# Patient Record
Sex: Female | Born: 1935 | Race: White | Hispanic: No | Marital: Married | Smoking: Former smoker
Health system: Southern US, Community
[De-identification: ages and names within clinical notes are randomized; demographics above are authoritative.]

## PROBLEM LIST (undated history)

## (undated) DIAGNOSIS — E785 Hyperlipidemia, unspecified: Secondary | ICD-10-CM

## (undated) DIAGNOSIS — D239 Other benign neoplasm of skin, unspecified: Secondary | ICD-10-CM

## (undated) DIAGNOSIS — M8580 Other specified disorders of bone density and structure, unspecified site: Secondary | ICD-10-CM

## (undated) DIAGNOSIS — M1990 Unspecified osteoarthritis, unspecified site: Secondary | ICD-10-CM

## (undated) DIAGNOSIS — Z8489 Family history of other specified conditions: Secondary | ICD-10-CM

## (undated) DIAGNOSIS — Z8719 Personal history of other diseases of the digestive system: Secondary | ICD-10-CM

## (undated) DIAGNOSIS — T7840XA Allergy, unspecified, initial encounter: Secondary | ICD-10-CM

## (undated) DIAGNOSIS — G959 Disease of spinal cord, unspecified: Secondary | ICD-10-CM

## (undated) DIAGNOSIS — I10 Essential (primary) hypertension: Secondary | ICD-10-CM

## (undated) DIAGNOSIS — F419 Anxiety disorder, unspecified: Secondary | ICD-10-CM

## (undated) DIAGNOSIS — E119 Type 2 diabetes mellitus without complications: Principal | ICD-10-CM

## (undated) DIAGNOSIS — K219 Gastro-esophageal reflux disease without esophagitis: Secondary | ICD-10-CM

## (undated) DIAGNOSIS — H409 Unspecified glaucoma: Secondary | ICD-10-CM

## (undated) HISTORY — DX: Essential (primary) hypertension: I10

## (undated) HISTORY — DX: Type 2 diabetes mellitus without complications: E11.9

## (undated) HISTORY — DX: Hyperlipidemia, unspecified: E78.5

## (undated) HISTORY — DX: Unspecified glaucoma: H40.9

## (undated) HISTORY — DX: Gastro-esophageal reflux disease without esophagitis: K21.9

## (undated) HISTORY — DX: Anxiety disorder, unspecified: F41.9

## (undated) HISTORY — DX: Allergy, unspecified, initial encounter: T78.40XA

## (undated) HISTORY — DX: Other specified disorders of bone density and structure, unspecified site: M85.80

## (undated) HISTORY — DX: Unspecified osteoarthritis, unspecified site: M19.90

## (undated) HISTORY — DX: Other benign neoplasm of skin, unspecified: D23.9

## (undated) HISTORY — PX: SPINE SURGERY: SHX786

---

## 2002-01-10 ENCOUNTER — Other Ambulatory Visit: Admission: RE | Admit: 2002-01-10 | Discharge: 2002-01-10 | Payer: Self-pay | Admitting: Internal Medicine

## 2003-09-17 ENCOUNTER — Other Ambulatory Visit: Admission: RE | Admit: 2003-09-17 | Discharge: 2003-09-17 | Payer: Self-pay | Admitting: Family Medicine

## 2005-07-16 ENCOUNTER — Other Ambulatory Visit: Admission: RE | Admit: 2005-07-16 | Discharge: 2005-07-16 | Payer: Self-pay | Admitting: Internal Medicine

## 2005-07-16 ENCOUNTER — Ambulatory Visit: Payer: Self-pay | Admitting: Internal Medicine

## 2005-09-24 ENCOUNTER — Ambulatory Visit: Payer: Self-pay | Admitting: Internal Medicine

## 2006-02-17 ENCOUNTER — Ambulatory Visit: Payer: Self-pay | Admitting: Internal Medicine

## 2006-02-19 ENCOUNTER — Encounter: Admission: RE | Admit: 2006-02-19 | Discharge: 2006-02-19 | Payer: Self-pay | Admitting: Internal Medicine

## 2006-03-08 ENCOUNTER — Ambulatory Visit: Payer: Self-pay | Admitting: Gastroenterology

## 2006-03-14 LAB — HM COLONOSCOPY: HM Colonoscopy: NORMAL

## 2006-03-25 ENCOUNTER — Ambulatory Visit: Payer: Self-pay | Admitting: Gastroenterology

## 2006-09-23 ENCOUNTER — Other Ambulatory Visit: Admission: RE | Admit: 2006-09-23 | Discharge: 2006-09-23 | Payer: Self-pay | Admitting: Family Medicine

## 2006-09-23 ENCOUNTER — Ambulatory Visit: Payer: Self-pay | Admitting: Internal Medicine

## 2006-09-23 ENCOUNTER — Encounter: Payer: Self-pay | Admitting: Internal Medicine

## 2006-09-29 ENCOUNTER — Ambulatory Visit: Payer: Self-pay | Admitting: Internal Medicine

## 2007-09-28 ENCOUNTER — Ambulatory Visit: Payer: Self-pay | Admitting: Internal Medicine

## 2007-10-04 DIAGNOSIS — E785 Hyperlipidemia, unspecified: Secondary | ICD-10-CM

## 2007-10-04 DIAGNOSIS — I10 Essential (primary) hypertension: Secondary | ICD-10-CM

## 2007-10-04 DIAGNOSIS — J309 Allergic rhinitis, unspecified: Secondary | ICD-10-CM | POA: Insufficient documentation

## 2007-10-04 DIAGNOSIS — K219 Gastro-esophageal reflux disease without esophagitis: Secondary | ICD-10-CM | POA: Insufficient documentation

## 2007-10-05 ENCOUNTER — Ambulatory Visit: Payer: Self-pay | Admitting: Internal Medicine

## 2007-10-06 ENCOUNTER — Encounter (INDEPENDENT_AMBULATORY_CARE_PROVIDER_SITE_OTHER): Payer: Self-pay | Admitting: *Deleted

## 2007-10-20 IMAGING — CR DG HIP W/ PELVIS BILAT
5 series · 5 of 5 positions shown · non-contrast
Comparison: none

CLINICAL DATA: Osteoarthritis.  Pain in the hips.  No injury.
 BILATERAL HIPS AND PELVIS:

[view not recorded (1 of 5)]
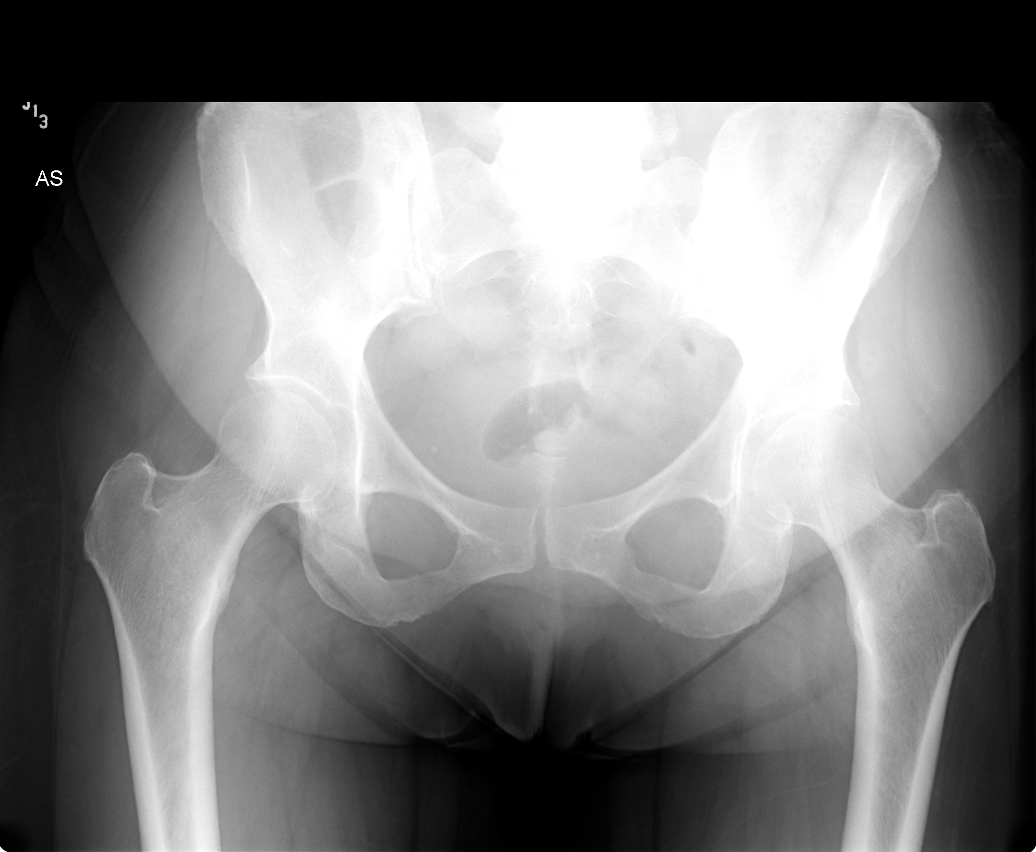

[view not recorded (2 of 5)]
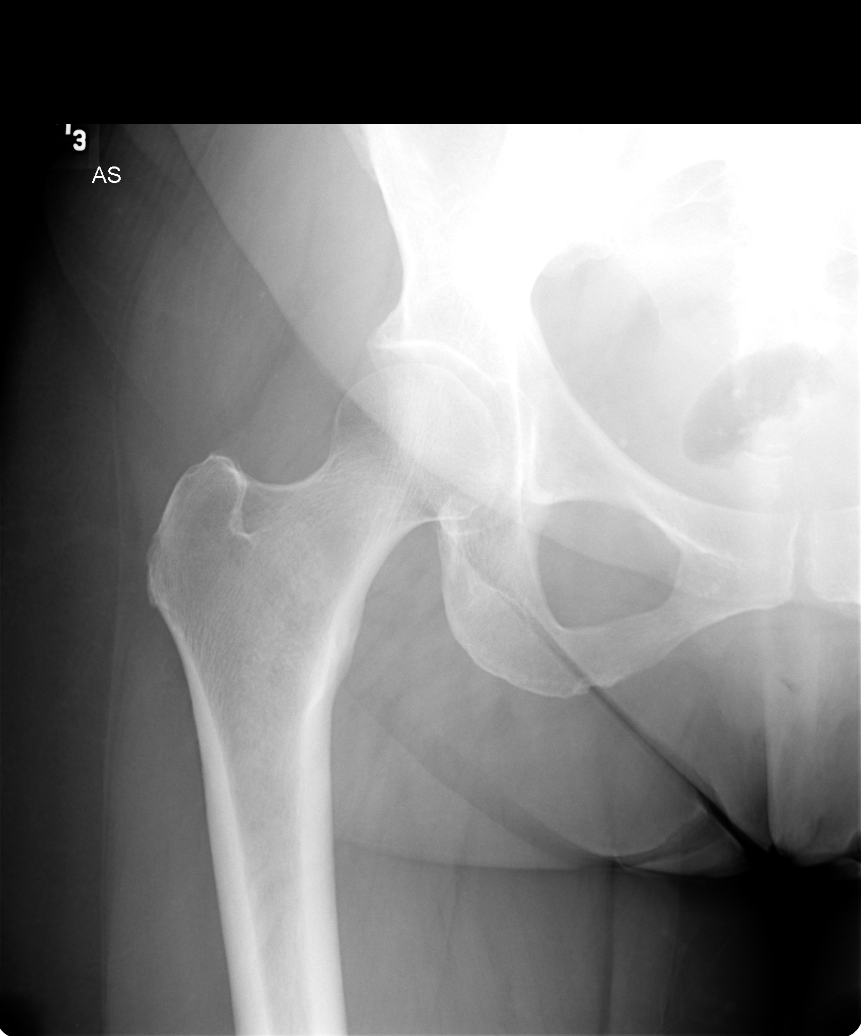

[view not recorded (3 of 5)]
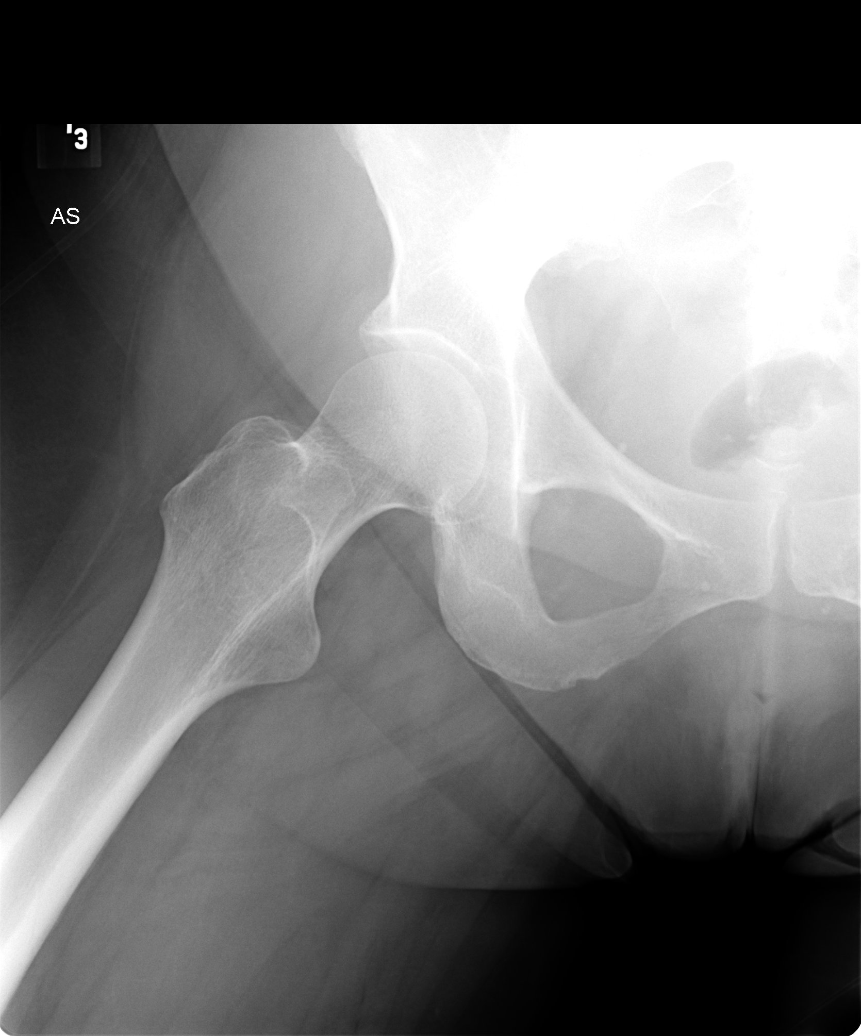

[view not recorded (4 of 5)]
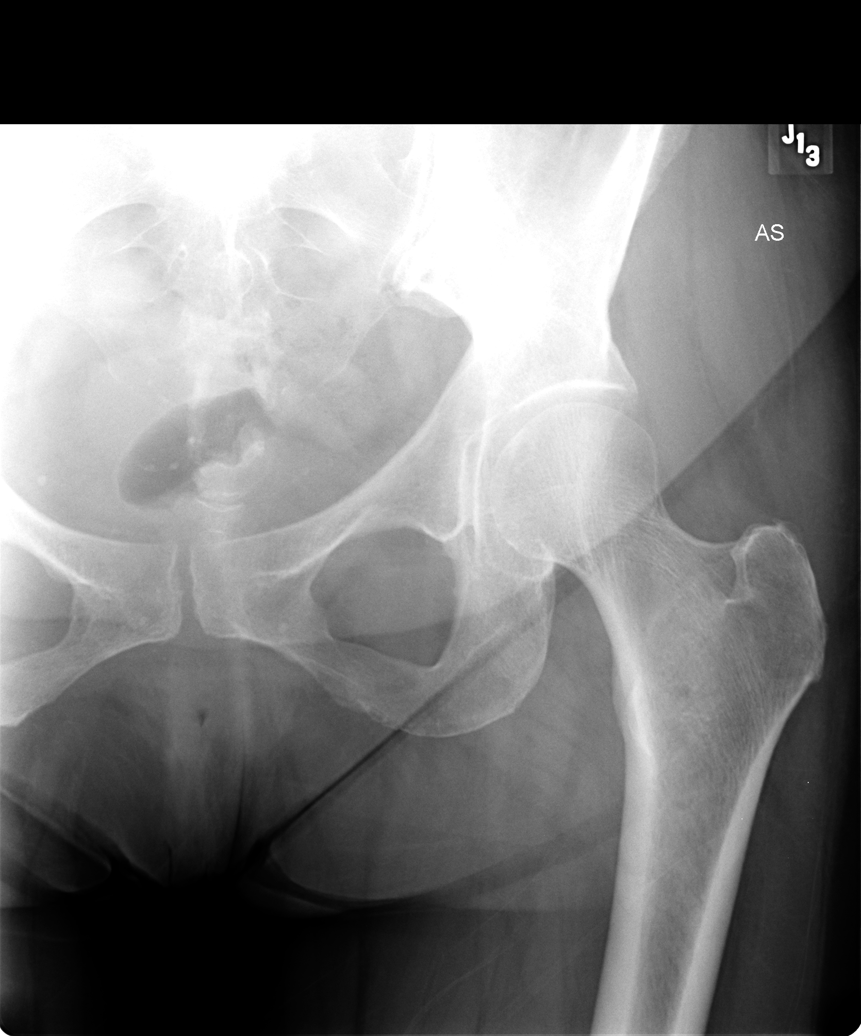

[view not recorded (5 of 5)]
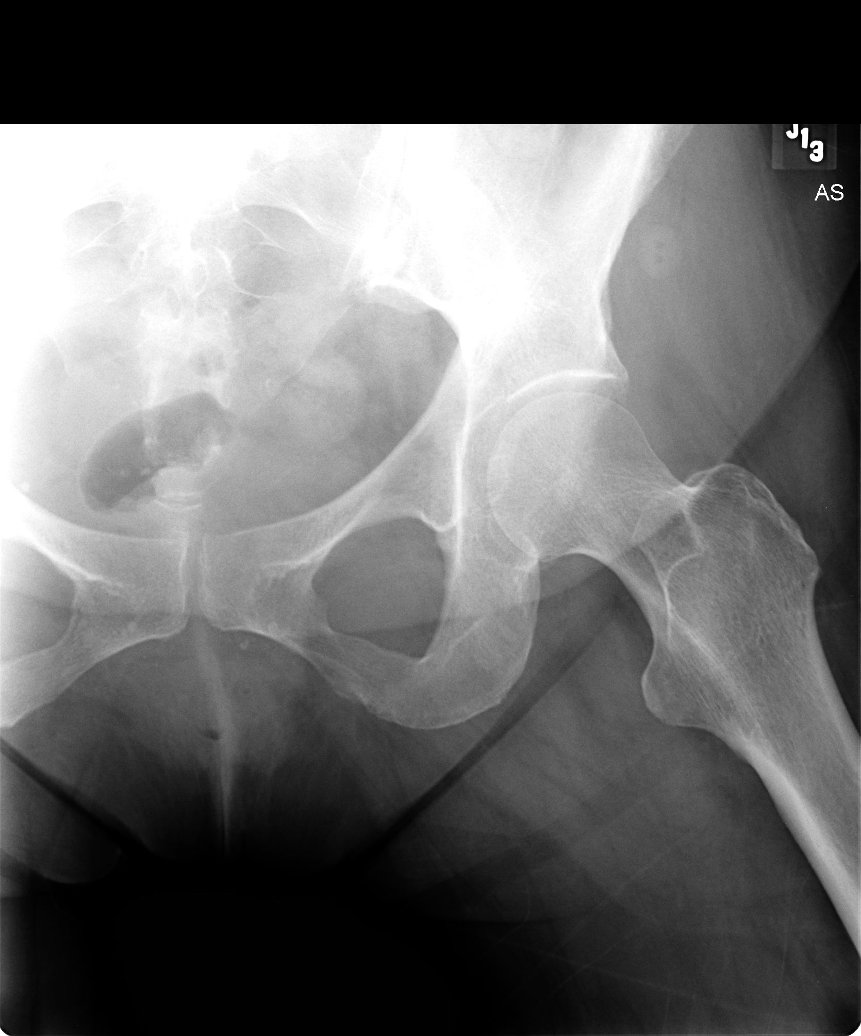

[5 of 5 positions shown; findings below may reference images not displayed]

FINDINGS: A view of the pelvis and two views of both the right and the left hip were obtained.  The hip joint spaces appear normal bilaterally.  No significant degenerative change is seen involving the hips.  The pelvic rami are intact.  The SI joints appear normal.  There does appear to be mild degenerative change in the lower lumbar spine.
IMPRESSION: Negative views of the hips.  Mild degenerative change in the lower lumbar spine.

## 2008-01-06 ENCOUNTER — Ambulatory Visit: Payer: Self-pay | Admitting: Internal Medicine

## 2008-01-10 ENCOUNTER — Encounter (INDEPENDENT_AMBULATORY_CARE_PROVIDER_SITE_OTHER): Payer: Self-pay | Admitting: *Deleted

## 2008-01-10 LAB — CONVERTED CEMR LAB: Glucose, Bld: 102 mg/dL — ABNORMAL HIGH (ref 70–99)

## 2008-01-25 ENCOUNTER — Ambulatory Visit: Payer: Self-pay | Admitting: Internal Medicine

## 2008-04-26 ENCOUNTER — Telehealth (INDEPENDENT_AMBULATORY_CARE_PROVIDER_SITE_OTHER): Payer: Self-pay | Admitting: *Deleted

## 2008-04-27 ENCOUNTER — Encounter (INDEPENDENT_AMBULATORY_CARE_PROVIDER_SITE_OTHER): Payer: Self-pay | Admitting: *Deleted

## 2008-05-02 ENCOUNTER — Encounter: Payer: Self-pay | Admitting: Internal Medicine

## 2008-05-09 ENCOUNTER — Encounter: Payer: Self-pay | Admitting: Internal Medicine

## 2008-05-16 ENCOUNTER — Telehealth (INDEPENDENT_AMBULATORY_CARE_PROVIDER_SITE_OTHER): Payer: Self-pay | Admitting: *Deleted

## 2008-05-18 ENCOUNTER — Encounter: Payer: Self-pay | Admitting: Internal Medicine

## 2008-09-26 ENCOUNTER — Ambulatory Visit: Payer: Self-pay | Admitting: Internal Medicine

## 2008-09-26 DIAGNOSIS — M1990 Unspecified osteoarthritis, unspecified site: Secondary | ICD-10-CM

## 2008-10-03 ENCOUNTER — Ambulatory Visit: Payer: Self-pay | Admitting: Internal Medicine

## 2008-11-09 ENCOUNTER — Ambulatory Visit: Payer: Self-pay | Admitting: Internal Medicine

## 2008-11-09 DIAGNOSIS — M949 Disorder of cartilage, unspecified: Secondary | ICD-10-CM

## 2008-11-09 DIAGNOSIS — M899 Disorder of bone, unspecified: Secondary | ICD-10-CM | POA: Insufficient documentation

## 2008-11-13 ENCOUNTER — Ambulatory Visit: Payer: Self-pay | Admitting: Internal Medicine

## 2008-11-16 ENCOUNTER — Telehealth (INDEPENDENT_AMBULATORY_CARE_PROVIDER_SITE_OTHER): Payer: Self-pay | Admitting: *Deleted

## 2008-11-16 LAB — CONVERTED CEMR LAB
BUN: 16 mg/dL (ref 6–23)
Basophils Relative: 0.3 % (ref 0.0–3.0)
Creatinine, Ser: 0.9 mg/dL (ref 0.4–1.2)
Eosinophils Relative: 6.9 % — ABNORMAL HIGH (ref 0.0–5.0)
GFR calc Af Amer: 80 mL/min
Glucose, Bld: 103 mg/dL — ABNORMAL HIGH (ref 70–99)
HCT: 41.3 % (ref 36.0–46.0)
HDL: 48.1 mg/dL (ref >39.0)
Hemoglobin: 14.3 g/dL (ref 12.0–15.0)
Monocytes Absolute: 0.5 10*3/uL (ref 0.1–1.0)
Monocytes Relative: 7.7 % (ref 3.0–12.0)
Platelets: 235 10*3/uL (ref 150–400)
Potassium: 3.5 meq/L (ref 3.5–5.1)
RBC: 4.67 M/uL (ref 3.87–5.11)
Total CHOL/HDL Ratio: 3.5
Vit D, 1,25-Dihydroxy: 51 (ref 30–89)
WBC: 7 10*3/uL (ref 4.5–10.5)

## 2008-11-23 ENCOUNTER — Encounter: Payer: Self-pay | Admitting: Internal Medicine

## 2009-05-09 ENCOUNTER — Ambulatory Visit: Payer: Self-pay | Admitting: Internal Medicine

## 2009-05-17 ENCOUNTER — Encounter: Payer: Self-pay | Admitting: Internal Medicine

## 2009-05-27 ENCOUNTER — Encounter: Payer: Self-pay | Admitting: Internal Medicine

## 2009-06-03 ENCOUNTER — Ambulatory Visit: Payer: Self-pay | Admitting: Internal Medicine

## 2009-06-07 ENCOUNTER — Ambulatory Visit: Payer: Self-pay | Admitting: Internal Medicine

## 2009-06-11 ENCOUNTER — Telehealth: Payer: Self-pay | Admitting: Internal Medicine

## 2009-09-11 ENCOUNTER — Ambulatory Visit: Payer: Self-pay | Admitting: Internal Medicine

## 2009-11-11 ENCOUNTER — Ambulatory Visit: Payer: Self-pay | Admitting: Internal Medicine

## 2009-11-12 ENCOUNTER — Telehealth (INDEPENDENT_AMBULATORY_CARE_PROVIDER_SITE_OTHER): Payer: Self-pay | Admitting: *Deleted

## 2009-11-13 LAB — CONVERTED CEMR LAB
ALT: 22 units/L (ref 0–35)
Basophils Relative: 1.2 % (ref 0.0–3.0)
Eosinophils Relative: 8.2 % — ABNORMAL HIGH (ref 0.0–5.0)
GFR calc non Af Amer: 58.08 mL/min (ref >60)
HCT: 44 % (ref 36.0–46.0)
HDL: 54.4 mg/dL (ref >39.00)
Hemoglobin: 14.9 g/dL (ref 12.0–15.0)
LDL Cholesterol: 93 mg/dL (ref 0–99)
Lymphs Abs: 2.6 10*3/uL (ref 0.7–4.0)
Monocytes Relative: 7.5 % (ref 3.0–12.0)
Neutro Abs: 3 10*3/uL (ref 1.4–7.7)
Potassium: 5 meq/L (ref 3.5–5.1)
Sodium: 144 meq/L (ref 135–145)
Total CHOL/HDL Ratio: 3
VLDL: 24.2 mg/dL (ref 0.0–40.0)
WBC: 6.8 10*3/uL (ref 4.5–10.5)

## 2009-11-15 ENCOUNTER — Encounter: Payer: Self-pay | Admitting: Internal Medicine

## 2009-11-18 ENCOUNTER — Ambulatory Visit: Payer: Self-pay

## 2009-11-18 ENCOUNTER — Encounter: Payer: Self-pay | Admitting: Internal Medicine

## 2009-12-25 ENCOUNTER — Telehealth (INDEPENDENT_AMBULATORY_CARE_PROVIDER_SITE_OTHER): Payer: Self-pay | Admitting: *Deleted

## 2010-05-13 ENCOUNTER — Ambulatory Visit: Payer: Self-pay | Admitting: Internal Medicine

## 2010-05-14 LAB — HM MAMMOGRAPHY: HM Mammogram: NORMAL

## 2010-05-15 LAB — CONVERTED CEMR LAB
Calcium: 9.8 mg/dL (ref 8.4–10.5)
GFR calc non Af Amer: 69.96 mL/min (ref >60)
Glucose, Bld: 88 mg/dL (ref 70–99)
Sodium: 142 meq/L (ref 135–145)

## 2010-09-16 ENCOUNTER — Ambulatory Visit: Payer: Self-pay | Admitting: Internal Medicine

## 2010-10-14 ENCOUNTER — Ambulatory Visit: Payer: Self-pay | Admitting: Internal Medicine

## 2010-10-14 LAB — CONVERTED CEMR LAB: Vit D, 25-Hydroxy: 81 ng/mL (ref 30–89)

## 2010-10-15 LAB — CONVERTED CEMR LAB
AST: 25 units/L (ref 0–37)
BUN: 12 mg/dL (ref 6–23)
Basophils Relative: 0.7 % (ref 0.0–3.0)
CO2: 32 meq/L (ref 19–32)
Chloride: 108 meq/L (ref 96–112)
Creatinine, Ser: 0.8 mg/dL (ref 0.4–1.2)
Eosinophils Absolute: 0.6 10*3/uL (ref 0.0–0.7)
Eosinophils Relative: 7.2 % — ABNORMAL HIGH (ref 0.0–5.0)
HCT: 43.4 % (ref 36.0–46.0)
HDL: 53.8 mg/dL (ref >39.00)
Lymphs Abs: 2.2 10*3/uL (ref 0.7–4.0)
MCHC: 33.9 g/dL (ref 30.0–36.0)
MCV: 89 fL (ref 78.0–100.0)
Monocytes Absolute: 0.6 10*3/uL (ref 0.1–1.0)
Neutrophils Relative %: 58.9 % (ref 43.0–77.0)
Platelets: 246 10*3/uL (ref 150.0–400.0)
Potassium: 5.1 meq/L (ref 3.5–5.1)
RBC: 4.88 M/uL (ref 3.87–5.11)
Total CHOL/HDL Ratio: 4
VLDL: 31.8 mg/dL (ref 0.0–40.0)
WBC: 8.3 10*3/uL (ref 4.5–10.5)

## 2011-01-11 LAB — CONVERTED CEMR LAB
ALT: 25 units/L (ref 0–35)
AST: 25 units/L (ref 0–37)
BUN: 13 mg/dL (ref 6–23)
Basophils Absolute: 0 10*3/uL (ref 0.0–0.1)
Calcium: 9.8 mg/dL (ref 8.4–10.5)
Chloride: 106 meq/L (ref 96–112)
Eosinophils Absolute: 0.5 10*3/uL (ref 0.0–0.6)
GFR calc Af Amer: 71 mL/min
GFR calc non Af Amer: 59 mL/min
HDL: 46.9 mg/dL (ref >39.0)
Lymphocytes Relative: 31.6 % (ref 12.0–46.0)
MCV: 88.5 fL (ref 78.0–100.0)
Monocytes Relative: 7.2 % (ref 3.0–11.0)
Neutro Abs: 4.3 10*3/uL (ref 1.4–7.7)
Platelets: 259 10*3/uL (ref 150–400)
RBC: 4.84 M/uL (ref 3.87–5.11)
TSH: 1.09 microintl units/mL (ref 0.35–5.50)
Total CHOL/HDL Ratio: 3.3
Triglycerides: 176 mg/dL — ABNORMAL HIGH (ref 0–149)
WBC: 7.9 10*3/uL (ref 4.5–10.5)

## 2011-01-13 NOTE — Assessment & Plan Note (Signed)
Summary: FLU SHOT//PH  Nurse Visit  CC: Flu shot./kb   Allergies: 1)  ! Bactrim Ds (Sulfamethoxazole-Trimethoprim)  Orders Added: 1)  Flu Vaccine 43yrs + MEDICARE PATIENTS [Q2039] 2)  Administration Flu vaccine - MCR [G0008]            Flu Vaccine Consent Questions     Do you have a history of severe allergic reactions to this vaccine? no    Any prior history of allergic reactions to egg and/or gelatin? no    Do you have a sensitivity to the preservative Thimersol? no    Do you have a past history of Guillan-Barre Syndrome? no    Do you currently have an acute febrile illness? no    Have you ever had a severe reaction to latex? no    Vaccine information given and explained to patient? yes    Are you currently pregnant? no    Lot Number:[id number]   Exp Date:06/13/2011   Site Given  Left Deltoid IMu

## 2011-01-13 NOTE — Assessment & Plan Note (Signed)
Summary: 6 MONTH FOLLOWUP///SPH   Vital Signs:  Patient profile:   75 year old female Height:      60 inches Weight:      137.8 pounds BMI:     27.01 Pulse rate:   82 / minute BP sitting:   140 / 80  Vitals Entered By: Shary Decamp (May 13, 2010 10:46 AM) CC: rov, not fasting   History of Present Illness: routine office visit saw gyn recently, PAP neg  due for MMG and DEXA @ [facility]     Current Medications (verified): 1)  Zocor 80 Mg  Tabs (Simvastatin) .... Take One Tablet Daily 2)  Protonix 40 Mg  Tbec (Pantoprazole Sodium) .... Take One Tablet Daily 3)  Lisinopril-Hydrochlorothiazide 10-12.5 Mg  Tabs (Lisinopril-Hydrochlorothiazide) .... Take One Tablet Daily 4)  Mvi 5)  Glucosamine 6)  Cal/d 7)  Bayer Low Strength 81 Mg Tbec (Aspirin) .... Once Daily  Allergies (verified): 1)  ! Bactrim Ds (Sulfamethoxazole-Trimethoprim)  Past History:  Past Medical History: Hyperlipidemia Hypertension Osteoarthritis GERD Osteopenia 11/2009 AORTA U/S  no AAA  Social History: Reviewed history from 11/11/2009 and no changes required. Married 3 children Retired exercise + daily, treadmill, wt 3 times a week  diet-- healthy  Review of Systems       Hyperlipidemia-- good medication compliance, no myalgias  Hypertension-- ambulatory BPs good  < 140 Osteopenia -- good medication compliance w/ ca and vit d GERD-- no heartburn    Physical Exam  General:  alert, well-developed, and well-nourished.   Lungs:  normal respiratory effort, no intercostal retractions, no accessory muscle use, and normal breath sounds.   Heart:  normal rate, regular rhythm, and no murmur.   Extremities:  no pretibial edema bilaterally    Impression & Recommendations:  Problem # 1:  OSTEOPENIA (ICD-733.90)   due for a bone density test see #2  Orders: Radiology Referral (Radiology)  Problem # 2:  HEALTH SCREENING (ICD-V70.0)   will schedule MMG and DEXA  the same day at [facility]   Orders: Radiology Referral (Radiology)  Problem # 3:  GERD (ICD-530.81) well controlled x years, while I'm refilling protonix , I advise a trial zantac  see instructions  Her updated medication list for this problem includes:    Protonix 40 Mg Tbec (Pantoprazole sodium) .Marland Kitchen... Take one tablet daily    Ranitidine Hcl 300 Mg Tabs (Ranitidine hcl) .Marland Kitchen... 1 a day  Problem # 4:  HYPERTENSION (ICD-401.9) no change  RF meds  Her updated medication list for this problem includes:    Lisinopril-hydrochlorothiazide 10-12.5 Mg Tabs (Lisinopril-hydrochlorothiazide) .Marland Kitchen... Take one tablet daily  BP today: 140/80 Prior BP: 118/70 (11/11/2009)  Labs Reviewed: K+: 5.0 (11/11/2009) Creat: : 1.0 (11/11/2009)   Chol: 172 (11/11/2009)   HDL: 54.40 (11/11/2009)   LDL: 93 (11/11/2009)   TG: 121.0 (11/11/2009)  Orders: Venipuncture (16109) TLB-BMP (Basic Metabolic Panel-BMET) (80048-METABOL)  Problem # 5:  HYPERLIPIDEMIA (ICD-272.4) no change  Her updated medication list for this problem includes:    Zocor 80 Mg Tabs (Simvastatin) .Marland Kitchen... Take one tablet daily  Labs Reviewed: SGOT: 24 (11/11/2009)   SGPT: 22 (11/11/2009)   HDL:54.40 (11/11/2009), 48.1 (11/13/2008)  LDL:93 (11/11/2009), 90 (11/13/2008)  Chol:172 (11/11/2009), 167 (11/13/2008)  Trig:121.0 (11/11/2009), 144 (11/13/2008)  Complete Medication List: 1)  Zocor 80 Mg Tabs (Simvastatin) .... Take one tablet daily 2)  Protonix 40 Mg Tbec (Pantoprazole sodium) .... Take one tablet daily 3)  Lisinopril-hydrochlorothiazide 10-12.5 Mg Tabs (Lisinopril-hydrochlorothiazide) .... Take one  tablet daily 4)  Mvi  5)  Glucosamine  6)  Cal/d  7)  Bayer Low Strength 81 Mg Tbec (Aspirin) .... Once daily 8)  Ranitidine Hcl 300 Mg Tabs (Ranitidine hcl) .Marland Kitchen.. 1 a day  Patient Instructions: 1)  Please schedule a follow-up appointment in 6 months  (yearly) 2)  hold protonix, try ranitidine. If it works as good, stay on it for now   Prescriptions: PROTONIX 40 MG  TBEC (PANTOPRAZOLE SODIUM) take one tablet daily Brand medically necessary #90 x 3   Entered by:   Shary Decamp   Authorized by:   Nolon Rod. Claudie Rathbone MD   Signed by:   Shary Decamp on 05/13/2010   Method used:   Electronically to        [facility]* (mail-order)             ,          Ph: [phone]       Fax: [phone]   RxID:   [id number] LISINOPRIL-HYDROCHLOROTHIAZIDE 10-12.5 MG  TABS (LISINOPRIL-HYDROCHLOROTHIAZIDE) take one tablet daily  #90 x 3   Entered by:   Shary Decamp   Authorized by:   Nolon Rod. Noralee Dutko MD   Signed by:   Shary Decamp on 05/13/2010   Method used:   Electronically to        [facility]* (mail-order)             ,          Ph: [phone]       Fax: [phone]   RxID:   [id number] RANITIDINE HCL 300 MG TABS (RANITIDINE HCL) 1 a day  #30 x 6   Entered and Authorized by:   Nolon Rod. Laporcha Marchesi MD   Signed by:   Nolon Rod. Vivienne Sangiovanni MD on 05/13/2010   Method used:   Print then Give to Patient   RxID:   [id number]

## 2011-01-13 NOTE — Progress Notes (Signed)
  Phone Note Other Incoming   Caller: [facility] Summary of Call: requesting a change from generic protonix to name brand to save ?? new rx sent to [facility] for name brand Shary Decamp  December 25, 2009 5:09 PM     Prescriptions: PROTONIX 40 MG  TBEC (PANTOPRAZOLE SODIUM) take one tablet daily Brand medically necessary #90 x 1   Entered by:   Shary Decamp   Authorized by:   Nolon Rod. Paz MD   Signed by:   Shary Decamp on 12/25/2009   Method used:   Electronically to        [facility]* (mail-order)             ,          Ph: [phone]       Fax: [phone]   RxID:   [id number]

## 2011-01-13 NOTE — Assessment & Plan Note (Signed)
Summary: yearly check - lab/cbs   Vital Signs:  Patient profile:   75 year old female Height:      60 inches Weight:      137.13 pounds Pulse rate:   76 / minute Pulse rhythm:   regular BP sitting:   124 / 82  (left arm) Cuff size:   regular  Vitals Entered By: Army Fossa CMA (October 14, 2010 8:00 AM) CC: CPX, fasting Comments [facility]   History of Present Illness: Here for Medicare AWV: 1.   Risk factors based on Past M, S, F history: reviewed  2.   Physical Activities: yard work, walks the treadmil 3.   Depression/mood: denies problems, no problems noted today  4.   Hearing: no problems noted w/  normal converstaion, denies problems  5.   ADL's: totally independent 6.   Fall Risk: low risk, no recent falls 7.   Home Safety: does feel safe at home 8.   Height, weight, &visual acuity: see VS, has cataracts, stable x years  9.   Counseling: see assessment and plan 10.   Labs ordered based on risk factors: yes  11.           Referral Coordination, if needed 12.           Care Plan, see a/p  13.            Cognitive Assessment, motor skills, memory and cognition seemed intact  In addition, we discussed the following  Hyperlipidemia-- good medication compliance , no myalgias per se  Hypertension-- ambulatory BPs , good medication compliance  Osteoarthritis-- had knee pain, much better since she lost wt  GERD-- unable to tolerate ranitidine d/t increae GERD symptoms, back on PPIs  Osteopenia-- good medication compliance w/ ca and vit D     Preventive Screening-Counseling & Management  Alcohol-Tobacco     Alcohol type: wine  Caffeine-Diet-Exercise     Does Patient Exercise: yes     Times/week: 3  Current Medications (verified): 1)  Zocor 80 Mg  Tabs (Simvastatin) .... Take One Tablet Daily 2)  Protonix 40 Mg  Tbec (Pantoprazole Sodium) .... Take One Tablet Daily 3)  Lisinopril-Hydrochlorothiazide 10-12.5 Mg  Tabs (Lisinopril-Hydrochlorothiazide) .... Take One  Tablet Daily 4)  Mvi 5)  Glucosamine 6)  Cal/d 7)  Bayer Low Strength 81 Mg Tbec (Aspirin) .... Once Daily  Allergies (verified): 1)  ! Bactrim Ds (Sulfamethoxazole-Trimethoprim)  Past History:  Past Medical History: Reviewed history from 05/13/2010 and no changes required. Hyperlipidemia Hypertension Osteoarthritis GERD Osteopenia 11/2009 AORTA U/S  no AAA  Past Surgical History: Reviewed history from 11/09/2008 and no changes required. no  Family History: Reviewed history from 11/09/2008 and no changes required. colon ca-- no breast ca-- aunt DM-- F, M, daughter MI-- mother age 18  Social History: Married 3 children Retired exercise + daily, treadmill, wt 3 times a week  diet-- healthy quit tobaco in the 80s, never heavy  ETOH-- socially  Does Patient Exercise:  yes  Review of Systems General:  Denies fatigue, fever, and weight loss. CV:  Denies chest pain or discomfort and swelling of feet. Resp:  Denies cough and shortness of breath. GI:  Denies bloody stools, diarrhea, nausea, and vomiting. GU:  Denies dysuria and hematuria.  Physical Exam  General:  alert, well-developed, and well-nourished.   Neck:  no masses, no thyromegaly, and normal carotid upstroke.   Lungs:  normal respiratory effort, no intercostal retractions, no accessory muscle use, and normal  breath sounds.   Heart:  normal rate, regular rhythm, no murmur, and no gallop.   Abdomen:  soft, non-tender, no distention, no guarding, and no rigidity.     Extremities:  no pretibial edema bilaterally  Psych:  Oriented X3, memory intact for recent and remote, normally interactive, good eye contact, not anxious appearing, and not depressed appearing.     Impression & Recommendations:  Problem # 1:  HEALTH SCREENING (ICD-V70.0)  Td  10-2009 Pneumonia shot 2007 had shingles shot  had a flu shot already   female care PAP: per gyn MMG -- had one this year? will check w/  Sheela Stack  2003 and 03-2006, next 2017  diet and exercise discussed  Orders: Medicare -1st Annual Wellness Visit [phone])  Problem # 2:  OSTEOPENIA (ICD-733.90) had DEXA done this year? will  call Yolanda Bonine continue with calcium and vitamin D Orders: T-Vitamin D (25-Hydroxy) (19147-82956)  Problem # 3:  OSTEOARTHRITIS (ICD-715.90) symptoms better since she lost weight Her updated medication list for this problem includes:    Bayer Low Strength 81 Mg Tbec (Aspirin) ..... Once daily  Problem # 4:  GERD (ICD-530.81) symptoms resurface when she tried ranitidine,  back on PPIs and doing well The following medications were removed from the medication list:    Ranitidine Hcl 300 Mg Tabs (Ranitidine hcl) .Marland Kitchen... 1 a day Her updated medication list for this problem includes:    Protonix 40 Mg Tbec (Pantoprazole sodium) .Marland Kitchen... Take one tablet daily  Problem # 5:  HYPERTENSION (ICD-401.9) at goal  Her updated medication list for this problem includes:    Lisinopril-hydrochlorothiazide 10-12.5 Mg Tabs (Lisinopril-hydrochlorothiazide) .Marland Kitchen... Take one tablet daily  Orders: Venipuncture (21308) TLB-BMP (Basic Metabolic Panel-BMET) (80048-METABOL) TLB-CBC Platelet - w/Differential (85025-CBCD)  BP today: 124/82 Prior BP: 140/80 (05/13/2010)  Labs Reviewed: K+: 3.9 (05/13/2010) Creat: : 0.9 (05/13/2010)   Chol: 172 (11/11/2009)   HDL: 54.40 (11/11/2009)   LDL: 93 (11/11/2009)   TG: 121.0 (11/11/2009)  Problem # 6:  HYPERLIPIDEMIA (ICD-272.4) labs  made pt aware  that latest research show increased on myopathy with high doses of Zocor, however she has been on high doses of Zocor for years without problems  LDL goal around 100, consider decrease the dose of Zocor if appropriate. Her updated medication list for this problem includes:    Zocor 80 Mg Tabs (Simvastatin) .Marland Kitchen... Take one tablet daily  Orders: TLB-ALT (SGPT) (84460-ALT) TLB-AST (SGOT) (84450-SGOT) TLB-Lipid Panel (80061-LIPID)  Complete  Medication List: 1)  Zocor 80 Mg Tabs (Simvastatin) .... Take one tablet daily 2)  Protonix 40 Mg Tbec (Pantoprazole sodium) .... Take one tablet daily 3)  Lisinopril-hydrochlorothiazide 10-12.5 Mg Tabs (Lisinopril-hydrochlorothiazide) .... Take one tablet daily 4)  Mvi  5)  Glucosamine  6)  Cal/d  7)  Bayer Low Strength 81 Mg Tbec (Aspirin) .... Once daily  Patient Instructions: 1)  Please schedule a follow-up appointment in 6 months .    Orders Added: 1)  Venipuncture [36415] 2)  TLB-BMP (Basic Metabolic Panel-BMET) [80048-METABOL] 3)  TLB-CBC Platelet - w/Differential [85025-CBCD] 4)  TLB-ALT (SGPT) [84460-ALT] 5)  TLB-AST (SGOT) [84450-SGOT] 6)  TLB-Lipid Panel [80061-LIPID] 7)  T-Vitamin D (25-Hydroxy) [65784-69629] 8)  Est. Patient Level III [52841] 9)  Medicare -1st Annual Wellness Visit [G0438]     Risk Factors:     Type:  wine Exercise:  yes    Times per week:  3  Mammogram History:     Date of Last Mammogram:  05/14/2010  Results:  normal- per pt   PAP Smear History:     Date of Last PAP Smear:  02/11/2010    Results:  normal-per pt     Preventive Care Screening  Mammogram:    Date:  05/14/2010    Results:  normal- per pt   Pap Smear:    Date:  02/11/2010    Results:  normal-per pt

## 2011-02-20 ENCOUNTER — Encounter: Payer: Self-pay | Admitting: Internal Medicine

## 2011-04-14 ENCOUNTER — Ambulatory Visit (INDEPENDENT_AMBULATORY_CARE_PROVIDER_SITE_OTHER): Payer: Medicare Other | Admitting: Internal Medicine

## 2011-04-14 ENCOUNTER — Encounter: Payer: Self-pay | Admitting: Internal Medicine

## 2011-04-14 DIAGNOSIS — E785 Hyperlipidemia, unspecified: Secondary | ICD-10-CM

## 2011-04-14 DIAGNOSIS — M949 Disorder of cartilage, unspecified: Secondary | ICD-10-CM

## 2011-04-14 DIAGNOSIS — G4700 Insomnia, unspecified: Secondary | ICD-10-CM

## 2011-04-14 DIAGNOSIS — M1990 Unspecified osteoarthritis, unspecified site: Secondary | ICD-10-CM

## 2011-04-14 DIAGNOSIS — M899 Disorder of bone, unspecified: Secondary | ICD-10-CM

## 2011-04-14 DIAGNOSIS — I10 Essential (primary) hypertension: Secondary | ICD-10-CM

## 2011-04-14 LAB — BASIC METABOLIC PANEL
CO2: 26 mEq/L (ref 19–32)
Calcium: 9.5 mg/dL (ref 8.4–10.5)
Creatinine, Ser: 0.9 mg/dL (ref 0.4–1.2)
Glucose, Bld: 92 mg/dL (ref 70–99)

## 2011-04-14 NOTE — Progress Notes (Signed)
  Subjective:    Patient ID: Monique Bauer, female    DOB: 04-07-1936, 75 y.o.   MRN: [id number]  HPI  Routine office visit, doing well. Her diet has improved, she is exercising more, trying to walk 30 minutes daily  Past Medical History  Diagnosis Date  . Screening for AAA (abdominal aortic aneurysm) 11/2009    aorta U/S, no AAA  . Hyperlipidemia   . Hypertension   . Osteoarthritis   . GERD (gastroesophageal reflux disease)   . Osteopenia    No past surgical history on file.    Review of Systems No chest pain or shortness of breath. No lower extremity edema. GERD symptoms well controlled, is avoiding foods that cause GERD.     Objective:   Physical Exam  Constitutional: She is oriented to person, place, and time. She appears well-developed and well-nourished. No distress.  HENT:  Head: Normocephalic and atraumatic.  Cardiovascular: Normal rate, regular rhythm and normal heart sounds.   No murmur heard. Pulmonary/Chest: Effort normal and breath sounds normal. No respiratory distress. She has no wheezes. She has no rales.  Musculoskeletal: She exhibits no edema.  Neurological: She is alert and oriented to person, place, and time.  Psychiatric: She has a normal mood and affect. Her behavior is normal. Judgment and thought content normal.          Assessment & Plan:

## 2011-04-14 NOTE — Assessment & Plan Note (Signed)
At goal.  

## 2011-04-14 NOTE — Assessment & Plan Note (Signed)
Life style improved! Labs on RTC

## 2011-04-14 NOTE — Assessment & Plan Note (Signed)
Well controlled on OTCs 

## 2011-04-14 NOTE — Assessment & Plan Note (Signed)
occ difficulty sleeping . Tips for healthy sleep provided Melatonin OTC? Will call if Rx needed, plans to visit [place], may need xanax or [place]

## 2011-04-14 NOTE — Assessment & Plan Note (Signed)
H/o osteopenia but DEXA 05-2010: normal Re asses DEXA in 2 years

## 2011-04-14 NOTE — Patient Instructions (Signed)
OTC melatonin

## 2011-04-16 ENCOUNTER — Telehealth: Payer: Self-pay | Admitting: *Deleted

## 2011-04-16 NOTE — Telephone Encounter (Signed)
Message copied by Army Fossa on Thu Apr 16, 2011 10:26 AM ------      Message from: Willow Ora      Created: Thu Apr 16, 2011 10:11 AM       Advise patient:      Good results

## 2011-04-16 NOTE — Telephone Encounter (Signed)
I spoke w/ pt he is aware.  

## 2011-04-16 NOTE — Telephone Encounter (Signed)
Message left for patient to return my call.  

## 2011-06-02 ENCOUNTER — Other Ambulatory Visit: Payer: Self-pay | Admitting: Internal Medicine

## 2011-06-19 ENCOUNTER — Encounter: Payer: Self-pay | Admitting: Internal Medicine

## 2011-07-20 ENCOUNTER — Ambulatory Visit (INDEPENDENT_AMBULATORY_CARE_PROVIDER_SITE_OTHER): Payer: Medicare Other | Admitting: Internal Medicine

## 2011-07-20 ENCOUNTER — Encounter: Payer: Self-pay | Admitting: Internal Medicine

## 2011-07-20 VITALS — BP 110/80 | HR 66 | Wt 145.6 lb

## 2011-07-20 DIAGNOSIS — F419 Anxiety disorder, unspecified: Secondary | ICD-10-CM

## 2011-07-20 DIAGNOSIS — F411 Generalized anxiety disorder: Secondary | ICD-10-CM

## 2011-07-20 MED ORDER — ESCITALOPRAM OXALATE 10 MG PO TABS: 10.0000 mg | ORAL_TABLET | Freq: Every day | ORAL | Status: DC

## 2011-07-20 MED ORDER — ALPRAZOLAM 0.25 MG PO TABS: 0.2500 mg | ORAL_TABLET | Freq: Three times a day (TID) | ORAL | Status: DC | PRN

## 2011-07-20 NOTE — Progress Notes (Signed)
  Subjective:    Patient ID: Monique Bauer, female    DOB: 1936-03-17, 75 y.o.   MRN: [id number]  HPI Having "panic attacks" for the last 3 weeks. Episodes are described as feeling anxious, sweaty palms, dizziness, like things are "closing in" on her. Episodes usually happen when she is quiet, she is okay as long as she is busy. Episodes stop as soon as she walks away from where she is, for instance from church or from a store. Episodes happen 3 times a week approximately  She has a number of issues related to her daughter, recently worse, apparently they are not in good  Terms at all. She reports severe anxiety and depression many years ago when she got divorced for the first time and then 20 years ago when she took care  of her ill father.  Past Medical History  Diagnosis Date  . Screening for AAA (abdominal aortic aneurysm) 11/2009    aorta U/S, no AAA  . Hyperlipidemia   . Hypertension   . Osteoarthritis   . GERD (gastroesophageal reflux disease)   . Osteopenia   . Anxiety     No past surgical history on file.  Review of Systems Between attacks she feels okay, no depression or suicidal ideas. Her relationship with her husband is very healthy. Denies any chest pain or shortness of breath She had mild palpitations and feels a slightly flushed whenever she has a panic attack, her blood pressure has been checked while symptomatic and it was okay. Had a mild headache only during the last episode.     Objective:   Physical Exam  Constitutional: She is oriented to person, place, and time. She appears well-developed and well-nourished. No distress.  Neck: No thyromegaly present.  Cardiovascular: Normal rate, regular rhythm and normal heart sounds.   No murmur heard. Pulmonary/Chest: Effort normal. No respiratory distress. She has no wheezes. She has rales.  Musculoskeletal: She exhibits no edema.  Neurological: She is alert and oriented to person, place, and time.  Skin: Skin  is warm and dry. She is not diaphoretic.  Psychiatric: She has a normal mood and affect. Her behavior is normal. Judgment and thought content normal.          Assessment & Plan:

## 2011-07-20 NOTE — Assessment & Plan Note (Signed)
Symptoms are most likely related to panic attacks in the setting of a recent deterioration of her relationship with her daughter. Patient is counseled today. I will treat her for anxiety with Lexapro and Xanax (Low dose). Warned about side effects including somnolence. Recommend counseling if she feels she needs it however she has been dealing with her daughter's issues for more than 10 years and has been in counseled before. If she needs a stronger Xanax dose she will let me know. She knows to call me if she gets worse or she developed different symptoms, if that is the case she will need further workup to rule out heart disease, arrhythmia, pheochromocytoma, etc. Today , I spent more than 25 min with the patient, >50% of the time counseling

## 2011-08-18 ENCOUNTER — Other Ambulatory Visit: Payer: Self-pay | Admitting: Internal Medicine

## 2011-08-18 MED ORDER — ALPRAZOLAM 0.25 MG PO TABS: 0.2500 mg | ORAL_TABLET | Freq: Three times a day (TID) | ORAL | Status: DC | PRN

## 2011-08-18 NOTE — Telephone Encounter (Signed)
Ok  #60, no refills 

## 2011-08-18 NOTE — Telephone Encounter (Signed)
Rx Done . 

## 2011-08-18 NOTE — Telephone Encounter (Signed)
Alprazolam request [last refill 07/20/11 #[id number]]

## 2011-09-08 ENCOUNTER — Ambulatory Visit (INDEPENDENT_AMBULATORY_CARE_PROVIDER_SITE_OTHER): Payer: Medicare Other | Admitting: Internal Medicine

## 2011-09-08 ENCOUNTER — Encounter: Payer: Self-pay | Admitting: Internal Medicine

## 2011-09-08 DIAGNOSIS — F411 Generalized anxiety disorder: Secondary | ICD-10-CM

## 2011-09-08 DIAGNOSIS — A09 Infectious gastroenteritis and colitis, unspecified: Secondary | ICD-10-CM | POA: Insufficient documentation

## 2011-09-08 DIAGNOSIS — F419 Anxiety disorder, unspecified: Secondary | ICD-10-CM

## 2011-09-08 MED ORDER — CIPROFLOXACIN HCL 500 MG PO TABS: 500.0000 mg | ORAL_TABLET | Freq: Two times a day (BID) | ORAL | Status: AC

## 2011-09-08 NOTE — Assessment & Plan Note (Addendum)
New problem, symptoms consistent with traveler's diarrhea. Information provided, see  instructions. Start Cipro for 3 days, continue Dramamine, push fluids, will call if not improving soon or if she feels worse

## 2011-09-08 NOTE — Patient Instructions (Signed)
Travelers' Diarrhea Travelers' diarrhea (TD) is the most common illness affecting travelers. Each year many travelers develop diarrhea. TD usually occurs within the first week of travel. However, it may occur at any time while traveling. It may even occur after returning home. The most important risk factor is where you are going. High-risk places are the developing countries of:  [place].   [place].   [place].   [place].  High risk people include young adults and those with:  Transplants.   HIV infections.   Medicine that suppresses the immune system.   Inflammatory-bowel disease.   Diabetes.   H-2 blockers or antacids.  Attack rates are similar for men and women. The primary source of TD is eating or drinking food or water tainted with feces (stool or bowel movements). SYMPTOMS Most TD cases begin suddenly. Symptoms include stool that is increased in:  Frequency.   Volume.   Weight.  Altered stool consistency also is common. Typically, you have four to five loose or watery bowel movements each day. Other common symptoms are:  Nausea.  Vomiting.   Diarrhea.  Abdominal cramping.   Bloating.  Fever.   Urgency.  Malaise.   Most cases are not dangerous. Most cases go away in 1-2 days without treatment. TD is rarely life threatening. 90% of cases resolve within 1 week. 98% resolve within 1 month. CAUSES Infectious agents are the primary cause of TD. Germs cause almost 80% of TD cases. The most common germ produces:  Watery diarrhea with cramps.   Low-grade or no fever.  There are many other bacterial, viral and parasitic pathogens (disease causing "bugs").  PREVENTION  Avoid foods or beverages purchased from street vendors in high risk countries.   Avoid food from places where unclean conditions are present.   Avoid raw or undercooked meat and seafood.   Avoid raw fruits (e.g., oranges, bananas, avocados) and vegetables unless you peel them  yourself.   If handled properly, well-cooked and packaged foods usually are safe. Foods associated with increased risk for TD include:   Tap water.   Ice.   Unpasteurized milk.   Dairy products.   Safe beverages include:   Bottled carbonated beverages.   Hot tea or coffee.   Beer.   Wine.   Boiled water.   Water treated with iodine or chlorine.  ANTIBIOTICS ARE NOT RECOMMENDED AS PREVENTION  CDC (Centers for Disease Control) does not recommend antimicrobial drugs (medicine that kill germs) to prevent TD. Several studies show that Pepto-Bismol taken as either 2 tablets 4 times daily, or 2 fluid ounces 4 times daily, reduces the incidence of travelers' diarrhea. People that should avoid Pepto-Bismol include those:   Who are pregnant.   Allergic to aspirin.   Taking anticoagulants medicine (probenecid, methotrexate).   Be informed about potential side effects, in particular about temporary blackening of the tongue and stool, and rarely ringing in the ears. Because of potential adverse side effects, preventative Pepto-Bismol should not be used for more than 3 weeks.   Some antibiotics taken in a once-a-day dose are 90% effective at preventing travelers' diarrhea. However, antibiotics are not recommended as prevention. Routine antimicrobial prophylaxis increases your risk for:   Adverse reactions.   Infections with resistant organisms.   Antibiotics can increase your susceptibility to resistant bacterial pathogens and provide no protection against either viral or parasitic pathogens. This can give travelers a false sense of security. As a result, strict adherence to preventive measures is encouraged. Pepto-Bismol  should be used as an extra effort if prophylaxis is needed.  TREATMENT   TD usually is a self-limited disorder. It gets well without treatment. It often goes away without specific treatment. Oral re-hydration is often helpful to replace lost fluids and  electrolytes. Clear liquids are routinely recommended for adults. You may be helped with antimicrobial therapy if you develop three or more loose stools in an 8-hour period, especially if associated with:   Nausea.   Vomiting.   Abdominal cramps.   Fever.   Blood in stools.   Antibiotics usually are given for 3-5 days. Pepto-Bismol also may be used as treatment. Take one fluid ounce, or two 262 mg tablets every 30 minutes, for up to 8 doses in a 24-hour period. This can be repeated on a second day. If diarrhea persists despite therapy, you should be evaluated by a caregiver and treated for possible parasitic infection.   Because drug resistance is a continuing problem and may vary from country to country, professional assistance should be looked for if problems persist.   Antimotility agents (loperamide, diphenoxylate, and paregoric) mostly reduce diarrhea by slowing down the passage of food and drink in the gut. This allows more time for absorption. Some persons believe diarrhea is the body's defense mechanism to minimize contact time between gut pathogens and lining of the bowel. In several studies, antimotility agents have been useful in treating travelers' diarrhea by decreasing the duration of diarrhea. However, these agents should never be used by persons with fever or bloody diarrhea because they can increase the severity of disease by delaying clearance of causative organisms. Because antimotility agents are now available over the counter, their improper use is of concern. Complications have been reported from the use of these medicines such as:   Toxic megacolon.   Sepsis.   Disseminated intravascular coagulation.  FOR MORE INFORMATION Travelers should consult with a caregiver before departing on a trip abroad. Information about TD is available from:  Your local or state health departments.   World [employer] writer [facility]).  Other information that may be of interest to  travelers can be found at the [facility] Travelers' Health homepage at [email or web address]. SEEK IMMEDIATE MEDICAL CARE IF:  You are unable to keep fluids down.   Vomiting or diarrhea becomes persistent.   Abdominal (belly) pain develops or increases or localizes. (Right sided pain can be appendicitis and left sided pain in adults can be diverticulitis).   You develop an oral temperature above 102 F (38.9 C), or as your caregiver suggests.   Diarrhea becomes excessive or contains blood or mucous.   Excessive weakness, dizziness, fainting or extreme thirst.   Checking weight 2 to 3 times per day in babies and children will help verify adequate fluid replacement. Your caregiver will tell you what loss should concern you or suggest another visit to your personal physician.   Record your weight or your child's weight today. Compare this to your home scale and record all weights and time and date weighed. Try to check weight at the same times every day. Bring this chart to your caregivers if you or your child needs to be seen again.  Document Released: 11/20/2002 Document Re-Released: 05/20/2010 [facility] Patient Information 2011 [place], [place].

## 2011-09-08 NOTE — Progress Notes (Signed)
  Subjective:    Patient ID: Monique Bauer, female    DOB: 1936/10/21, 75 y.o.   MRN: [id number]  HPI  Anxiety--- started lexapro, xanax---> doing great  Also went to [place], came back 5 days ago and shortly after developed chills, sore throat, ear ache, hoarseness and diarrhea.  URI type of symptoms better, but diarrhea is still there: Watery stools, on and off, worse immediately after eating. Has tried Pepto-Bismol without help, Kaopectate to help a little but caused cramps. Also having nausea, but OTC Dramamine helps. Having able to keep fluids down but not solids.  Past Medical History  Diagnosis Date  . Screening for AAA (abdominal aortic aneurysm) 11/2009    aorta U/S, no AAA  . Hyperlipidemia   . Hypertension   . Osteoarthritis   . GERD (gastroesophageal reflux disease)   . Osteopenia   . Anxiety    No past surgical history on file.  Review of Systems Had low grade fever on and off, yesterday her temperature was 100. No disorder gross hematuria No GERD symptoms or upper abdominal burning. No blood in the stools. No recent by mouth antibiotics.    Objective:   Physical Exam  Constitutional: She is oriented to person, place, and time. She appears well-developed. No distress.  HENT:  Head: Normocephalic and atraumatic.  Right Ear: External ear normal.  Left Ear: External ear normal.       Oropharynx slightly dry  Cardiovascular: Normal rate, regular rhythm and normal heart sounds.   No murmur heard. Pulmonary/Chest: Effort normal and breath sounds normal. No respiratory distress. She has no wheezes. She has no rales.  Abdominal: She exhibits no distension. There is no tenderness. There is no rebound and no guarding.       Bowel sounds  slightly increased  Musculoskeletal: She exhibits no edema.  Neurological: She is alert and oriented to person, place, and time.  Skin: She is not diaphoretic.  Psychiatric: She has a normal mood and affect. Her behavior is  normal. Thought content normal.          Assessment & Plan:

## 2011-09-08 NOTE — Assessment & Plan Note (Signed)
Responding very well to Lexapro, using Xanax 3 or 4 times a week. No change

## 2011-09-15 ENCOUNTER — Ambulatory Visit: Payer: Medicare Other | Admitting: Internal Medicine

## 2011-09-17 ENCOUNTER — Ambulatory Visit: Payer: Medicare Other

## 2011-09-18 ENCOUNTER — Ambulatory Visit (INDEPENDENT_AMBULATORY_CARE_PROVIDER_SITE_OTHER): Payer: Medicare Other | Admitting: *Deleted

## 2011-09-18 DIAGNOSIS — Z23 Encounter for immunization: Secondary | ICD-10-CM

## 2011-09-25 ENCOUNTER — Other Ambulatory Visit: Payer: Self-pay | Admitting: Internal Medicine

## 2011-09-25 NOTE — Telephone Encounter (Signed)
Done

## 2011-10-16 ENCOUNTER — Ambulatory Visit (INDEPENDENT_AMBULATORY_CARE_PROVIDER_SITE_OTHER): Payer: Medicare Other | Admitting: Internal Medicine

## 2011-10-16 ENCOUNTER — Encounter: Payer: Self-pay | Admitting: Internal Medicine

## 2011-10-16 DIAGNOSIS — K219 Gastro-esophageal reflux disease without esophagitis: Secondary | ICD-10-CM

## 2011-10-16 DIAGNOSIS — E785 Hyperlipidemia, unspecified: Secondary | ICD-10-CM

## 2011-10-16 DIAGNOSIS — M899 Disorder of bone, unspecified: Secondary | ICD-10-CM

## 2011-10-16 DIAGNOSIS — F419 Anxiety disorder, unspecified: Secondary | ICD-10-CM

## 2011-10-16 DIAGNOSIS — F411 Generalized anxiety disorder: Secondary | ICD-10-CM

## 2011-10-16 DIAGNOSIS — Z0000 Encounter for general adult medical examination without abnormal findings: Secondary | ICD-10-CM

## 2011-10-16 DIAGNOSIS — G4700 Insomnia, unspecified: Secondary | ICD-10-CM

## 2011-10-16 DIAGNOSIS — M949 Disorder of cartilage, unspecified: Secondary | ICD-10-CM

## 2011-10-16 DIAGNOSIS — I10 Essential (primary) hypertension: Secondary | ICD-10-CM

## 2011-10-16 LAB — CBC WITH DIFFERENTIAL/PLATELET
Basophils Relative: 0.5 % (ref 0.0–3.0)
Eosinophils Absolute: 0.8 10*3/uL — ABNORMAL HIGH (ref 0.0–0.7)
Eosinophils Relative: 8.9 % — ABNORMAL HIGH (ref 0.0–5.0)
HCT: 42.3 % (ref 36.0–46.0)
Lymphs Abs: 2.6 10*3/uL (ref 0.7–4.0)
MCHC: 33.6 g/dL (ref 30.0–36.0)
MCV: 88.2 fl (ref 78.0–100.0)
Monocytes Absolute: 0.5 10*3/uL (ref 0.1–1.0)
Neutro Abs: 4.8 10*3/uL (ref 1.4–7.7)
Neutrophils Relative %: 54.9 % (ref 43.0–77.0)
RBC: 4.8 Mil/uL (ref 3.87–5.11)

## 2011-10-16 LAB — BASIC METABOLIC PANEL
Chloride: 103 mEq/L (ref 96–112)
Creatinine, Ser: 1 mg/dL (ref 0.4–1.2)
GFR: 57.1 mL/min — ABNORMAL LOW (ref >60.00)
Potassium: 3.7 mEq/L (ref 3.5–5.1)

## 2011-10-16 LAB — LIPID PANEL
Cholesterol: 161 mg/dL (ref 0–200)
LDL Cholesterol: 76 mg/dL (ref 0–99)
Triglycerides: 161 mg/dL — ABNORMAL HIGH (ref 0.0–149.0)

## 2011-10-16 LAB — ALT: ALT: 19 U/L (ref 0–35)

## 2011-10-16 LAB — AST: AST: 21 U/L (ref 0–37)

## 2011-10-16 MED ORDER — ALPRAZOLAM 0.25 MG PO TABS: 0.2500 mg | ORAL_TABLET | Freq: Three times a day (TID) | ORAL | Status: AC | PRN

## 2011-10-16 MED ORDER — ESCITALOPRAM OXALATE 10 MG PO TABS: 10.0000 mg | ORAL_TABLET | Freq: Every day | ORAL | Status: DC

## 2011-10-16 NOTE — Assessment & Plan Note (Signed)
Well controlled , RF  meds  

## 2011-10-16 NOTE — Assessment & Plan Note (Signed)
Well controlled 

## 2011-10-16 NOTE — Progress Notes (Signed)
  Subjective:    Patient ID: Monique Bauer, female    DOB: April 03, 1936, 75 y.o.   MRN: [id number]  HPI Here for Medicare AWV: 1. Risk factors based on Past M, S, F history: reviewed  2. Physical Activities: yard work, walks the treadmil 3. Depression/mood: on meds, sx well controlled   4. Hearing: no problems noted w/  normal converstaion, denies problems  5. ADL's: totally independent, drives w/o problems  6. Fall Risk: no recent falls, counseled provided  7. Home Safety: does feel safe at home 8. Height, weight, &visual acuity: see VS, has cataracts, stable x years  9. Counseling: see assessment and plan 10. Labs ordered based on risk factors: yes  11.       Referral Coordination, if needed 12.       Care Plan, see a/p  13.       Cognitive Assessment, motor skills, memory and cognition seemed intact  In addition, we discussed the following Hypertension--ambulatory blood pressure is within normal, good medication compliance GERD--good medication compliance, essentially asymptomatic. High cholesterol--good medication compliance, no side effects such as myalgias. Insomnia-- much better after treatment for anxiety.   Past Medical History  Diagnosis Date  . Screening for AAA (abdominal aortic aneurysm) 11/2009    aorta U/S, no AAA  . Hyperlipidemia   . Hypertension   . Osteoarthritis   . GERD (gastroesophageal reflux disease)   . Osteopenia   . Anxiety      Review of Systems  Respiratory: Negative for cough, shortness of breath and wheezing.   Cardiovascular: Negative for chest pain and leg swelling.  Gastrointestinal: Negative for abdominal pain and blood in stool.  Genitourinary: Negative for hematuria and difficulty urinating.  Psychiatric/Behavioral:       Sx well controlled, sleeps well w/ meds       Objective:   Physical Exam  Constitutional: She is oriented to person, place, and time. She appears well-developed and well-nourished. No distress.  HENT:  Head:  Normocephalic and atraumatic.  Neck: No thyromegaly present.       Normal carotid pulses  Cardiovascular: Normal rate, regular rhythm and normal heart sounds.   No murmur heard. Pulmonary/Chest: Effort normal and breath sounds normal. No respiratory distress. She has no wheezes. She has no rales.  Abdominal: Soft. Bowel sounds are normal. She exhibits no distension. There is no tenderness. There is no rebound.  Musculoskeletal: She exhibits no edema.  Neurological: She is alert and oriented to person, place, and time.  Skin: Skin is warm and dry. She is not diaphoretic.  Psychiatric: She has a normal mood and affect. Her behavior is normal. Judgment and thought content normal.      Assessment & Plan:

## 2011-10-16 NOTE — Assessment & Plan Note (Signed)
At goal.  

## 2011-10-16 NOTE — Assessment & Plan Note (Signed)
Reports good medication compliance Due for labs

## 2011-10-16 NOTE — Assessment & Plan Note (Addendum)
Td  10-2009 Pneumonia shot 2007 had shingles shot  had a flu shot already   female care PAP: per gyn, sees her every other  year [facility] -- done at  [place] , reports she is update   Cscope 2003 and 03-2006, next 2017  diet and exercise discussed

## 2011-10-16 NOTE — Assessment & Plan Note (Signed)
H/o osteopenia but DEXA 05-2010: normal Vit D 2009 normal Re asses DEXA 2013 rec ca and vit d

## 2011-10-16 NOTE — Assessment & Plan Note (Signed)
Sx well controlled  

## 2011-10-26 ENCOUNTER — Telehealth: Payer: Self-pay

## 2011-10-26 NOTE — Telephone Encounter (Signed)
Via letter advised patient: Cholesterol well controlled Blood sugar slt elevated, recheck from time to time. Good results! Continue same meds

## 2011-10-26 NOTE — Telephone Encounter (Signed)
Message copied by Francisco Capuchin on Mon Oct 26, 2011  1:43 PM ------      Message from: Willow Ora E      Created: Sun Oct 25, 2011 12:03 PM       Advise patient:      Cholesterol well controlled      Blood sugar slt elevated, recheck from time to time.      Good results! Continue same meds

## 2012-02-29 ENCOUNTER — Other Ambulatory Visit: Payer: Self-pay | Admitting: Internal Medicine

## 2012-02-29 NOTE — Telephone Encounter (Signed)
Refill done.  

## 2012-04-15 ENCOUNTER — Ambulatory Visit: Payer: Medicare Other | Admitting: Internal Medicine

## 2012-04-15 DIAGNOSIS — Z0289 Encounter for other administrative examinations: Secondary | ICD-10-CM

## 2012-06-02 ENCOUNTER — Telehealth: Payer: Self-pay | Admitting: *Deleted

## 2012-06-02 DIAGNOSIS — M8580 Other specified disorders of bone density and structure, unspecified site: Secondary | ICD-10-CM

## 2012-06-07 NOTE — Telephone Encounter (Signed)
Done

## 2012-06-09 ENCOUNTER — Encounter: Payer: Self-pay | Admitting: Internal Medicine

## 2012-06-14 MED ORDER — PANTOPRAZOLE SODIUM 40 MG PO TBEC: 40.0000 mg | DELAYED_RELEASE_TABLET | Freq: Every day | ORAL | Status: DC

## 2012-06-14 NOTE — Telephone Encounter (Signed)
Re-sent rx for pantoprazole 40mg .

## 2012-06-15 ENCOUNTER — Telehealth: Payer: Self-pay | Admitting: Internal Medicine

## 2012-06-15 NOTE — Telephone Encounter (Signed)
Left msg for pt to return call.

## 2012-06-15 NOTE — Telephone Encounter (Signed)
Advise patient: DEXA done 05-15-12 showed osteopenia which is stable. Good results, recheck DEXA in ~ 2 years Continue OTC Calcium, vit D, stay active

## 2012-06-17 NOTE — Telephone Encounter (Signed)
Discussed with pt

## 2012-06-20 ENCOUNTER — Other Ambulatory Visit: Payer: Self-pay | Admitting: Internal Medicine

## 2012-06-20 NOTE — Telephone Encounter (Signed)
Refill done.  

## 2012-06-23 ENCOUNTER — Encounter: Payer: Self-pay | Admitting: Internal Medicine

## 2012-09-14 ENCOUNTER — Ambulatory Visit (INDEPENDENT_AMBULATORY_CARE_PROVIDER_SITE_OTHER): Payer: Medicare Other

## 2012-09-14 DIAGNOSIS — Z23 Encounter for immunization: Secondary | ICD-10-CM

## 2012-12-13 ENCOUNTER — Encounter: Payer: Self-pay | Admitting: Internal Medicine

## 2012-12-13 ENCOUNTER — Ambulatory Visit (INDEPENDENT_AMBULATORY_CARE_PROVIDER_SITE_OTHER): Payer: Medicare Other | Admitting: Internal Medicine

## 2012-12-13 VITALS — BP 132/80 | HR 70 | Temp 98.0°F | Ht 60.0 in | Wt 150.0 lb

## 2012-12-13 DIAGNOSIS — M949 Disorder of cartilage, unspecified: Secondary | ICD-10-CM

## 2012-12-13 DIAGNOSIS — I10 Essential (primary) hypertension: Secondary | ICD-10-CM

## 2012-12-13 DIAGNOSIS — E785 Hyperlipidemia, unspecified: Secondary | ICD-10-CM

## 2012-12-13 DIAGNOSIS — F419 Anxiety disorder, unspecified: Secondary | ICD-10-CM

## 2012-12-13 DIAGNOSIS — M899 Disorder of bone, unspecified: Secondary | ICD-10-CM

## 2012-12-13 DIAGNOSIS — F411 Generalized anxiety disorder: Secondary | ICD-10-CM

## 2012-12-13 DIAGNOSIS — K219 Gastro-esophageal reflux disease without esophagitis: Secondary | ICD-10-CM

## 2012-12-13 DIAGNOSIS — J309 Allergic rhinitis, unspecified: Secondary | ICD-10-CM

## 2012-12-13 DIAGNOSIS — Z0000 Encounter for general adult medical examination without abnormal findings: Secondary | ICD-10-CM

## 2012-12-13 NOTE — Progress Notes (Signed)
Subjective:    Patient ID: Monique Bauer, female    DOB: 23-Jan-1936, 76 y.o.   MRN: [id number]  HPI Here for Medicare AWV:  1. Risk factors based on Past M, S, F history: reviewed  2. Physical Activities: yard work, walks the treadmill; lately exercise is slt limited d/t having some shoulder pain 3. Depression/mood: on meds, sx well controlled  4. Hearing: no problems noted w/ normal converstaion, denies problems  5. ADL's: totally independent, drives w/o problems  6. Fall Risk: had a mechanical  Fall while walking the dog, no serious injuries, counseled provided  7. Home Safety: does feel safe at home  8. Height, weight, &visual acuity: see VS, has cataracts, stable x years  9. Counseling: see assessment and plan  10. Labs ordered based on risk factors: yes  11. Referral Coordination, if needed  12. Care Plan, see a/p  13. Cognitive Assessment, motor skills, memory and cognition seemed intact   In addition, we discussed the following Osteopenia, on calcium and vitamin D Hypertension, good medication compliance, ambulatory BPs usually better than the reading we got today (132/80). GERD, she switch to a generic Protonix, occasionally breakthrough symptoms High cholesterol, good medication compliance.  Past Medical History  Diagnosis Date  . Screening for AAA (abdominal aortic aneurysm) 11/2009    aorta U/S, no AAA  . Hyperlipidemia   . Hypertension   . Osteoarthritis   . GERD (gastroesophageal reflux disease)   . Osteopenia   . Anxiety    Past Surgical History  Procedure Date  . No past surgeries    History   Social History  . Marital Status: Married    Spouse Name: N/A    Number of Children: 3  . Years of Education: N/A   Occupational History  . retired     Social History Main Topics  . Smoking status: Former Smoker    Quit date: 10/15/1981  . Smokeless tobacco: Never Used     Comment: was a social smoker  . Alcohol Use: Yes     Comment: wine sometimes    . Drug Use: No  . Sexually Active: Not on file   Other Topics Concern  . Not on file   Social History Narrative    3 daughters live in [place], 5 G-children, 1 GG sonmarried to [place]   Family History  Problem Relation Age of Onset  . Colon cancer Neg Hx   . Breast cancer      aunt   . Coronary artery disease      M at age 76  . Diabetes      several family members (M F S daughter)    Review of Systems No chest pain or shortness of breath No nausea, vomiting, diarrhea Allergies has been particularly severe this year, taking Allegra daily for the last few weeks, symptoms are better. Denies any dysphagia, odynophagia or weight loss.    Objective:   Physical Exam General -- alert, well-developed, and well-nourished.   Neck --no thyromegaly , normal carotid pulse Lungs -- normal respiratory effort, no intercostal retractions, no accessory muscle use, and normal breath sounds.   Heart-- normal rate, regular rhythm, no murmur, and no gallop.   Abdomen--soft, non-tender, no distention, no masses, no HSM, no guarding, and no rigidity.  No bruit  Extremities-- no pretibial edema bilaterally Psych-- Cognition and judgment appear intact. Alert and cooperative with normal attention span and concentration.  not anxious appearing and not depressed appearing.  Assessment & Plan:

## 2012-12-13 NOTE — Patient Instructions (Addendum)
Please come back fasting: BMP, CBC--- dx  hypertension FLP, AST, ALT--- dx  high cholesterol Next visit in one year as long as you feel well and your blood pressure remains within normal.  Fall Prevention and Home Safety Falls cause injuries and can affect all age groups. It is possible to use preventive measures to significantly decrease the likelihood of falls. There are many simple measures which can make your home safer and prevent falls. OUTDOORS  Repair cracks and edges of walkways and driveways.  Remove high doorway thresholds.  Trim shrubbery on the main path into your home.  Have good outside lighting.  Clear walkways of tools, rocks, debris, and clutter.  Check that handrails are not broken and are securely fastened. Both sides of steps should have handrails.  Have leaves, snow, and ice cleared regularly.  Use sand or salt on walkways during winter months.  In the garage, clean up grease or oil spills. BATHROOM  Install night lights.  Install grab bars by the toilet and in the tub and shower.  Use non-skid mats or decals in the tub or shower.  Place a plastic non-slip stool in the shower to sit on, if needed.  Keep floors dry and clean up all water on the floor immediately.  Remove soap buildup in the tub or shower on a regular basis.  Secure bath mats with non-slip, double-sided rug tape.  Remove throw rugs and tripping hazards from the floors. BEDROOMS  Install night lights.  Make sure a bedside light is easy to reach.  Do not use oversized bedding.  Keep a telephone by your bedside.  Have a firm chair with side arms to use for getting dressed.  Remove throw rugs and tripping hazards from the floor. KITCHEN  Keep handles on pots and pans turned toward the center of the stove. Use back burners when possible.  Clean up spills quickly and allow time for drying.  Avoid walking on wet floors.  Avoid hot utensils and knives.  Position shelves so  they are not too high or low.  Place commonly used objects within easy reach.  If necessary, use a sturdy step stool with a grab bar when reaching.  Keep electrical cables out of the way.  Do not use floor polish or wax that makes floors slippery. If you must use wax, use non-skid floor wax.  Remove throw rugs and tripping hazards from the floor. STAIRWAYS  Never leave objects on stairs.  Place handrails on both sides of stairways and use them. Fix any loose handrails. Make sure handrails on both sides of the stairways are as long as the stairs.  Check carpeting to make sure it is firmly attached along stairs. Make repairs to worn or loose carpet promptly.  Avoid placing throw rugs at the top or bottom of stairways, or properly secure the rug with carpet tape to prevent slippage. Get rid of throw rugs, if possible.  Have an electrician put in a light switch at the top and bottom of the stairs. OTHER FALL PREVENTION TIPS  Wear low-heel or rubber-soled shoes that are supportive and fit well. Wear closed toe shoes.  When using a stepladder, make sure it is fully opened and both spreaders are firmly locked. Do not climb a closed stepladder.  Add color or contrast paint or tape to grab bars and handrails in your home. Place contrasting color strips on first and last steps.  Learn and use mobility aids as needed. Install an Lobbyist emergency  response system.  Turn on lights to avoid dark areas. Replace light bulbs that burn out immediately. Get light switches that glow.  Arrange furniture to create clear pathways. Keep furniture in the same place.  Firmly attach carpet with non-skid or double-sided tape.  Eliminate uneven floor surfaces.  Select a carpet pattern that does not visually hide the edge of steps.  Be aware of all pets. OTHER HOME SAFETY TIPS  Set the water temperature for 120 F (48.8 C).  Keep emergency numbers on or near the telephone.  Keep smoke  detectors on every level of the home and near sleeping areas. Document Released: 11/20/2002 Document Revised: 05/31/2012 Document Reviewed: 02/19/2012 [facility] Patient Information 2013 [place], [place].

## 2012-12-13 NOTE — Assessment & Plan Note (Signed)
Well-controlled most of the time, since she switch to generic Protonix has occasional breakthrough symptoms. Recommend to continue with Protonix for now and watch diet.

## 2012-12-13 NOTE — Assessment & Plan Note (Signed)
Symptoms well controlled with OTC Allegra, recommend to avoid the "D" formulations. We discussed a nasal spray, pt not interested, history of nosebleeds.

## 2012-12-13 NOTE — Assessment & Plan Note (Addendum)
Continue with calcium, vitamin D at stay active. Will check a bone density test to be done at time of MMG (July) Correction: Last bone density test 05-2012, no need for another bone density test at this time

## 2012-12-13 NOTE — Assessment & Plan Note (Signed)
Good compliance with medication, check labs 

## 2012-12-13 NOTE — Assessment & Plan Note (Signed)
Well-controlled, refill medicines

## 2012-12-13 NOTE — Assessment & Plan Note (Signed)
Td  10-2009 Pneumonia shot 2007 had shingles shot  had a flu shot already   female care PAP: per gyn, sees her every other  Year , next visit 2014  MMG -- done at  [place] last 06-2012 (-)  Cscope 2003 and 03-2006, next 2017  diet and exercise discussed

## 2012-12-13 NOTE — Assessment & Plan Note (Signed)
Well-controlled,  EKG today showed normal sinus rhythm. Plan: BMP, continue with same medications

## 2012-12-14 DIAGNOSIS — E119 Type 2 diabetes mellitus without complications: Secondary | ICD-10-CM

## 2012-12-14 HISTORY — DX: Type 2 diabetes mellitus without complications: E11.9

## 2012-12-15 ENCOUNTER — Other Ambulatory Visit (INDEPENDENT_AMBULATORY_CARE_PROVIDER_SITE_OTHER): Payer: Medicare Other

## 2012-12-15 DIAGNOSIS — I10 Essential (primary) hypertension: Secondary | ICD-10-CM

## 2012-12-15 DIAGNOSIS — E7800 Pure hypercholesterolemia, unspecified: Secondary | ICD-10-CM

## 2012-12-15 LAB — LIPID PANEL
HDL: 51.9 mg/dL (ref >39.00)
LDL Cholesterol: 81 mg/dL (ref 0–99)
Total CHOL/HDL Ratio: 3
Triglycerides: 157 mg/dL — ABNORMAL HIGH (ref 0.0–149.0)
VLDL: 31.4 mg/dL (ref 0.0–40.0)

## 2012-12-15 LAB — CBC WITH DIFFERENTIAL/PLATELET
Basophils Relative: 0.7 % (ref 0.0–3.0)
Eosinophils Relative: 7.4 % — ABNORMAL HIGH (ref 0.0–5.0)
Hemoglobin: 14 g/dL (ref 12.0–15.0)
Lymphocytes Relative: 25.9 % (ref 12.0–46.0)
MCHC: 33.9 g/dL (ref 30.0–36.0)
Monocytes Relative: 7.8 % (ref 3.0–12.0)
Neutro Abs: 4.8 10*3/uL (ref 1.4–7.7)
Neutrophils Relative %: 58.2 % (ref 43.0–77.0)
RBC: 4.74 Mil/uL (ref 3.87–5.11)
WBC: 8.3 10*3/uL (ref 4.5–10.5)

## 2012-12-15 LAB — AST: AST: 21 U/L (ref 0–37)

## 2012-12-15 LAB — BASIC METABOLIC PANEL
CO2: 26 mEq/L (ref 19–32)
Calcium: 9 mg/dL (ref 8.4–10.5)
Creatinine, Ser: 0.9 mg/dL (ref 0.4–1.2)
Sodium: 139 mEq/L (ref 135–145)

## 2012-12-21 ENCOUNTER — Telehealth: Payer: Self-pay | Admitting: Internal Medicine

## 2012-12-21 ENCOUNTER — Ambulatory Visit: Payer: Medicare Other

## 2012-12-21 DIAGNOSIS — R7309 Other abnormal glucose: Secondary | ICD-10-CM

## 2012-12-21 LAB — HEMOGLOBIN A1C: Hgb A1c MFr Bld: 7.1 % — ABNORMAL HIGH (ref 4.6–6.5)

## 2012-12-21 MED ORDER — SIMVASTATIN 80 MG PO TABS: 80.0000 mg | ORAL_TABLET | Freq: Every day | ORAL | Status: DC

## 2012-12-21 MED ORDER — LISINOPRIL-HYDROCHLOROTHIAZIDE 10-12.5 MG PO TABS: 1.0000 | ORAL_TABLET | Freq: Every day | ORAL | Status: DC

## 2012-12-21 NOTE — Telephone Encounter (Signed)
Refill done.  

## 2012-12-21 NOTE — Telephone Encounter (Signed)
Refill: Simvastatin tab 80 mg. Take 1 tablet daily. Qty 90. Last fill 1.8.14 Refill: Lisinopril-hctz tab 10-12.5. Take 1 tablet daily. Qty 90. Last fill 09-06-12

## 2012-12-21 NOTE — Telephone Encounter (Signed)
Patients husband came into office to request 10 day supply of escitalopram and lisinopril-hctz be sent to [facility]. Pt also needs 90 day supply of escitalopram sent to [facility].

## 2012-12-21 NOTE — Telephone Encounter (Signed)
OK to fill? Please advise.

## 2012-12-21 NOTE — Telephone Encounter (Signed)
Please do

## 2012-12-21 NOTE — Addendum Note (Signed)
Addended by: Willow Ora E on: 12/21/2012 09:16 AM   Modules accepted: Orders

## 2012-12-22 MED ORDER — ESCITALOPRAM OXALATE 10 MG PO TABS: 10.0000 mg | ORAL_TABLET | Freq: Every day | ORAL | Status: DC

## 2012-12-22 MED ORDER — LISINOPRIL-HYDROCHLOROTHIAZIDE 10-12.5 MG PO TABS: 1.0000 | ORAL_TABLET | Freq: Every day | ORAL | Status: DC

## 2012-12-22 NOTE — Telephone Encounter (Signed)
Refills done.

## 2012-12-27 ENCOUNTER — Encounter: Payer: Self-pay | Admitting: *Deleted

## 2012-12-29 ENCOUNTER — Telehealth: Payer: Self-pay | Admitting: Internal Medicine

## 2012-12-29 DIAGNOSIS — R7303 Prediabetes: Secondary | ICD-10-CM

## 2012-12-29 NOTE — Telephone Encounter (Signed)
Patient received letter went 12/27/12. She has follow up appt scheduled for 3-4 months and wants to go ahead with the nutrition referral.

## 2012-12-30 NOTE — Telephone Encounter (Signed)
Ok to enter referral 

## 2012-12-30 NOTE — Telephone Encounter (Signed)
Referral entered  

## 2012-12-30 NOTE — Telephone Encounter (Signed)
Please do

## 2013-01-24 ENCOUNTER — Encounter: Payer: Medicare Other | Attending: Internal Medicine | Admitting: Dietician

## 2013-01-24 ENCOUNTER — Encounter: Payer: Self-pay | Admitting: Dietician

## 2013-01-24 VITALS — Ht 60.0 in | Wt 155.5 lb

## 2013-01-24 DIAGNOSIS — Z713 Dietary counseling and surveillance: Secondary | ICD-10-CM | POA: Insufficient documentation

## 2013-01-24 DIAGNOSIS — R7309 Other abnormal glucose: Secondary | ICD-10-CM | POA: Insufficient documentation

## 2013-01-24 NOTE — Patient Instructions (Addendum)
   Exercise:  Aim for 150 + minutes of exercise per week.  Aerobic is best at lowering blood glucose.  With arthritis, water will decrease the stress on joints.  Consider walking in the water and spending sometime doing some of the exercises that you learned in your previous water aerobics class.  Aim for a 5-10% weight loss over the next 3-6 months.  This will be about 8-16 lbs.  This will greatly help with lowering blood glucose.  Aim to lower the A1C closer to 6.0% (Exercise,weight loss, and eating less carb will help)  Use a sugar substitute such as Splenda rather than sugar.  Use in moderation.  Keep up the water it is the best for hydration.  Aim to increase the fiber in your diet.  Continue with the whole grains and add more of the non-starchy vegetables.  On the Food Label:  Check serving size   Note the number of total Carbohydrates  Look for 2 gm of fiber per slice of bread and with cereals and vegetables/fruits, aim for 3+ gm fiber per serving.  Check the sugar content:  For the majority of the time keep the sugar level on the label at (0-9 gm) per serving   Aim to have a protein source at all meals and snacks.  Increase Non-starchy vegetable intake.  These are free.  Limit fats to 1-2 servings per meal.  Use the food label for serving size  Aim for 30-45 gm of carbohydrates per meal and 15 gm of carb for snacks.  Continue to limit fried and fatty foods.

## 2013-01-24 NOTE — Progress Notes (Signed)
Medical Nutrition Therapy:  Appt start time: 1030 end time:  1130.  Assessment:  Primary concerns today: Wants help with healthy eating, carbs, sugar alcohol, and how many carbs per day, how much sugar.  Recently diagnosed with pre-diabetes/abnormal blood glucose.  A1C on 12/21/12 was at 7.12 %.  She has a positive family history and is anxious to get the glucose level down and to lose some weight.  Complains that she has been gaining weight and the only explanation she has is a decrease in exercise.  Has arthritis and her knees limit her level of walking and physical activity.  BLOOD GLUCOSE MONITORING: Currently not monitoring.  HYPERGLYCEMIA: Notes that she has had some increased thirst.  HYPOGLYCEMIA: Notes no episodes other than at times being really hungry and needing to eat.  DILATED EYE EXAMINATION:  Has not had the dilated exam in 2 years.  Encouraged her to have one.  FOOT SELF-EXAMINATION: Doing daily "looking at feet".  Review of the daily foot examination and foot care.  MEDICATIONS: Completed med review.  Currently no DM meds.   DIETARY INTAKE:  Usual eating pattern includes 3 meals and 3 snacks per day.  Everyday foods include meats, fruits, vegetables, starches and dairy.  Avoided foods include Sugar substitutes which give her a headache.    24-hr recall:  B ( AM): 7:30 Bowl of grits ( home made about 1 cup) and 1 slice of wheat toast, cup of coffee with 1 tsp of sugar.OR bagel with lite cream cheese,OR steel cut oatmeal (about 1 cup) with 1 slice of toast. Snk ( AM): 10:00 tangerine or orange  L ( PM): 12:00 PM sandwich of bowl of tomato soup with a few crackers and unsweetened tea, sandwich (ww bread), [place] with lettuce and mustard and maybe baked chips.  Snk ( PM): yogurt (80 calorie) D ( PM): 5:30-6:00  Pinto beans 1/2 and cornbread (about 15 gm) with applesauce 1/2 cupsweetened with Splenda.water with crystal light. Snk ( PM): Sometimes apple sauce Beverages:  Water, unsweetened tea, coffee with sugar  Usual physical activity: [place] 3 times per week doing machines and walking (track or a treadmill) .        Estimated energy needs:  HT: 60 in  WT: 155.5 lb  BMI: 30.4 kg/m2  Adj WT:118 lb  (54 kg) 1300-1400 calories 150-155 g carbohydrates 100-105 g protein 36-38 g fat  Progress Towards Goal(s):  In progress.   Nutritional Diagnosis:  [place]-2.1 Inpaired nutrition utilization As related to blood glucose.  As evidenced by diagnosis of abnormal blood glucose with an A1C at 7.1% and a history of steady weight gain  .    Intervention:   Handouts given during visit include:  Living Well with Diabetes  Controlling Blood Glucose  Yellow Card with Diet Prescription  Carb Counting Guide by [employer]  RECOMMENDATIONS:  Exercise:  Aim for 150 + minutes of exercise per week.  Aerobic is best at lowering blood glucose.  With arthritis, water will decrease the stress on joints.  Consider walking in the water and spending sometime doing some of the exercises that you learned in your previous water aerobics class.  Aim for a 5-10% weight loss over the next 3-6 months.  This will be about 8-16 lbs.  This will greatly help with lowering blood glucose.  Aim to lower the A1C closer to 6.0% (Exercise,weight loss, and eating less carb will help)  Use a sugar substitute such as Splenda rather than sugar.  Use  in moderation.  Keep up the water it is the best for hydration.  Aim to increase the fiber in your diet.  Continue with the whole grains and add more of the non-starchy vegetables.  On the Food Label:  Check serving size   Note the number of total Carbohydrates  Look for 2 gm of fiber per slice of bread and with cereals and vegetables/fruits, aim for 3+ gm fiber per serving.  Check the sugar content:  For the majority of the time keep the sugar level on the label at (0-9 gm) per serving   Aim to have a protein source at all meals and  snacks.  Increase Non-starchy vegetable intake.  These are free.  Limit fats to 1-2 servings per meal.  Use the food label for serving size  Aim for 30-45 gm of carbohydrates per meal and 15 gm of carb for snacks.  Continue to limit fried and fatty foods.   Monitoring/Evaluation:  Dietary intake, exercise, and body weight in 3 months if insurance allows and call or e-mail with questions.Marland Kitchen

## 2013-02-03 ENCOUNTER — Telehealth: Payer: Self-pay | Admitting: *Deleted

## 2013-02-03 ENCOUNTER — Encounter: Payer: Self-pay | Admitting: Internal Medicine

## 2013-02-03 NOTE — Telephone Encounter (Signed)
Message copied by Nada Maclachlan on Fri Feb 03, 2013  6:03 PM ------      Message from: Willow Ora E      Created: Fri Feb 03, 2013  5:41 PM       eneter a order for a ortho referral, dx knee pain ------

## 2013-02-03 NOTE — Telephone Encounter (Signed)
Referral entered  

## 2013-04-11 ENCOUNTER — Ambulatory Visit (INDEPENDENT_AMBULATORY_CARE_PROVIDER_SITE_OTHER): Payer: Medicare Other | Admitting: Internal Medicine

## 2013-04-11 VITALS — BP 118/78 | HR 62 | Temp 98.2°F | Wt 147.0 lb

## 2013-04-11 DIAGNOSIS — E119 Type 2 diabetes mellitus without complications: Secondary | ICD-10-CM

## 2013-04-11 LAB — HEMOGLOBIN A1C: Hgb A1c MFr Bld: 6.7 % — ABNORMAL HIGH (ref 4.6–6.5)

## 2013-04-11 NOTE — Progress Notes (Signed)
  Subjective:    Patient ID: Monique Bauer, female    DOB: 1936-05-28, 77 y.o.   MRN: [id number]  HPI Followup visit 3 months ago was diagnosed with diabetes with A1c of 7.1. Since then, has seen the nutritionist, has changed her diet. Still having problems with her knee consequently has not been exercising as much [place] she would like to.  Past Medical History  Diagnosis Date  . Screening for AAA (abdominal aortic aneurysm) 11/2009    aorta U/S, no AAA  . Hyperlipidemia   . Hypertension   . Osteoarthritis   . GERD (gastroesophageal reflux disease)   . Osteopenia   . Anxiety   . Diabetes 12-2012   Past Surgical History  Procedure Laterality Date  . No past surgeries      Review of Systems We review her medications, good compliance. Ambulatory BPs in the 120/80 area. Before the dx of DM she felt fatigued, that has resolved.     Objective:   Physical Exam General -- alert, well-developed, No apparent distress Lungs -- normal respiratory effort, no intercostal retractions, no accessory muscle use, and normal breath sounds.   Heart-- normal rate, regular rhythm, no murmur, and no gallop.  Neurologic-- alert & oriented X3 and strength normal in all extremities. Psych-- Cognition and judgment appear intact. Alert and cooperative with normal attention span and concentration.  not anxious appearing and not depressed appearing.      Assessment & Plan:

## 2013-04-11 NOTE — Patient Instructions (Addendum)
Check your sugars daily at different times. Also check if you feel your sugar is low or too high Call with readings in one month Next visit in 3-4 months

## 2013-04-11 NOTE — Assessment & Plan Note (Addendum)
Recently diagnosed with diabetes, status post a nutritionist visit which she enjoyed. We discussed the meaning of that A1c, her CBG goals, recommend to check her blood sugar daily plus if she has the feeling of low or high sugar.Glucometer provided. Encouraged to continue with her better diet and remain as active as possible. She already has an appointment to see an eye doctor. Followup 3-4 months  depending on results. If A1c is > 6.5, I will recommend metformin. Potential side effects discussed.

## 2013-04-12 ENCOUNTER — Encounter: Payer: Self-pay | Admitting: Internal Medicine

## 2013-04-13 ENCOUNTER — Telehealth: Payer: Self-pay | Admitting: *Deleted

## 2013-04-13 ENCOUNTER — Encounter: Payer: Self-pay | Admitting: Internal Medicine

## 2013-04-13 LAB — HM DIABETES EYE EXAM

## 2013-04-13 MED ORDER — METFORMIN HCL 500 MG PO TABS: 500.0000 mg | ORAL_TABLET | Freq: Two times a day (BID) | ORAL | Status: DC

## 2013-04-13 NOTE — Telephone Encounter (Signed)
Wanda Plump, MD - Please call metformin 500 mg one by mouth twice a day #60 and 6 refills Refill done.

## 2013-04-14 ENCOUNTER — Telehealth: Payer: Self-pay | Admitting: *Deleted

## 2013-04-14 MED ORDER — ONETOUCH LANCETS MISC: Status: DC

## 2013-04-14 MED ORDER — GLUCOSE BLOOD VI STRP: ORAL_STRIP | Status: DC

## 2013-04-14 NOTE — Telephone Encounter (Signed)
Refill done.  

## 2013-04-16 ENCOUNTER — Encounter: Payer: Self-pay | Admitting: Internal Medicine

## 2013-04-17 ENCOUNTER — Telehealth: Payer: Self-pay | Admitting: *Deleted

## 2013-04-17 MED ORDER — GLUCOSE BLOOD VI STRP: ORAL_STRIP | Status: DC

## 2013-04-17 MED ORDER — ONETOUCH LANCETS MISC: Status: DC

## 2013-04-17 NOTE — Telephone Encounter (Signed)
Refill done.  

## 2013-04-19 ENCOUNTER — Encounter: Payer: Self-pay | Admitting: *Deleted

## 2013-05-10 ENCOUNTER — Telehealth: Payer: Self-pay | Admitting: Internal Medicine

## 2013-05-10 ENCOUNTER — Telehealth: Payer: Self-pay | Admitting: General Practice

## 2013-05-10 MED ORDER — ONETOUCH LANCETS MISC: Status: DC

## 2013-05-10 MED ORDER — GLUCOSE BLOOD VI STRP: ORAL_STRIP | Status: DC

## 2013-05-10 NOTE — Telephone Encounter (Signed)
Test strips filled per pt request.

## 2013-05-10 NOTE — Telephone Encounter (Signed)
Refill done.  

## 2013-05-10 NOTE — Telephone Encounter (Signed)
Pt came in to get refill sent to [facility]  For One Touch Verio IQ blood glucose monitoring Lancets #30 pls call patient to confirm it being sent

## 2013-05-30 ENCOUNTER — Encounter: Payer: Self-pay | Admitting: Internal Medicine

## 2013-06-08 ENCOUNTER — Telehealth: Payer: Self-pay | Admitting: *Deleted

## 2013-06-08 MED ORDER — ESCITALOPRAM OXALATE 10 MG PO TABS: 10.0000 mg | ORAL_TABLET | Freq: Every day | ORAL | Status: DC

## 2013-06-08 MED ORDER — PANTOPRAZOLE SODIUM 40 MG PO TBEC: 40.0000 mg | DELAYED_RELEASE_TABLET | Freq: Every day | ORAL | Status: DC

## 2013-06-08 NOTE — Telephone Encounter (Signed)
Refill done.  

## 2013-07-07 ENCOUNTER — Encounter: Payer: Self-pay | Admitting: Internal Medicine

## 2013-09-05 ENCOUNTER — Telehealth: Payer: Self-pay | Admitting: Internal Medicine

## 2013-09-05 MED ORDER — PANTOPRAZOLE SODIUM 40 MG PO TBEC: 40.0000 mg | DELAYED_RELEASE_TABLET | Freq: Every day | ORAL | Status: DC

## 2013-09-05 MED ORDER — ESCITALOPRAM OXALATE 10 MG PO TABS: 10.0000 mg | ORAL_TABLET | Freq: Every day | ORAL | Status: DC

## 2013-09-05 MED ORDER — METFORMIN HCL 500 MG PO TABS: 500.0000 mg | ORAL_TABLET | Freq: Two times a day (BID) | ORAL | Status: DC

## 2013-09-05 NOTE — Telephone Encounter (Signed)
Refill requests for:   pantoprazole (PROTONIX) 40 MG tablet  Directions: Take 1 tablet daily. Qty: 90 Last filled: 06/08/2013 Last OV: 04/11/2013  metFORMIN (GLUCOPHAGE) 500 MG  Directions: Take 1 tablet by mouth twice a day with meals. Qty: 180 Last filled: 04/13/2013 Last OV: 04/11/2013  escitalopram (LEXAPRO) 10 MG tablet  Directions: Take 1 tablet by mouth daily. Qty: 90 Last filled: 06/08/2013 Last OV: 04/11/2013

## 2013-09-05 NOTE — Telephone Encounter (Signed)
rx refilled per protocol. DJR  

## 2013-09-06 ENCOUNTER — Ambulatory Visit (INDEPENDENT_AMBULATORY_CARE_PROVIDER_SITE_OTHER): Payer: Medicare Other

## 2013-09-06 DIAGNOSIS — Z23 Encounter for immunization: Secondary | ICD-10-CM

## 2013-09-13 ENCOUNTER — Ambulatory Visit: Payer: Self-pay | Admitting: Internal Medicine

## 2013-09-19 ENCOUNTER — Encounter: Payer: Self-pay | Admitting: Internal Medicine

## 2013-09-19 ENCOUNTER — Ambulatory Visit (INDEPENDENT_AMBULATORY_CARE_PROVIDER_SITE_OTHER): Payer: Medicare Other | Admitting: Internal Medicine

## 2013-09-19 VITALS — BP 155/76 | HR 75 | Temp 97.8°F | Wt 147.4 lb

## 2013-09-19 DIAGNOSIS — M1990 Unspecified osteoarthritis, unspecified site: Secondary | ICD-10-CM

## 2013-09-19 DIAGNOSIS — I10 Essential (primary) hypertension: Secondary | ICD-10-CM

## 2013-09-19 DIAGNOSIS — E119 Type 2 diabetes mellitus without complications: Secondary | ICD-10-CM

## 2013-09-19 DIAGNOSIS — E785 Hyperlipidemia, unspecified: Secondary | ICD-10-CM

## 2013-09-19 LAB — BASIC METABOLIC PANEL
BUN: 17 mg/dL (ref 6–23)
CO2: 30 mEq/L (ref 19–32)
Glucose, Bld: 113 mg/dL — ABNORMAL HIGH (ref 70–99)
Potassium: 4 mEq/L (ref 3.5–5.1)
Sodium: 140 mEq/L (ref 135–145)

## 2013-09-19 NOTE — Assessment & Plan Note (Signed)
Started metformin, see history of present illness. Was seen by her eye doctor, reportedly no retinopathy. Doing well, cont same meds, check a A1c

## 2013-09-19 NOTE — Progress Notes (Signed)
  Subjective:    Patient ID: Monique Bauer, female    DOB: 1936/10/04, 77 y.o.   MRN: [id number]  HPI Routine office visit Diabetes-she started metformin, had some dyspepsia the first week but now she is asymptomatic. Blood sugars when fasting around 117, after meals up to 180, average 129. Hypertension--good medication compliance, no recent ambulatory BPs. DJD--has not required meloxicam lately  Past Medical History  Diagnosis Date  . Screening for AAA (abdominal aortic aneurysm) 11/2009    aorta U/S, no AAA  . Hyperlipidemia   . Hypertension   . Osteoarthritis   . GERD (gastroesophageal reflux disease)   . Osteopenia   . Anxiety   . Diabetes 12-2012   Past Medical History  Diagnosis Date  . Screening for AAA (abdominal aortic aneurysm) 11/2009    aorta U/S, no AAA  . Hyperlipidemia   . Hypertension   . Osteoarthritis   . GERD (gastroesophageal reflux disease)   . Osteopenia   . Anxiety   . Diabetes 12-2012   History   Social History  . Marital Status: Married    Spouse Name: N/A    Number of Children: 3  . Years of Education: N/A   Occupational History  . retired     Social History Main Topics  . Smoking status: Former Smoker    Quit date: 10/15/1981  . Smokeless tobacco: Never Used     Comment: was a social smoker  . Alcohol Use: Yes     Comment: wine sometimes  . Drug Use: No  . Sexual Activity: Not on file   Other Topics Concern  . Not on file   Social History Narrative    3 daughters live in [place], 5 G-children, 1 GG son, married to [place]     Review of Systems Diet-- healthy most of the time Exercise-- gym 3-4/week No  CP, SOB, lower extremity edema No dysuria, gross hematuria, difficulty urinating   No anxiety, depression No numbness in the feet          Objective:   Physical Exam BP 155/76  Pulse 75  Temp(Src) 97.8 F (36.6 C)  Wt 147 lb 6.4 oz (66.86 kg)  BMI 28.79 kg/m2  SpO2 98% General -- alert, well-developed, NAD.  Neck  --no thyromegaly  Lungs -- normal respiratory effort, no intercostal retractions, no accessory muscle use, and normal breath sounds.  Heart-- normal rate, regular rhythm, no murmur.  Extremities-- no pretibial edema bilaterally  Neurologic--  alert & oriented X3. Speech normal, gait normal, strength normal in all extremities.  Psych-- Cognition and judgment appear intact. Cooperative with normal attention span and concentration. No anxious appearing , no depressed appearing.      Assessment & Plan:  Had a flu shot already

## 2013-09-19 NOTE — Assessment & Plan Note (Signed)
BP slightly above normal today, no change in medications, recommend self-monitoring, check a BMP

## 2013-09-19 NOTE — Patient Instructions (Addendum)
Next visit in 2-3 months (physical, fasting)  Check the  blood pressure 2 or 3 times a week, be sure it is between 110/60 and 140/85. If it is consistently higher or lower, let me know  Diabetes and Foot Care Diabetes may cause you to have a poor blood supply (circulation) to your legs and feet. Because of this, the skin may be thinner, break easier, and heal more slowly. You also may have nerve damage in your legs and feet causing decreased feeling. You may not notice minor injuries to your feet that could lead to serious problems or infections. Taking care of your feet is one of the most important things you can do for yourself.  HOME CARE INSTRUCTIONS  Do not go barefoot. Bare feet are easily injured.  Check your feet daily for blisters, cuts, and redness.  Wash your feet with warm water (not hot) and mild soap. Pat your feet and between your toes until completely dry.  Apply a moisturizing lotion that does not contain alcohol or petroleum jelly to the dry skin on your feet and to dry brittle toenails. Do not put it between your toes.  Trim your toenails straight across. Do not dig under them or around the cuticle.  Do not cut corns or calluses, or try to remove them with medicine.  Wear clean cotton socks or stockings every day. Make sure they are not too tight. Do not wear knee high stockings since they may decrease blood flow to your legs.  Wear leather shoes that fit properly and have enough cushioning. To break in new shoes, wear them just a few hours a day to avoid injuring your feet.  Wear shoes at all times, even in the house.  Do not cross your legs. This may decrease the blood flow to your feet.  If you find a minor scrape, cut, or break in the skin on your feet, keep it and the skin around it clean and dry. These areas may be cleansed with mild soap and water. Do not use peroxide, alcohol, iodine or Merthiolate.  When you remove an adhesive bandage, be sure not to harm the  skin around it.  If you have a wound, look at it several times a day to make sure it is healing.  Do not use heating pads or hot water bottles. Burns can occur. If you have lost feeling in your feet or legs, you may not know it is happening until it is too late.  Report any cuts, sores or bruises to your caregiver. Do not wait! SEEK MEDICAL CARE IF:   You have an injury that is not healing or you notice redness, numbness, burning, or tingling.  Your feet always feel cold.  You have pain or cramps in your legs and feet. SEEK IMMEDIATE MEDICAL CARE IF:   There is increasing redness, swelling, or increasing pain in the wound.  There is a red line that goes up your leg.  Pus is coming from a wound.  You develop an unexplained oral temperature above 102 F (38.9 C), or as your caregiver suggests.  You notice a bad smell coming from an ulcer or wound. MAKE SURE YOU:   Understand these instructions.  Will watch your condition.  Will get help right away if you are not doing well or get worse. Document Released: 11/27/2000 Document Revised: 02/22/2012 Document Reviewed: 06/05/2009 [facility] Patient Information 2014 [place], [place].

## 2013-09-19 NOTE — Assessment & Plan Note (Signed)
Doing well lately, has not required meloxicam in a while, will call if any refill is needed

## 2013-09-19 NOTE — Assessment & Plan Note (Signed)
Good compliance, check LFTs

## 2013-09-29 ENCOUNTER — Other Ambulatory Visit: Payer: Self-pay | Admitting: Internal Medicine

## 2013-09-29 NOTE — Telephone Encounter (Signed)
Refill for lexapro refused: requested too soon

## 2013-10-03 ENCOUNTER — Other Ambulatory Visit: Payer: Self-pay | Admitting: Internal Medicine

## 2013-10-11 ENCOUNTER — Other Ambulatory Visit: Payer: Self-pay | Admitting: *Deleted

## 2013-10-11 MED ORDER — PANTOPRAZOLE SODIUM 40 MG PO TBEC: 40.0000 mg | DELAYED_RELEASE_TABLET | Freq: Every day | ORAL | Status: DC

## 2013-10-11 NOTE — Telephone Encounter (Signed)
Protonix refill sent to [facility] pharmacy

## 2013-11-17 ENCOUNTER — Other Ambulatory Visit: Payer: Self-pay | Admitting: Internal Medicine

## 2013-11-17 NOTE — Telephone Encounter (Signed)
escitalopram refilled per protocol

## 2013-12-04 ENCOUNTER — Other Ambulatory Visit: Payer: Self-pay | Admitting: Internal Medicine

## 2013-12-11 ENCOUNTER — Other Ambulatory Visit: Payer: Self-pay | Admitting: Internal Medicine

## 2013-12-11 NOTE — Telephone Encounter (Signed)
Simvastatin and Escitalopram refilled per protocol. JG//CMA

## 2014-01-08 ENCOUNTER — Other Ambulatory Visit: Payer: Self-pay | Admitting: Internal Medicine

## 2014-01-08 NOTE — Telephone Encounter (Signed)
Pantoprazole refilled per protocol. JG//CMA 

## 2014-01-09 ENCOUNTER — Telehealth: Payer: Self-pay

## 2014-01-09 NOTE — Telephone Encounter (Signed)
Medication and allergies:  Reviewed and updated  90 day supply/mail order: [facility] Local pharmacy: [facility] [address] or [facility]   Immunizations due:  UTD  A/P:   No changes to FH, PSH or Personal Hx Flu vaccine--08/2013 Tdap--10/2009 PNA--12/2005 Shingles--01/2008 MMG--06/2013--benign findings Bone Density--05/2012--low bone mass CCS--03/2006--q 10 per patient Eye Exam--04/2013--no retinopathy  To Discuss with Provider: Please give patient a new glucose meter

## 2014-01-10 ENCOUNTER — Encounter: Payer: Self-pay | Admitting: Internal Medicine

## 2014-01-11 ENCOUNTER — Encounter: Payer: Self-pay | Admitting: Internal Medicine

## 2014-01-11 ENCOUNTER — Ambulatory Visit (INDEPENDENT_AMBULATORY_CARE_PROVIDER_SITE_OTHER): Payer: Medicare Other | Admitting: Internal Medicine

## 2014-01-11 VITALS — BP 149/77 | HR 69 | Temp 98.2°F | Wt 149.0 lb

## 2014-01-11 DIAGNOSIS — E119 Type 2 diabetes mellitus without complications: Secondary | ICD-10-CM

## 2014-01-11 DIAGNOSIS — Z0000 Encounter for general adult medical examination without abnormal findings: Secondary | ICD-10-CM

## 2014-01-11 DIAGNOSIS — Z23 Encounter for immunization: Secondary | ICD-10-CM

## 2014-01-11 DIAGNOSIS — E785 Hyperlipidemia, unspecified: Secondary | ICD-10-CM

## 2014-01-11 DIAGNOSIS — I10 Essential (primary) hypertension: Secondary | ICD-10-CM

## 2014-01-11 LAB — LIPID PANEL
Cholesterol: 154 mg/dL (ref 0–200)
HDL: 44.7 mg/dL (ref >39.00)
Total CHOL/HDL Ratio: 3
Triglycerides: 219 mg/dL — ABNORMAL HIGH (ref 0.0–149.0)
VLDL: 43.8 mg/dL — ABNORMAL HIGH (ref 0.0–40.0)

## 2014-01-11 LAB — ALT: ALT: 19 U/L (ref 0–35)

## 2014-01-11 LAB — AST: AST: 19 U/L (ref 0–37)

## 2014-01-11 LAB — LDL CHOLESTEROL, DIRECT: Direct LDL: 86.8 mg/dL

## 2014-01-11 LAB — HEMOGLOBIN A1C: HEMOGLOBIN A1C: 6.6 % — AB (ref 4.6–6.5)

## 2014-01-11 LAB — TSH: TSH: 1 u[IU]/mL (ref 0.35–5.50)

## 2014-01-11 MED ORDER — GLUCOSE BLOOD VI STRP: ORAL_STRIP | Status: DC

## 2014-01-11 NOTE — Assessment & Plan Note (Signed)
Again BP slightly elevated today but well controlled at home. Recommend monitoring, return to the office in 4 months

## 2014-01-11 NOTE — Assessment & Plan Note (Addendum)
Good compliance w/ medication,   morning CBGs in the 120s. Continue with meds, check the A1c. Continue with healthy lifestyle. Eye Exam--04/2013--no retinopathy  Feet exam today normal. Provided a new glucometer

## 2014-01-11 NOTE — Assessment & Plan Note (Signed)
Good compliance of medication, labs 

## 2014-01-11 NOTE — Assessment & Plan Note (Signed)
Well controlled 

## 2014-01-11 NOTE — Progress Notes (Signed)
Subjective:    Patient ID: Monique Bauer, female    DOB: 07/29/1936, 78 y.o.   MRN: [id number]  HPI Here for Medicare AWV:  1. Risk factors based on Past M, S, F history: reviewed  2. Physical Activities: gym x 3 /week 3. Depression/mood: on meds, sx well controlled  4. Hearing: no problems noted w/ normal converstaion, denies problems  5. ADL's: totally independent, drives w/o problems  6. Fall Risk: no recent fall 7. Home Safety: does feel safe at home  8. Height, weight, &visual acuity: see VS, has cataracts, stable x years  9. Counseling: see assessment and plan  10. Labs ordered based on risk factors: yes  11. Referral Coordination, if needed  12. Care Plan, see a/p  13. Cognitive Assessment, motor skills, memory and cognition seems normal  In addition, we discussed the following Hypertension--good medication compliance, ambulatory BPs in the 120, 130 range GERD--on PPIs, asymptomatic Diabetes--good compliance with medications, morning blood sugars range from 120, 160. Only one time it came back 260. High cholesterol, good medication compliance   Past Medical History  Diagnosis Date  . Screening for AAA (abdominal aortic aneurysm) 11/2009    aorta U/S, no AAA  . Hyperlipidemia   . Hypertension   . Osteoarthritis   . GERD (gastroesophageal reflux disease)   . Osteopenia   . Anxiety   . Diabetes 12-2012   Past Surgical History  Procedure Laterality Date  . No past surgeries     History   Social History  . Marital Status: Married    Spouse Name: N/A    Number of Children: 3  . Years of Education: N/A   Occupational History  . retired     Social History Main Topics  . Smoking status: Former Smoker    Quit date: 10/15/1981  . Smokeless tobacco: Never Used     Comment: was a social smoker  . Alcohol Use: Yes     Comment: wine sometimes  . Drug Use: No  . Sexual Activity: Not on file   Other Topics Concern  . Not on file   Social History Narrative     3 daughters live in [place], 5 G-children, 1 GG son, married to [place]   Family History  Problem Relation Age of Onset  . Colon cancer Neg Hx   . Breast cancer Other     aunt   . Coronary artery disease Mother     M at age 79 and F  . Diabetes Mother     M, F, daughter, sister  . Hypertension Mother   . Stroke Father   . Asthma Sister     Review of Systems No  CP, SOB, lower extremity edema Denies  nausea, vomiting diarrhea Denies  blood in the stools (-) cough, sputum production  No dysuria, gross hematuria, difficulty urinating        Objective:   Physical Exam  BP 149/77  Pulse 69  Temp(Src) 98.2 F (36.8 C)  Wt 149 lb (67.586 kg)  SpO2 96% General -- alert, well-developed, NAD.  Neck --no thyromegaly , normal carotid pulse Lungs -- normal respiratory effort, no intercostal retractions, no accessory muscle use, and normal breath sounds.  Heart-- normal rate, regular rhythm, no murmur.  Abdomen-- Not distended, good bowel sounds,soft, non-tender. Non-tender barely palpable aorta at  the upper abdomen, no bruit DIABETIC FEET EXAM: No lower extremity edema Normal pedal pulses bilaterally Skin normal, , no calluses; Dystrophic nail at the left  great toe Pinprick examination of the feet normal.  Neurologic--  alert & oriented X3. Speech normal, gait normal, strength normal in all extremities.  Psych-- Cognition and judgment appear intact. Cooperative with normal attention span and concentration. No anxious or depressed appearing.      Assessment & Plan:  new glucose meter provided today

## 2014-01-11 NOTE — Assessment & Plan Note (Signed)
Td 10-2009  Pneumonia shot 2007 and today (d/t h/o DM) had shingles shot  had a flu shot already   female care  All previous PAPs wnl, plans to see gyn this year and then ?PRN  MMG -- 06-2013 (-)  Cscope 2003 and 03-2006, next 2017  diet and exercise discussed

## 2014-01-11 NOTE — Patient Instructions (Addendum)
Get your blood work before you leave   Next visit is for routine check up regards diabetes, hypertension   in 4 months  No need to come back fasting Please make an appointment       Fall Prevention and [place] cause injuries and can affect all age groups. It is possible to use preventive measures to significantly decrease the likelihood of falls. There are many simple measures which can make your home safer and prevent falls. OUTDOORS  Repair cracks and edges of walkways and driveways.  Remove high doorway thresholds.  Trim shrubbery on the main path into your home.  Have good outside lighting.  Clear walkways of tools, rocks, debris, and clutter.  Check that handrails are not broken and are securely fastened. Both sides of steps should have handrails.  Have leaves, snow, and ice cleared regularly.  Use sand or salt on walkways during winter months.  In the garage, clean up grease or oil spills. BATHROOM  Install night lights.  Install grab bars by the toilet and in the tub and shower.  Use non-skid mats or decals in the tub or shower.  Place a plastic non-slip stool in the shower to sit on, if needed.  Keep floors dry and clean up all water on the floor immediately.  Remove soap buildup in the tub or shower on a regular basis.  Secure bath mats with non-slip, double-sided rug tape.  Remove throw rugs and tripping hazards from the floors. BEDROOMS  Install night lights.  Make sure a bedside light is easy to reach.  Do not use oversized bedding.  Keep a telephone by your bedside.  Have a firm chair with side arms to use for getting dressed.  Remove throw rugs and tripping hazards from the floor. KITCHEN  Keep handles on pots and pans turned toward the center of the stove. Use back burners when possible.  Clean up spills quickly and allow time for drying.  Avoid walking on wet floors.  Avoid hot utensils and knives.  Position shelves so  they are not too high or low.  Place commonly used objects within easy reach.  If necessary, use a sturdy step stool with a grab bar when reaching.  Keep electrical cables out of the way.  Do not use floor polish or wax that makes floors slippery. If you must use wax, use non-skid floor wax.  Remove throw rugs and tripping hazards from the floor. STAIRWAYS  Never leave objects on stairs.  Place handrails on both sides of stairways and use them. Fix any loose handrails. Make sure handrails on both sides of the stairways are as long as the stairs.  Check carpeting to make sure it is firmly attached along stairs. Make repairs to worn or loose carpet promptly.  Avoid placing throw rugs at the top or bottom of stairways, or properly secure the rug with carpet tape to prevent slippage. Get rid of throw rugs, if possible.  Have an electrician put in a light switch at the top and bottom of the stairs. OTHER FALL PREVENTION TIPS  Wear low-heel or rubber-soled shoes that are supportive and fit well. Wear closed toe shoes.  When using a stepladder, make sure it is fully opened and both spreaders are firmly locked. Do not climb a closed stepladder.  Add color or contrast paint or tape to grab bars and handrails in your home. Place contrasting color strips on first and last steps.  Learn and use mobility aids  as needed. Install an electrical emergency response system.  Turn on lights to avoid dark areas. Replace light bulbs that burn out immediately. Get light switches that glow.  Arrange furniture to create clear pathways. Keep furniture in the same place.  Firmly attach carpet with non-skid or double-sided tape.  Eliminate uneven floor surfaces.  Select a carpet pattern that does not visually hide the edge of steps.  Be aware of all pets. OTHER HOME SAFETY TIPS  Set the water temperature for 120 F (48.8 C).  Keep emergency numbers on or near the telephone.  Keep smoke  detectors on every level of the home and near sleeping areas. Document Released: 11/20/2002 Document Revised: 05/31/2012 Document Reviewed: 02/19/2012 [facility] Patient Information 2014 [place].

## 2014-01-11 NOTE — Progress Notes (Signed)
Pre visit review using our clinic review tool, if applicable. No additional management support is needed unless otherwise documented below in the visit note. 

## 2014-01-11 NOTE — Assessment & Plan Note (Signed)
Asymptomatic, medications as needed

## 2014-01-12 ENCOUNTER — Telehealth: Payer: Self-pay

## 2014-01-12 ENCOUNTER — Telehealth: Payer: Self-pay | Admitting: Internal Medicine

## 2014-01-12 NOTE — Telephone Encounter (Signed)
Relevant patient education assigned to patient using Emmi. ° °

## 2014-02-05 ENCOUNTER — Other Ambulatory Visit: Payer: Self-pay | Admitting: Internal Medicine

## 2014-03-22 ENCOUNTER — Encounter: Payer: Self-pay | Admitting: Internal Medicine

## 2014-03-22 ENCOUNTER — Other Ambulatory Visit: Payer: Self-pay | Admitting: *Deleted

## 2014-03-22 MED ORDER — FREESTYLE LANCETS MISC: Status: DC

## 2014-05-11 ENCOUNTER — Ambulatory Visit (INDEPENDENT_AMBULATORY_CARE_PROVIDER_SITE_OTHER): Payer: Medicare Other | Admitting: Internal Medicine

## 2014-05-11 ENCOUNTER — Encounter: Payer: Self-pay | Admitting: Internal Medicine

## 2014-05-11 VITALS — BP 139/74 | HR 65 | Temp 98.1°F | Wt 154.0 lb

## 2014-05-11 DIAGNOSIS — E119 Type 2 diabetes mellitus without complications: Secondary | ICD-10-CM

## 2014-05-11 DIAGNOSIS — I10 Essential (primary) hypertension: Secondary | ICD-10-CM

## 2014-05-11 DIAGNOSIS — M1990 Unspecified osteoarthritis, unspecified site: Secondary | ICD-10-CM

## 2014-05-11 LAB — CBC WITH DIFFERENTIAL/PLATELET
BASOS ABS: 0 10*3/uL (ref 0.0–0.1)
Basophils Relative: 0.4 % (ref 0.0–3.0)
Eosinophils Absolute: 0.5 10*3/uL (ref 0.0–0.7)
Eosinophils Relative: 7.7 % — ABNORMAL HIGH (ref 0.0–5.0)
HCT: 39.8 % (ref 36.0–46.0)
Hemoglobin: 13.4 g/dL (ref 12.0–15.0)
LYMPHS ABS: 2.1 10*3/uL (ref 0.7–4.0)
Lymphocytes Relative: 29.6 % (ref 12.0–46.0)
MCHC: 33.7 g/dL (ref 30.0–36.0)
MCV: 86.6 fl (ref 78.0–100.0)
Monocytes Absolute: 0.5 10*3/uL (ref 0.1–1.0)
Monocytes Relative: 6.5 % (ref 3.0–12.0)
NEUTROS PCT: 55.8 % (ref 43.0–77.0)
Neutro Abs: 4 10*3/uL (ref 1.4–7.7)
Platelets: 255 10*3/uL (ref 150.0–400.0)
RBC: 4.59 Mil/uL (ref 3.87–5.11)
RDW: 13.7 % (ref 11.5–15.5)
WBC: 7.1 10*3/uL (ref 4.0–10.5)

## 2014-05-11 LAB — BASIC METABOLIC PANEL
BUN: 16 mg/dL (ref 6–23)
CO2: 29 mEq/L (ref 19–32)
CREATININE: 1 mg/dL (ref 0.4–1.2)
Calcium: 9.5 mg/dL (ref 8.4–10.5)
Chloride: 105 mEq/L (ref 96–112)
GFR: 60.13 mL/min (ref >60.00)
GLUCOSE: 110 mg/dL — AB (ref 70–99)
Potassium: 3.9 mEq/L (ref 3.5–5.1)
Sodium: 140 mEq/L (ref 135–145)

## 2014-05-11 LAB — HEMOGLOBIN A1C: Hgb A1c MFr Bld: 6.9 % — ABNORMAL HIGH (ref 4.6–6.5)

## 2014-05-11 MED ORDER — MELOXICAM 7.5 MG PO TABS: 7.5000 mg | ORAL_TABLET | Freq: Every day | ORAL | Status: DC

## 2014-05-11 NOTE — Progress Notes (Signed)
Subjective:    Patient ID: Monique Bauer, female    DOB: [birth year], 78 y.o.   MRN: [id number]  DOS:  05/11/2014 Type of  Visit: ROV She for the management of hypertension, diabetes and DJD. Good medication compliance, but ambulatory BPs on CBGs. She was taking meloxicam rx by ortho but she ran out, she now realizes that it was helping w/  aches and pains and request a prescription   ROS Denies chest pain or difficulty breathing No  nausea, vomiting, diarrhea, change in the color of his stools or abdominal pain  Past Medical History  Diagnosis Date  . Screening for AAA (abdominal aortic aneurysm) 11/2009    aorta U/S, no AAA  . Hyperlipidemia   . Hypertension   . Osteoarthritis   . GERD (gastroesophageal reflux disease)   . Osteopenia   . Anxiety   . Diabetes 12-2012    Past Surgical History  Procedure Laterality Date  . No past surgeries      History   Social History  . Marital Status: Married    Spouse Name: Ronny    Number of Children: 3  . Years of Education: N/A   Occupational History  . retired    .     Social History Main Topics  . Smoking status: Former Smoker    Quit date: 10/15/1981  . Smokeless tobacco: Never Used     Comment: was a social smoker  . Alcohol Use: Yes     Comment: wine sometimes  . Drug Use: No  . Sexual Activity: Not on file   Other Topics Concern  . Not on file   Social History Narrative    3 daughters live in [place], 5 G-children, 1 GG son, married x 3         Medication List       This list is accurate as of: 05/11/14 11:59 PM.  Always use your most recent med list.               ALPRAZolam 0.25 MG tablet  Commonly known as:  XANAX  Take 0.25 mg by mouth 3 (three) times daily as needed for sleep.     aspirin 81 MG tablet  Take 81 mg by mouth daily.     CALCIUM + D PO  Take by mouth.     escitalopram 10 MG tablet  Commonly known as:  LEXAPRO  Take 1 tablet by mouth  daily     freestyle lancets  Use as  instructed     glucosamine-chondroitin 500-400 MG tablet  Take 1 tablet by mouth 3 (three) times daily.     glucose blood test strip  Use one test strip each time glucose levels are tested. Pt tests one time daily. DX 250.00     lisinopril-hydrochlorothiazide 10-12.5 MG per tablet  Commonly known as:  PRINZIDE,ZESTORETIC  Take 1 tablet by mouth daily.     meloxicam 7.5 MG tablet  Commonly known as:  MOBIC  Take 1 tablet (7.5 mg total) by mouth daily.     metFORMIN 500 MG tablet  Commonly known as:  GLUCOPHAGE  Take 1 tablet by mouth 2  times daily with a meal.     multivitamin capsule  Take 1 capsule by mouth daily.     pantoprazole 40 MG tablet  Commonly known as:  PROTONIX  Take 1 tablet by mouth  daily     simvastatin 80 MG tablet  Commonly known as:  ZOCOR  Take 1 tablet (80 mg total) by mouth at bedtime.           Objective:   Physical Exam BP 139/74  Pulse 65  Temp(Src) 98.1 F (36.7 C) (Oral)  Wt 154 lb (69.854 kg)  SpO2 93% General -- alert, well-developed, NAD.   HEENT-- Not pale.   Lungs -- normal respiratory effort, no intercostal retractions, no accessory muscle use, and normal breath sounds.  Heart-- normal rate, regular rhythm, no murmur.  A Extremities-- no pretibial edema bilaterally  Neurologic--  alert & oriented X3. Speech normal, gait normal, strength normal in all extremities.   Psych-- Cognition and judgment appear intact. Cooperative with normal attention span and concentration. No anxious or depressed appearing.        Assessment & Plan:

## 2014-05-11 NOTE — Patient Instructions (Signed)
Get your blood work before you leave     Next visit is for routine check up in 6 months  

## 2014-05-11 NOTE — Assessment & Plan Note (Signed)
Orthopedic surgery was prescribing meloxicam and she was taking it regularly then run out. Request a prescription because she realizes it was helping aches and pains. Had no GI side effects. BMP 09-2013 while on daily   meloxicam was normal. Plan: Refill of meloxicam, take it as needed, maybe one tablet every other day will help just as much. The patient will try qod

## 2014-05-11 NOTE — Assessment & Plan Note (Signed)
Ambulatory CBGs from 120, Labs

## 2014-05-11 NOTE — Assessment & Plan Note (Signed)
amb  BPs consistently 120/80, no change, take labs

## 2014-05-11 NOTE — Progress Notes (Signed)
Pre-visit discussion using our clinic review tool. No additional management support is needed unless otherwise documented below in the visit note.  

## 2014-05-12 ENCOUNTER — Other Ambulatory Visit: Payer: Self-pay | Admitting: Internal Medicine

## 2014-05-19 ENCOUNTER — Other Ambulatory Visit: Payer: Self-pay | Admitting: Internal Medicine

## 2014-06-06 ENCOUNTER — Encounter: Payer: Self-pay | Admitting: Internal Medicine

## 2014-06-07 ENCOUNTER — Other Ambulatory Visit: Payer: Self-pay | Admitting: Internal Medicine

## 2014-06-07 DIAGNOSIS — Z0000 Encounter for general adult medical examination without abnormal findings: Secondary | ICD-10-CM

## 2014-07-02 LAB — HM DEXA SCAN

## 2014-07-02 LAB — HM MAMMOGRAPHY: HM Mammogram: NORMAL

## 2014-07-09 ENCOUNTER — Encounter: Payer: Self-pay | Admitting: Internal Medicine

## 2014-07-13 ENCOUNTER — Encounter: Payer: Self-pay | Admitting: Internal Medicine

## 2014-07-16 ENCOUNTER — Encounter: Payer: Self-pay | Admitting: Internal Medicine

## 2014-07-19 ENCOUNTER — Telehealth: Payer: Self-pay | Admitting: Internal Medicine

## 2014-07-19 NOTE — Telephone Encounter (Signed)
Advised patient per recommendations. Verbalizes understanding.

## 2014-07-19 NOTE — Telephone Encounter (Signed)
Advise patient, bone density test showed osteopenia, recommend to stay active, ca vitamin D supplements

## 2014-07-23 ENCOUNTER — Other Ambulatory Visit: Payer: Self-pay | Admitting: Internal Medicine

## 2014-08-10 ENCOUNTER — Encounter: Payer: Self-pay | Admitting: Internal Medicine

## 2014-09-04 ENCOUNTER — Other Ambulatory Visit: Payer: Self-pay | Admitting: Internal Medicine

## 2014-09-05 ENCOUNTER — Telehealth: Payer: Self-pay | Admitting: *Deleted

## 2014-09-05 ENCOUNTER — Ambulatory Visit (INDEPENDENT_AMBULATORY_CARE_PROVIDER_SITE_OTHER): Payer: Medicare Other

## 2014-09-05 DIAGNOSIS — Z23 Encounter for immunization: Secondary | ICD-10-CM

## 2014-09-05 NOTE — Telephone Encounter (Signed)
Pt requesting more information regarding her weight goal. Pt wants to know what her final target weight should be for her age. Please advise

## 2014-09-06 NOTE — Telephone Encounter (Signed)
Pt returned your call please call [phone]

## 2014-09-06 NOTE — Telephone Encounter (Signed)
Her BMI is 30, if she could go closer to a BMI of 28 in one year is  great. Recommend gradual weight loss, goal 145  pounds in one year

## 2014-09-06 NOTE — Telephone Encounter (Signed)
LMOM for Pt to return call.  

## 2014-09-13 LAB — HM DIABETES EYE EXAM

## 2014-09-28 ENCOUNTER — Other Ambulatory Visit: Payer: Self-pay

## 2014-09-29 ENCOUNTER — Encounter: Payer: Self-pay | Admitting: Internal Medicine

## 2014-09-29 ENCOUNTER — Other Ambulatory Visit: Payer: Self-pay | Admitting: Internal Medicine

## 2014-10-01 ENCOUNTER — Other Ambulatory Visit: Payer: Self-pay

## 2014-10-01 MED ORDER — LISINOPRIL-HYDROCHLOROTHIAZIDE 10-12.5 MG PO TABS: ORAL_TABLET | ORAL | Status: DC

## 2014-10-04 ENCOUNTER — Other Ambulatory Visit: Payer: Self-pay

## 2014-10-04 MED ORDER — LISINOPRIL-HYDROCHLOROTHIAZIDE 10-12.5 MG PO TABS: ORAL_TABLET | ORAL | Status: DC

## 2014-11-12 ENCOUNTER — Ambulatory Visit: Payer: Medicare Other | Admitting: Internal Medicine

## 2014-11-13 LAB — HM DIABETES FOOT EXAM: HM Diabetic Foot Exam: NORMAL

## 2014-11-26 ENCOUNTER — Ambulatory Visit (INDEPENDENT_AMBULATORY_CARE_PROVIDER_SITE_OTHER): Payer: Medicare Other | Admitting: Internal Medicine

## 2014-11-26 ENCOUNTER — Encounter: Payer: Self-pay | Admitting: Internal Medicine

## 2014-11-26 VITALS — BP 122/72 | HR 89 | Temp 98.4°F | Wt 144.4 lb

## 2014-11-26 DIAGNOSIS — E119 Type 2 diabetes mellitus without complications: Secondary | ICD-10-CM

## 2014-11-26 DIAGNOSIS — J069 Acute upper respiratory infection, unspecified: Secondary | ICD-10-CM

## 2014-11-26 DIAGNOSIS — I10 Essential (primary) hypertension: Secondary | ICD-10-CM

## 2014-11-26 MED ORDER — GLUCOSE BLOOD VI STRP: ORAL_STRIP | Status: DC

## 2014-11-26 MED ORDER — ONETOUCH ULTRASOFT LANCETS MISC: Status: DC

## 2014-11-26 NOTE — Progress Notes (Signed)
Pre visit review using our clinic review tool, if applicable. No additional management support is needed unless otherwise documented below in the visit note. 

## 2014-11-26 NOTE — Progress Notes (Signed)
Subjective:    Patient ID: Monique Bauer, female    DOB: 1936/09/07, 78 y.o.   MRN: [id number]  DOS:  11/26/2014 Type of visit - description : rov Interval history: Diabetes, doing great with diet and exercise,  lost 10 pound Hypertension, good medication compliance, ambulatory BPs within normal Developed a URI 4 days ago, started with sneezing and a mild sore throat, yesterday symptoms increase: Fever, chills, aches, some nausea. Today she feels better,  + cough but not nearly as sick as before.   ROS Denies chest pain or difficulty breathing No diarrhea or blood in the stools No sputum production or wheezing.  Past Medical History  Diagnosis Date  . Screening for AAA (abdominal aortic aneurysm) 11/2009    aorta U/S, no AAA  . Hyperlipidemia   . Hypertension   . Osteoarthritis   . GERD (gastroesophageal reflux disease)   . Osteopenia   . Anxiety   . Diabetes 12-2012    Past Surgical History  Procedure Laterality Date  . No past surgeries      History   Social History  . Marital Status: Married    Spouse Name: Ronny    Number of Children: 3  . Years of Education: N/A   Occupational History  . retired    .     Social History Main Topics  . Smoking status: Former Smoker    Quit date: 10/15/1981  . Smokeless tobacco: Never Used     Comment: was a social smoker  . Alcohol Use: Yes     Comment: wine sometimes  . Drug Use: No  . Sexual Activity: Not on file   Other Topics Concern  . Not on file   Social History Narrative    3 daughters live in [place], 5 G-children, 1 GG son, married x 3         Medication List       This list is accurate as of: 11/26/14  6:27 PM.  Always use your most recent med list.               ALPRAZolam 0.25 MG tablet  Commonly known as:  XANAX  Take 0.25 mg by mouth 3 (three) times daily as needed for sleep.     aspirin 81 MG tablet  Take 81 mg by mouth daily.     CALCIUM + D PO  Take by mouth.     cycloSPORINE  0.05 % ophthalmic emulsion  Commonly known as:  RESTASIS  Place 1 drop into both eyes 2 (two) times daily.     escitalopram 10 MG tablet  Commonly known as:  LEXAPRO  Take 1 tablet by mouth  daily     glucosamine-chondroitin 500-400 MG tablet  Take 1 tablet by mouth 3 (three) times daily.     glucose blood test strip  Check blood sugars once daily.     lisinopril-hydrochlorothiazide 10-12.5 MG per tablet  Commonly known as:  PRINZIDE,ZESTORETIC  Take 1 tablet by mouth  daily     meloxicam 7.5 MG tablet  Commonly known as:  MOBIC  Take 1 tablet by mouth  daily     metFORMIN 500 MG tablet  Commonly known as:  GLUCOPHAGE  Take 1 tablet by mouth  twice a day with meals     multivitamin capsule  Take 1 capsule by mouth daily.     onetouch ultrasoft lancets  Check blood sugars once daily.     pantoprazole 40 MG tablet  Commonly known as:  PROTONIX  Take 1 tablet by mouth  daily     simvastatin 80 MG tablet  Commonly known as:  ZOCOR  Take 1 tablet by mouth at  bedtime           Objective:   Physical Exam BP 122/72 mmHg  Pulse 89  Temp(Src) 98.4 F (36.9 C) (Oral)  Wt 144 lb 6 oz (65.488 kg)  SpO2 94% General -- alert, well-developed, NAD.  HEENT-- Not pale.  R Ear-- normal L ear-- normal Throat symmetric, no redness or discharge. Face symmetric, sinuses not tender to palpation. Nose  congested. Lungs -- normal respiratory effort, no intercostal retractions, no accessory muscle use, and normal breath sounds.  Heart-- normal rate, regular rhythm, no murmur.  DIABETIC FEET EXAM: No lower extremity edema Normal pedal pulses bilaterally Skin normal, nails normal, no calluses Pinprick examination of the feet normal.\  Neurologic--  alert & oriented X3. Speech normal, gait appropriate for age, strength symmetric and appropriate for age.  Psych-- Cognition and judgment appear intact. Cooperative with normal attention span and concentration. No anxious or depressed  appearing.     Assessment & Plan:   Hypertension, well controlled, continue with lisinopril, HCTZ. Check a BMP Diabetes, Doing great with lifestyle, lost about 10 pounds, continue with metformin, check A1c Reports a recent eye check which was negative. Feet exam today negative New glucometer provided today URI, see instructions   Also, needs a letter stating what her ideal weight is, current BMI is 27, I would like to see her closely closer to a BMI of 25 , for now i rec a weight goal of 135  pounds. Letter printed

## 2014-11-26 NOTE — Patient Instructions (Signed)
Get your blood work before you leave   Rest, fluids , tylenol If  cough, take Mucinex DM twice a day as needed  If nasal  congestion use OTC Nasocort or Flonase : 2 nasal sprays on each side of the nose daily until you feel better  Call if not gradually better over the next  10 days Call anytime if the symptoms are severe   Please come back to the office in 3 months  for a physical exam. Come back fasting

## 2014-11-27 ENCOUNTER — Telehealth: Payer: Self-pay

## 2014-11-27 LAB — BASIC METABOLIC PANEL
BUN: 22 mg/dL (ref 6–23)
CO2: 24 meq/L (ref 19–32)
CREATININE: 1 mg/dL (ref 0.4–1.2)
Calcium: 9.4 mg/dL (ref 8.4–10.5)
Chloride: 105 mEq/L (ref 96–112)
GFR: 54.74 mL/min — ABNORMAL LOW (ref >60.00)
Glucose, Bld: 127 mg/dL — ABNORMAL HIGH (ref 70–99)
POTASSIUM: 3.7 meq/L (ref 3.5–5.1)
SODIUM: 137 meq/L (ref 135–145)

## 2014-11-27 LAB — HEMOGLOBIN A1C: HEMOGLOBIN A1C: 6.8 % — AB (ref 4.6–6.5)

## 2014-11-27 NOTE — Telephone Encounter (Signed)
Letter printed and mailed to Pt.  

## 2014-11-27 NOTE — Telephone Encounter (Signed)
-----   Message from Colon Branch, MD sent at 11/26/2014  6:34 PM EST ----- Regarding: Letter Please print a letter: To whom it may concern, Mrs. Monique Bauer is a patient of mine, her ideal weight is 135 pounds.

## 2014-12-16 ENCOUNTER — Other Ambulatory Visit: Payer: Self-pay | Admitting: Internal Medicine

## 2015-03-13 ENCOUNTER — Encounter: Payer: Self-pay | Admitting: Internal Medicine

## 2015-03-20 ENCOUNTER — Encounter: Payer: Medicare Other | Admitting: Internal Medicine

## 2015-04-01 ENCOUNTER — Other Ambulatory Visit: Payer: Self-pay

## 2015-04-01 ENCOUNTER — Encounter: Payer: Self-pay | Admitting: Internal Medicine

## 2015-04-01 MED ORDER — GLUCOSE BLOOD VI STRP: ORAL_STRIP | Status: DC

## 2015-04-01 MED ORDER — ONETOUCH ULTRASOFT LANCETS MISC: Status: DC

## 2015-04-16 ENCOUNTER — Telehealth: Payer: Self-pay | Admitting: Internal Medicine

## 2015-04-16 MED ORDER — METFORMIN HCL 500 MG PO TABS
500.0000 mg | ORAL_TABLET | Freq: Two times a day (BID) | ORAL | Status: DC
Start: 2015-04-16 — End: 2015-04-19

## 2015-04-16 NOTE — Telephone Encounter (Signed)
Noted  

## 2015-04-16 NOTE — Telephone Encounter (Signed)
Caller name: ron Relation to pt: husband Call back number: [phone] Pharmacy: [facility] on [address]   Reason for call:   Patient is out of metformin and would like an emergency supply sent to local pharmacy. Waiting on mail order to be delivered.   Requesting callback when sent

## 2015-04-16 NOTE — Telephone Encounter (Signed)
30 day supply for Metformin sent to [facility] as requested. Pt was due for routine F/U in March, not recommended she wait until June to be seen again.

## 2015-04-16 NOTE — Telephone Encounter (Signed)
Pt scheduled for 04/19/2015

## 2015-04-16 NOTE — Telephone Encounter (Signed)
Left message for patient to return my call.

## 2015-04-19 ENCOUNTER — Ambulatory Visit (INDEPENDENT_AMBULATORY_CARE_PROVIDER_SITE_OTHER): Payer: Medicare Other | Admitting: Internal Medicine

## 2015-04-19 ENCOUNTER — Encounter: Payer: Self-pay | Admitting: Internal Medicine

## 2015-04-19 VITALS — BP 122/78 | HR 63 | Temp 97.8°F | Wt 141.5 lb

## 2015-04-19 DIAGNOSIS — F419 Anxiety disorder, unspecified: Secondary | ICD-10-CM | POA: Diagnosis not present

## 2015-04-19 DIAGNOSIS — E785 Hyperlipidemia, unspecified: Secondary | ICD-10-CM | POA: Diagnosis not present

## 2015-04-19 DIAGNOSIS — Z23 Encounter for immunization: Secondary | ICD-10-CM | POA: Diagnosis not present

## 2015-04-19 DIAGNOSIS — E119 Type 2 diabetes mellitus without complications: Secondary | ICD-10-CM | POA: Diagnosis not present

## 2015-04-19 MED ORDER — PANTOPRAZOLE SODIUM 40 MG PO TBEC: 40.0000 mg | DELAYED_RELEASE_TABLET | Freq: Every day | ORAL | Status: DC

## 2015-04-19 MED ORDER — METFORMIN HCL 500 MG PO TABS: 500.0000 mg | ORAL_TABLET | Freq: Two times a day (BID) | ORAL | Status: DC

## 2015-04-19 MED ORDER — LISINOPRIL-HYDROCHLOROTHIAZIDE 10-12.5 MG PO TABS: 1.0000 | ORAL_TABLET | Freq: Every day | ORAL | Status: DC

## 2015-04-19 MED ORDER — SIMVASTATIN 80 MG PO TABS: 80.0000 mg | ORAL_TABLET | Freq: Every day | ORAL | Status: DC

## 2015-04-19 MED ORDER — ALPRAZOLAM 0.25 MG PO TABS: 0.2500 mg | ORAL_TABLET | Freq: Three times a day (TID) | ORAL | Status: DC | PRN

## 2015-04-19 MED ORDER — ESCITALOPRAM OXALATE 10 MG PO TABS: 10.0000 mg | ORAL_TABLET | Freq: Every day | ORAL | Status: DC

## 2015-04-19 NOTE — Patient Instructions (Signed)
Please schedule labs to be done within few days (fasting)  Come back to the office by 08-2015  for a physical exam  Please schedule an appointment at the front desk    No need to come back fasting
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
## 2015-04-19 NOTE — Progress Notes (Signed)
Pre visit review using our clinic review tool, if applicable. No additional management support is needed unless otherwise documented below in the visit note. 

## 2015-04-19 NOTE — Assessment & Plan Note (Signed)
Since the last visit, not doing as well with lifestyle. Good compliance of medication, check A1c.
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
## 2015-04-19 NOTE — Assessment & Plan Note (Signed)
Continue simvastatin 80 mg, check FLP, AST, ALT

## 2015-04-24 ENCOUNTER — Other Ambulatory Visit: Payer: Medicare Other

## 2015-04-24 LAB — LIPID PANEL
CHOL/HDL RATIO: 3
Cholesterol: 170 mg/dL (ref 0–200)
HDL: 53.2 mg/dL (ref >39.00)
LDL Cholesterol: 85 mg/dL (ref 0–99)
NONHDL: 116.8
Triglycerides: 159 mg/dL — ABNORMAL HIGH (ref 0.0–149.0)
VLDL: 31.8 mg/dL (ref 0.0–40.0)

## 2015-04-24 LAB — HEMOGLOBIN A1C: Hgb A1c MFr Bld: 6.6 % — ABNORMAL HIGH (ref 4.6–6.5)

## 2015-04-24 LAB — ALT: ALT: 15 U/L (ref 0–35)

## 2015-04-24 LAB — AST: AST: 18 U/L (ref 0–37)

## 2015-05-15 ENCOUNTER — Ambulatory Visit: Payer: Self-pay | Admitting: Internal Medicine

## 2015-06-21 ENCOUNTER — Other Ambulatory Visit: Payer: Self-pay | Admitting: Internal Medicine

## 2015-07-15 LAB — HM MAMMOGRAPHY

## 2015-08-15 ENCOUNTER — Telehealth: Payer: Self-pay | Admitting: Internal Medicine

## 2015-08-15 NOTE — Telephone Encounter (Signed)
pre visit letter mailed 08/15/15

## 2015-08-22 ENCOUNTER — Telehealth: Payer: Self-pay

## 2015-08-22 NOTE — Telephone Encounter (Signed)
Called to schedule Medicare Wellness Visit with Health Coach.  Left a message for call back.  

## 2015-08-24 ENCOUNTER — Emergency Department (HOSPITAL_BASED_OUTPATIENT_CLINIC_OR_DEPARTMENT_OTHER)
Admission: EM | Admit: 2015-08-24 | Discharge: 2015-08-25 | Disposition: A | Discharge status: Home / Self Care | Payer: Medicare Other | Attending: Emergency Medicine | Admitting: Emergency Medicine

## 2015-08-24 ENCOUNTER — Encounter (HOSPITAL_BASED_OUTPATIENT_CLINIC_OR_DEPARTMENT_OTHER): Payer: Self-pay | Admitting: Emergency Medicine

## 2015-08-24 DIAGNOSIS — Z79899 Other long term (current) drug therapy: Secondary | ICD-10-CM | POA: Diagnosis not present

## 2015-08-24 DIAGNOSIS — E785 Hyperlipidemia, unspecified: Secondary | ICD-10-CM | POA: Diagnosis not present

## 2015-08-24 DIAGNOSIS — L259 Unspecified contact dermatitis, unspecified cause: Secondary | ICD-10-CM | POA: Insufficient documentation

## 2015-08-24 DIAGNOSIS — R21 Rash and other nonspecific skin eruption: Secondary | ICD-10-CM | POA: Diagnosis present

## 2015-08-24 DIAGNOSIS — F419 Anxiety disorder, unspecified: Secondary | ICD-10-CM | POA: Diagnosis not present

## 2015-08-24 DIAGNOSIS — Z791 Long term (current) use of non-steroidal anti-inflammatories (NSAID): Secondary | ICD-10-CM | POA: Insufficient documentation

## 2015-08-24 DIAGNOSIS — M1990 Unspecified osteoarthritis, unspecified site: Secondary | ICD-10-CM | POA: Insufficient documentation

## 2015-08-24 DIAGNOSIS — E119 Type 2 diabetes mellitus without complications: Secondary | ICD-10-CM | POA: Diagnosis not present

## 2015-08-24 DIAGNOSIS — M8580 Other specified disorders of bone density and structure, unspecified site: Secondary | ICD-10-CM | POA: Diagnosis not present

## 2015-08-24 DIAGNOSIS — Z87891 Personal history of nicotine dependence: Secondary | ICD-10-CM | POA: Insufficient documentation

## 2015-08-24 DIAGNOSIS — K219 Gastro-esophageal reflux disease without esophagitis: Secondary | ICD-10-CM | POA: Insufficient documentation

## 2015-08-24 DIAGNOSIS — Z7982 Long term (current) use of aspirin: Secondary | ICD-10-CM | POA: Diagnosis not present

## 2015-08-24 DIAGNOSIS — I10 Essential (primary) hypertension: Secondary | ICD-10-CM | POA: Diagnosis not present

## 2015-08-24 MED ORDER — PREDNISONE 50 MG PO TABS: ORAL_TABLET | ORAL | Status: DC

## 2015-08-24 MED ORDER — HYDROCORTISONE 1 % EX CREA: TOPICAL_CREAM | CUTANEOUS | Status: DC

## 2015-08-24 MED ORDER — DIPHENHYDRAMINE HCL 25 MG PO CAPS
25.0000 mg | ORAL_CAPSULE | Freq: Once | ORAL | Status: AC
  Administered 2015-08-24: 25 mg via ORAL
  Filled 2015-08-24: qty 1

## 2015-08-24 NOTE — ED Provider Notes (Signed)
CSN: [id number]     Arrival date & time 08/24/15  2000 History  This chart was scribed for Ezequiel Essex, MD by Meriel Pica, ED Scribe. This patient was seen in room [facility]1/[facility] and the patient's care was started 10:47 PM.   Chief Complaint  Patient presents with  . Rash   HPI HPI Comments: Monique Bauer is a 79 y.o. female, with a PMhx of DM controlled with metformin BID, who presents to the Emergency Department complaining of a sudden onset, progressively worsening, erythematous, pruritic rash to arm, chest and surrounding bilateral eyes onset this morning. There is swelling in affected areas. Pt reports she was working outside pulling weeds yesterday. She has applied a topical anti-itch cream and taken benadryl with no relief. Pt has come in contact with poison ivy in the past for which she took steroids for. Her normal CBG runs 100-115. She denies trouble breathing or swallowing, any pain associated with rash, CP, any new medication or foods, abdominal pain, or blurred vision. The pt has an appointment with PCP this upcoming week.   Past Medical History  Diagnosis Date  . Screening for AAA (abdominal aortic aneurysm) 11/2009    aorta U/S, no AAA  . Hyperlipidemia   . Hypertension   . Osteoarthritis   . GERD (gastroesophageal reflux disease)   . Osteopenia   . Anxiety   . Diabetes 12-2012   Past Surgical History  Procedure Laterality Date  . No past surgeries     Family History  Problem Relation Age of Onset  . Colon cancer Neg Hx   . Breast cancer Other     aunt   . Coronary artery disease Mother     M at age 3 and F  . Diabetes Mother     M, F, daughter, sister  . Hypertension Mother   . Stroke Father   . Asthma Sister    Social History  Substance Use Topics  . Smoking status: Former Smoker    Quit date: 10/15/1981  . Smokeless tobacco: Never Used     Comment: was a social smoker  . Alcohol Use: Yes     Comment: wine sometimes   OB History    No data  available     Review of Systems  HENT: Negative for trouble swallowing.   Eyes: Negative for visual disturbance.  Respiratory: Negative for shortness of breath.   Cardiovascular: Negative for chest pain.  Gastrointestinal: Negative for abdominal pain.  Skin: Positive for rash.   A complete 10 system review of systems was obtained and is otherwise negative except at noted in the HPI and PMH.  Allergies  Sulfamethoxazole-trimethoprim  Home Medications   Prior to Admission medications   Medication Sig Start Date End Date Taking? Authorizing Provider  ALPRAZolam (XANAX) 0.25 MG tablet Take 1 tablet (0.25 mg total) by mouth 3 (three) times daily as needed for sleep. 04/19/15   Colon Branch, MD  aspirin 81 MG tablet Take 81 mg by mouth daily.      Historical Provider, MD  Calcium Carbonate-Vitamin D (CALCIUM + D PO) Take by mouth.      Historical Provider, MD  cycloSPORINE (RESTASIS) 0.05 % ophthalmic emulsion Place 1 drop into both eyes 2 (two) times daily.    Historical Provider, MD  escitalopram (LEXAPRO) 10 MG tablet Take 1 tablet (10 mg total) by mouth daily. 04/19/15   Colon Branch, MD  glucosamine-chondroitin 500-400 MG tablet Take 1 tablet by mouth 3 (three)  times daily.      Historical Provider, MD  glucose blood test strip Check blood sugars once daily. Patient not taking: Reported on 04/19/2015 04/01/15   Colon Branch, MD  hydrocortisone cream 1 % Apply to affected area 2 times daily 08/24/15   Ezequiel Essex, MD  Lancets [facility] ULTRASOFT) lancets Check blood sugars once daily. Patient not taking: Reported on 04/19/2015 04/01/15   Colon Branch, MD  lisinopril-hydrochlorothiazide (PRINZIDE,ZESTORETIC) 10-12.5 MG per tablet Take 1 tablet by mouth daily. 04/19/15   Colon Branch, MD  meloxicam (MOBIC) 7.5 MG tablet Take 1 tablet (7.5 mg total) by mouth daily. 06/21/15   Colon Branch, MD  metFORMIN (GLUCOPHAGE) 500 MG tablet Take 1 tablet (500 mg total) by mouth 2 (two) times daily with a meal. 04/19/15    Colon Branch, MD  Multiple Vitamin (MULTIVITAMIN) capsule Take 1 capsule by mouth daily.      Historical Provider, MD  pantoprazole (PROTONIX) 40 MG tablet Take 1 tablet (40 mg total) by mouth daily. 04/19/15   Colon Branch, MD  predniSONE (DELTASONE) 50 MG tablet 1 tablet PO daily 08/24/15   Ezequiel Essex, MD  simvastatin (ZOCOR) 80 MG tablet Take 1 tablet (80 mg total) by mouth at bedtime. 04/19/15   Colon Branch, MD   BP 138/55 mmHg  Pulse 66  Temp(Src) 98.6 F (37 C) (Oral)  Resp 16  Ht 5' (1.524 m)  Wt 137 lb (62.143 kg)  BMI 26.76 kg/m2  SpO2 100% Physical Exam  Constitutional: She is oriented to person, place, and time. She appears well-developed and well-nourished. No distress.  HENT:  Head: Normocephalic and atraumatic.  Mouth/Throat: Oropharynx is clear and moist. No oropharyngeal exudate.  No tongue or lip swelling.   Eyes: Conjunctivae and EOM are normal. Pupils are equal, round, and reactive to light.  Neck: Normal range of motion. Neck supple.  No meningismus.  Cardiovascular: Normal rate, regular rhythm, normal heart sounds and intact distal pulses.   No murmur heard. Pulmonary/Chest: Effort normal and breath sounds normal. No respiratory distress.  Abdominal: Soft. There is no tenderness. There is no rebound and no guarding.  Musculoskeletal: Normal range of motion. She exhibits no edema or tenderness.  Neurological: She is alert and oriented to person, place, and time. No cranial nerve deficit. She exhibits normal muscle tone. Coordination normal.  No ataxia on finger to nose bilaterally. No pronator drift. 5/5 strength throughout. CN 2-12 intact. Negative Romberg. Equal grip strength. Sensation intact. Gait is normal.   Skin: Skin is warm.  Erythematous, papular rash with excoriation to right AC fossa, left chest and bilateral lower lids and cheeks.   Psychiatric: She has a normal mood and affect. Her behavior is normal.  Nursing note and vitals reviewed.   ED Course   Procedures  DIAGNOSTIC STUDIES: Oxygen Saturation is 100% on RA, normal by my interpretation.    COORDINATION OF CARE: 10:52 PM Discussed treatment plan which includes order and prescribe prednisone with pt. Pt acknowledges and agrees to plan.   Labs Review Labs Reviewed - No data to display  Imaging Review No results found. I have personally reviewed and evaluated these images and lab results as part of my medical decision-making.   EKG Interpretation None      MDM   Final diagnoses:  Contact dermatitis   Rash to face, chest, right arm since yesterday. Concerned she was exposed to poison ivy. No difficulty breathing or swallowing.  Discussed with  patient. She is requesting by mouth steroids. She is aware this could increase her blood sugar. She is on metformin without insulin. She agrees to check her blood sugar twice daily and follow-up with her doctor next week. Return precautions discussed.  I personally performed the services described in this documentation, which was scribed in my presence. The recorded information has been reviewed and is accurate.    Ezequiel Essex, MD 08/24/15 2325

## 2015-08-24 NOTE — Discharge Instructions (Signed)
Contact Dermatitis Monitor your blood sugar closely while on steroids. Followup with your doctor. Return to the ED if you develop new or worsening symptoms. Contact dermatitis is a reaction to certain substances that touch the skin. Contact dermatitis can be either irritant contact dermatitis or allergic contact dermatitis. Irritant contact dermatitis does not require previous exposure to the substance for a reaction to occur.Allergic contact dermatitis only occurs if you have been exposed to the substance before. Upon a repeat exposure, your body reacts to the substance.  CAUSES  Many substances can cause contact dermatitis. Irritant dermatitis is most commonly caused by repeated exposure to mildly irritating substances, such as:  Makeup.  Soaps.  Detergents.  Bleaches.  Acids.  Metal salts, such as nickel. Allergic contact dermatitis is most commonly caused by exposure to:  Poisonous plants.  Chemicals (deodorants, shampoos).  Jewelry.  Latex.  Neomycin in triple antibiotic cream.  Preservatives in products, including clothing. SYMPTOMS  The area of skin that is exposed may develop:  Dryness or flaking.  Redness.  Cracks.  Itching.  Pain or a burning sensation.  Blisters. With allergic contact dermatitis, there may also be swelling in areas such as the eyelids, mouth, or genitals.  DIAGNOSIS  Your caregiver can usually tell what the problem is by doing a physical exam. In cases where the cause is uncertain and an allergic contact dermatitis is suspected, a patch skin test may be performed to help determine the cause of your dermatitis. TREATMENT Treatment includes protecting the skin from further contact with the irritating substance by avoiding that substance if possible. Barrier creams, powders, and gloves may be helpful. Your caregiver may also recommend:  Steroid creams or ointments applied 2 times daily. For best results, soak the rash area in cool water for  20 minutes. Then apply the medicine. Cover the area with a plastic wrap. You can store the steroid cream in the refrigerator for a "chilly" effect on your rash. That may decrease itching. Oral steroid medicines may be needed in more severe cases.  Antibiotics or antibacterial ointments if a skin infection is present.  Antihistamine lotion or an antihistamine taken by mouth to ease itching.  Lubricants to keep moisture in your skin.  Burow's solution to reduce redness and soreness or to dry a weeping rash. Mix one packet or tablet of solution in 2 cups cool water. Dip a clean washcloth in the mixture, wring it out a bit, and put it on the affected area. Leave the cloth in place for 30 minutes. Do this as often as possible throughout the day.  Taking several cornstarch or baking soda baths daily if the area is too large to cover with a washcloth. Harsh chemicals, such as alkalis or acids, can cause skin damage that is like a burn. You should flush your skin for 15 to 20 minutes with cold water after such an exposure. You should also seek immediate medical care after exposure. Bandages (dressings), antibiotics, and pain medicine may be needed for severely irritated skin.  HOME CARE INSTRUCTIONS  Avoid the substance that caused your reaction.  Keep the area of skin that is affected away from hot water, soap, sunlight, chemicals, acidic substances, or anything else that would irritate your skin.  Do not scratch the rash. Scratching may cause the rash to become infected.  You may take cool baths to help stop the itching.  Only take over-the-counter or prescription medicines as directed by your caregiver.  See your caregiver for follow-up care  as directed to make sure your skin is healing properly. SEEK MEDICAL CARE IF:   Your condition is not better after 3 days of treatment.  You seem to be getting worse.  You see signs of infection such as swelling, tenderness, redness, soreness, or warmth  in the affected area.  You have any problems related to your medicines. Document Released: 11/27/2000 Document Revised: 02/22/2012 Document Reviewed: 05/05/2011 [facility] Patient Information 2015 [place], [place]. This information is not intended to replace advice given to you by your health care provider. Make sure you discuss any questions you have with your health care provider.

## 2015-08-24 NOTE — ED Notes (Signed)
Patient reports that she has been itching x today and feels like she may have been exposed to [place] or poison oak

## 2015-08-26 ENCOUNTER — Telehealth: Payer: Self-pay | Admitting: Internal Medicine

## 2015-08-26 NOTE — Telephone Encounter (Signed)
FYI-  Patient states she has a physical Wednesday 08/28/15.

## 2015-08-26 NOTE — Telephone Encounter (Signed)
Relation to PF:YTWK Call back [phone]   Reason for call:  Patient was seen in the ED 08/24/15 and predniSONE (DELTASONE) 50 MG tablet  Was prescribed and patient is unsure if she should take because she is a diabetic. Please follow up with patient directly.

## 2015-08-26 NOTE — Telephone Encounter (Signed)
Spoke with Pt, rash on face and redness in eyes are doing much better per Pt. She is still having rashes on her arms. She informed me that she was given Prednisone 50 mg 1 tablet daily for 5 days, which she did not fill the prescription because the ED doctor informed her it may cause her blood sugars to rise. She has been using Hydrocortisone cream on there rash which helps. Informed Pt that I would speak with Dr. Larose Kells for advise. Pt did state she has her CPE scheduled for 08/28/2015 at 0800.

## 2015-08-26 NOTE — Telephone Encounter (Signed)
For now, continue using the hydrocortisone, I will see her in 2 days and see how she's doing. If not better, will use prednisone, maybe a lower dose.

## 2015-08-26 NOTE — Telephone Encounter (Signed)
Please advise 

## 2015-08-26 NOTE — Telephone Encounter (Signed)
Please ask patient: Still needs it? is the dose 50 mg one time?

## 2015-08-26 NOTE — Telephone Encounter (Signed)
Spoke with Pt, informed her to continue with Hydrocortisone cream at this time, and Dr. Larose Kells will assess in 2 days at CPE. Pt verbalized understanding.

## 2015-08-27 ENCOUNTER — Ambulatory Visit (INDEPENDENT_AMBULATORY_CARE_PROVIDER_SITE_OTHER): Payer: Medicare Other | Admitting: Physician Assistant

## 2015-08-27 ENCOUNTER — Telehealth: Payer: Self-pay | Admitting: Internal Medicine

## 2015-08-27 ENCOUNTER — Encounter: Payer: Self-pay | Admitting: Physician Assistant

## 2015-08-27 VITALS — BP 124/82 | HR 65 | Temp 98.1°F | Resp 16 | Ht 60.0 in | Wt 137.5 lb

## 2015-08-27 DIAGNOSIS — L237 Allergic contact dermatitis due to plants, except food: Secondary | ICD-10-CM | POA: Insufficient documentation

## 2015-08-27 MED ORDER — PREDNISONE 10 MG PO TABS: ORAL_TABLET | ORAL | Status: DC

## 2015-08-27 NOTE — Patient Instructions (Signed)
Please take prednisone as directed.  Continue your diabetes medications as directed Check fasting blood sugar daily -- If blood sugar reaching > 200. Please hold steroid and give [place] a call.  Limit your carbohydrates while on medication.  Follow-up if symptoms are not resolving.

## 2015-08-27 NOTE — Assessment & Plan Note (Signed)
Will begin Prednisone 40 mg x 2 days, then 30 mg x 2 days, then 20 mg x 2 days then 10 mg x 2 days. Continue cortisone cream. Limit carbs. Continue diabetic medications. Monitor fasting CBGs. Glucose parameters given in AVS.

## 2015-08-27 NOTE — Progress Notes (Signed)
Patient presents to clinic today c/o worsening poison sumac rash since ER evaluation on 08/24/15. Is now all over forehead, neck and lower extremities in addition to arms, chest and torso. States she did not get her prednisone filled due to concern over her sugar. Has only been using hydrocortisone twice daily.  Denies SOB or fever.  Past Medical History  Diagnosis Date  . Screening for AAA (abdominal aortic aneurysm) 11/2009    aorta U/S, no AAA  . Hyperlipidemia   . Hypertension   . Osteoarthritis   . GERD (gastroesophageal reflux disease)   . Osteopenia   . Anxiety   . Diabetes 12-2012    Current Outpatient Prescriptions on File Prior to Visit  Medication Sig Dispense Refill  . ALPRAZolam (XANAX) 0.25 MG tablet Take 1 tablet (0.25 mg total) by mouth 3 (three) times daily as needed for sleep. 60 tablet 2  . aspirin 81 MG tablet Take 81 mg by mouth daily.      . Calcium Carbonate-Vitamin D (CALCIUM + D PO) Take by mouth.      . escitalopram (LEXAPRO) 10 MG tablet Take 1 tablet (10 mg total) by mouth daily. 90 tablet 1  . glucosamine-chondroitin 500-400 MG tablet Take 1 tablet by mouth 3 (three) times daily.      Marland Kitchen glucose blood test strip Check blood sugars once daily. 100 each 11  . hydrocortisone cream 1 % Apply to affected area 2 times daily 15 g 0  . Lancets (ONETOUCH ULTRASOFT) lancets Check blood sugars once daily. 100 each 11  . lisinopril-hydrochlorothiazide (PRINZIDE,ZESTORETIC) 10-12.5 MG per tablet Take 1 tablet by mouth daily. 90 tablet 1  . meloxicam (MOBIC) 7.5 MG tablet Take 1 tablet (7.5 mg total) by mouth daily. 90 tablet 0  . metFORMIN (GLUCOPHAGE) 500 MG tablet Take 1 tablet (500 mg total) by mouth 2 (two) times daily with a meal. 180 tablet 1  . Multiple Vitamin (MULTIVITAMIN) capsule Take 1 capsule by mouth daily.      . pantoprazole (PROTONIX) 40 MG tablet Take 1 tablet (40 mg total) by mouth daily. 90 tablet 1  . simvastatin (ZOCOR) 80 MG tablet Take 1 tablet  (80 mg total) by mouth at bedtime. 90 tablet 1   No current facility-administered medications on file prior to visit.    Allergies  Allergen Reactions  . Sulfamethoxazole-Trimethoprim     REACTION: rash all over    Family History  Problem Relation Age of Onset  . Colon cancer Neg Hx   . Breast cancer Other     aunt   . Coronary artery disease Mother     M at age 66 and F  . Diabetes Mother     M, F, daughter, sister  . Hypertension Mother   . Stroke Father   . Asthma Sister     Social History   Social History  . Marital Status: Married    Spouse Name: Coralee Pesa  . Number of Children: 3  . Years of Education: N/A   Occupational History  . retired    .     Social History Main Topics  . Smoking status: Former Smoker    Quit date: 10/15/1981  . Smokeless tobacco: Never Used     Comment: was a social smoker  . Alcohol Use: Yes     Comment: wine sometimes  . Drug Use: No  . Sexual Activity: Not Asked   Other Topics Concern  . None   Social History Narrative  3 daughters live in [place], 5 G-children, 1 GG son, married x 3    Review of Systems - See HPI.  All other ROS are negative.  BP 124/82 mmHg  Pulse 65  Temp(Src) 98.1 F (36.7 C) (Oral)  Resp 16  Ht 5' (1.524 m)  Wt 137 lb 8 oz (62.37 kg)  BMI 26.85 kg/m2  SpO2 97%  Physical Exam  Constitutional: She is oriented to person, place, and time and well-developed, well-nourished, and in no distress.  HENT:  Head: Normocephalic and atraumatic.  Eyes: Conjunctivae are normal.  Cardiovascular: Normal rate, regular rhythm, normal heart sounds and intact distal pulses.   Pulmonary/Chest: Effort normal and breath sounds normal. No respiratory distress. She has no wheezes. She has no rales. She exhibits no tenderness.  Neurological: She is alert and oriented to person, place, and time.  Skin: Skin is warm and dry.  Papulovesicular rash noted on forehead, neck, torso, chest and bilateral upper and lower  extremities.  Psychiatric: Affect normal.  Vitals reviewed.   Recent Results (from the past 2160 hour(s))  HM MAMMOGRAPHY     Status: None   Collection Time: 07/15/15 12:00 AM  Result Value Ref Range   HM Mammogram Bi-Rads:1     Assessment/Plan: Poison sumac Will begin Prednisone 40 mg x 2 days, then 30 mg x 2 days, then 20 mg x 2 days then 10 mg x 2 days. Continue cortisone cream. Limit carbs. Continue diabetic medications. Monitor fasting CBGs. Glucose parameters given in AVS.

## 2015-08-27 NOTE — Telephone Encounter (Signed)
Seen today. 

## 2015-08-27 NOTE — Telephone Encounter (Addendum)
Relation to pt: self Call back number: [phone] Pharmacy:  Reason for call:  Patient states rash and swelling has not improved and was advised by nurse to call office, patient was transferred to [facility] but would like to speak with office regarding concerns. Patient has CPE 08/28/15

## 2015-08-27 NOTE — Telephone Encounter (Signed)
Would you like to do anything additional or have the patient come in?

## 2015-08-27 NOTE — Telephone Encounter (Signed)
Pt called back, and spoke with [facility] (see other phone note). Rash is weeping and has now spread. Please advise.

## 2015-08-27 NOTE — Progress Notes (Signed)
Pre visit review using our clinic review tool, if applicable. No additional management support is needed unless otherwise documented below in the visit note/SLS  

## 2015-08-27 NOTE — Telephone Encounter (Signed)
Patient Name: Monique Bauer DOB: [birth year] Initial Comment Caller states Saturday began itching all over/face/ eyes; poison sumac in the yard; went to ED; gave cortisone cream; called DR Larose Kells yesterday and she adv to call back; spreading, RT is very swollen, weeping; Nurse Assessment Nurse: Marcelline Deist, RN, Kermit Balo Date/Time (Eastern Time): 08/27/2015 11:35:51 AM Confirm and document reason for call. If symptomatic, describe symptoms. ---Caller states Saturday began itching all over/face/eyes from poison sumac in the yard. Went to ED, gave cortisone cream, was ordered Prednisone & didn't fill it because she is diabetic & on Meloxicam. Called Dr. Larose Kells yesterday and she was advised to call back; spreading. Her right arm is very swollen & weeping, has spread on chest, to left arm, on face. Has the patient traveled out of the country within the last 30 days? ---Not Applicable Does the patient require triage? ---Yes Related visit to physician within the last 2 weeks? ---Yes Does the PT have any chronic conditions? (i.e. diabetes, asthma, etc.) ---Yes List chronic conditions. ---diabetic, on Metformin, acid reflux & arthritis Guidelines Guideline Title Affirmed Question Affirmed Notes Poison Ivy - [employer] [1] Severe poison ivy, oak, or sumac reaction in the past AND [2] face involved Final Disposition User See Physician within 4 Hours (or PCP triage) Marcelline Deist, RN, Kermit Balo Comments Please contact patient if advice/feedback by phone can be done, so that her appt. may be cancelled. She was scheduled to come in this afternoon by nurse per outcome. Patient informed nurse that she has been taking Benadryl every 4 hours. Referrals REFERRED TO PCP OFFICE Disagree/Comply: Comply

## 2015-08-28 ENCOUNTER — Encounter: Payer: Self-pay | Admitting: Internal Medicine

## 2015-08-28 ENCOUNTER — Ambulatory Visit (INDEPENDENT_AMBULATORY_CARE_PROVIDER_SITE_OTHER): Payer: Medicare Other | Admitting: Internal Medicine

## 2015-08-28 VITALS — BP 124/70 | HR 62 | Temp 97.7°F | Ht 60.0 in | Wt 137.5 lb

## 2015-08-28 DIAGNOSIS — Z0000 Encounter for general adult medical examination without abnormal findings: Secondary | ICD-10-CM

## 2015-08-28 DIAGNOSIS — I10 Essential (primary) hypertension: Secondary | ICD-10-CM

## 2015-08-28 DIAGNOSIS — E119 Type 2 diabetes mellitus without complications: Secondary | ICD-10-CM | POA: Diagnosis not present

## 2015-08-28 DIAGNOSIS — Z09 Encounter for follow-up examination after completed treatment for conditions other than malignant neoplasm: Secondary | ICD-10-CM | POA: Insufficient documentation

## 2015-08-28 LAB — BASIC METABOLIC PANEL
BUN: 21 mg/dL (ref 6–23)
CHLORIDE: 102 meq/L (ref 96–112)
CO2: 28 mEq/L (ref 19–32)
Calcium: 9.6 mg/dL (ref 8.4–10.5)
Creatinine, Ser: 0.96 mg/dL (ref 0.40–1.20)
GFR: 59.92 mL/min — ABNORMAL LOW (ref >60.00)
Glucose, Bld: 101 mg/dL — ABNORMAL HIGH (ref 70–99)
POTASSIUM: 3.5 meq/L (ref 3.5–5.1)
Sodium: 140 mEq/L (ref 135–145)

## 2015-08-28 LAB — HEMOGLOBIN A1C: HEMOGLOBIN A1C: 6.2 % (ref 4.6–6.5)

## 2015-08-28 NOTE — Assessment & Plan Note (Signed)
Td 10-2009 ; Pneumonia 2015; had shingles shot ; flu shot-- RTC once off a prednisone taper  had a flu shot already   female care  All previous PAPs wnl, further screening no longer indicated per guidelines but pt preference is to be seen by gyn at least one more time, will call for a referral if needed  MMG --07/2015  (-)  CCS: Cscope 2003 and 03-2006, next 2017  diet and exercise discussed

## 2015-08-28 NOTE — Progress Notes (Signed)
Pre visit review using our clinic review tool, if applicable. No additional management support is needed unless otherwise documented below in the visit note. 

## 2015-08-28 NOTE — Patient Instructions (Signed)
Get your blood work before you leave   next visit in 6 months for a routine checkup, fasting.  Fall Prevention and Home Safety Falls cause injuries and can affect all age groups. It is possible to use preventive measures to significantly decrease the likelihood of falls. There are many simple measures which can make your home safer and prevent falls. OUTDOORS  Repair cracks and edges of walkways and driveways.  Remove high doorway thresholds.  Trim shrubbery on the main path into your home.  Have good outside lighting.  Clear walkways of tools, rocks, debris, and clutter.  Check that handrails are not broken and are securely fastened. Both sides of steps should have handrails.  Have leaves, snow, and ice cleared regularly.  Use sand or salt on walkways during winter months.  In the garage, clean up grease or oil spills. BATHROOM  Install night lights.  Install grab bars by the toilet and in the tub and shower.  Use non-skid mats or decals in the tub or shower.  Place a plastic non-slip stool in the shower to sit on, if needed.  Keep floors dry and clean up all water on the floor immediately.  Remove soap buildup in the tub or shower on a regular basis.  Secure bath mats with non-slip, double-sided rug tape.  Remove throw rugs and tripping hazards from the floors. BEDROOMS  Install night lights.  Make sure a bedside light is easy to reach.  Do not use oversized bedding.  Keep a telephone by your bedside.  Have a firm chair with side arms to use for getting dressed.  Remove throw rugs and tripping hazards from the floor. KITCHEN  Keep handles on pots and pans turned toward the center of the stove. Use back burners when possible.  Clean up spills quickly and allow time for drying.  Avoid walking on wet floors.  Avoid hot utensils and knives.  Position shelves so they are not too high or low.  Place commonly used objects within easy reach.  If  necessary, use a sturdy step stool with a grab bar when reaching.  Keep electrical cables out of the way.  Do not use floor polish or wax that makes floors slippery. If you must use wax, use non-skid floor wax.  Remove throw rugs and tripping hazards from the floor. STAIRWAYS  Never leave objects on stairs.  Place handrails on both sides of stairways and use them. Fix any loose handrails. Make sure handrails on both sides of the stairways are as long as the stairs.  Check carpeting to make sure it is firmly attached along stairs. Make repairs to worn or loose carpet promptly.  Avoid placing throw rugs at the top or bottom of stairways, or properly secure the rug with carpet tape to prevent slippage. Get rid of throw rugs, if possible.  Have an electrician put in a light switch at the top and bottom of the stairs. OTHER FALL PREVENTION TIPS  Wear low-heel or rubber-soled shoes that are supportive and fit well. Wear closed toe shoes.  When using a stepladder, make sure it is fully opened and both spreaders are firmly locked. Do not climb a closed stepladder.  Add color or contrast paint or tape to grab bars and handrails in your home. Place contrasting color strips on first and last steps.  Learn and use mobility aids as needed. Install an electrical emergency response system.  Turn on lights to avoid dark areas. Replace light bulbs that burn  out immediately. Get light switches that glow.  Arrange furniture to create clear pathways. Keep furniture in the same place.  Firmly attach carpet with non-skid or double-sided tape.  Eliminate uneven floor surfaces.  Select a carpet pattern that does not visually hide the edge of steps.  Be aware of all pets. OTHER HOME SAFETY TIPS  Set the water temperature for 120 F (48.8 C).  Keep emergency numbers on or near the telephone.  Keep smoke detectors on every level of the home and near sleeping areas. Document Released: 11/20/2002  Document Revised: 05/31/2012 Document Reviewed: 02/19/2012 [facility] Patient Information 2015 [place], [place]. This information is not intended to replace advice given to you by your health care provider. Make sure you discuss any questions you have with your health care provider.   Preventive Care for Adults Ages 46 and over  Blood pressure check.** / Every 1 to 2 years.  Lipid and cholesterol check.**/ Every 5 years beginning at age 70.  Lung cancer screening. / Every year if you are aged 3-80 years and have a 30-pack-year history of smoking and currently smoke or have quit within the past 15 years. Yearly screening is stopped once you have quit smoking for at least 15 years or develop a health problem that would prevent you from having lung cancer treatment.  Fecal occult blood test (FOBT) of stool. / Every year beginning at age 65 and continuing until age 67. You may not have to do this test if you get a colonoscopy every 10 years.  Flexible sigmoidoscopy** or colonoscopy.** / Every 5 years for a flexible sigmoidoscopy or every 10 years for a colonoscopy beginning at age 48 and continuing until age 13.  Hepatitis C blood test.** / For all people born from [birth year] through 1963 and any individual with known risks for hepatitis C.  Abdominal aortic aneurysm (AAA) screening.** / A one-time screening for ages 42 to 6 years who are current or former smokers.  Skin self-exam. / Monthly.  Influenza vaccine. / Every year.  Tetanus, diphtheria, and acellular pertussis (Tdap/Td) vaccine.** / 1 dose of Td every 10 years.  Varicella vaccine.** / Consult your health care provider.  Zoster vaccine.** / 1 dose for adults aged 46 years or older.  Pneumococcal 13-valent conjugate (PCV13) vaccine.** / Consult your health care provider.  Pneumococcal polysaccharide (PPSV23) vaccine.** / 1 dose for all adults aged 16 years and older.  Meningococcal vaccine.** / Consult your health care  provider.  Hepatitis A vaccine.** / Consult your health care provider.  Hepatitis B vaccine.** / Consult your health care provider.  Haemophilus influenzae type b (Hib) vaccine.** / Consult your health care provider. **Family history and personal history of risk and conditions may change your health care provider's recommendations. Document Released: 01/26/2002 Document Revised: 12/05/2013 Document Reviewed: 04/27/2011 [facility] Patient Information 2015 [place], [place]. This information is not intended to replace advice given to you by your health care provider. Make sure you discuss any questions you have with your health care provider.

## 2015-08-28 NOTE — Assessment & Plan Note (Signed)
Diabetes: On metformin, check A1c.  HTN: controlled, cont lisinopril-hct, check a BMP High cholesterol: On simvastatin, last FLP satisfactory. LFTs normal. Anxiety, insomnia: On Lexapro and Xanax. Symptoms controlled. Check a UDS GERD: On PPIs, asymptomatic Next visit 6 months

## 2015-08-28 NOTE — Progress Notes (Signed)
Subjective:    Patient ID: Monique Bauer, female    DOB: 1936/05/26, 79 y.o.   MRN: [id number]  DOS:  08/28/2015 Type of visit - description :  Here for Medicare AWV:  1. Risk factors based on Past M, S, F history: reviewed 2. Physical Activities:  Exercises at home qd, treadmill 3. Depression/mood: neg screening  4. Hearing:  No problems noted or reported  5. ADL's: independent, drives  6. Fall Risk: no recent falls, prevention discussed , see AVS 7. home Safety: does feel safe at home  8. Height, weight, & visual acuity: see VS, sees eye doctor regulalrly, has cataracts but no surgery  9. Counseling: provided 10. Labs ordered based on risk factors: if needed  11. Referral Coordination: if needed 12. Care Plan, see assessment and plan , written personalized plan provided , see AVS 13. Cognitive Assessment: motor skills and cognition appropriate for age 40. Care team updated 15. End-of-life care discussed, has a HC-POA  In addition, today we discussed the following:  diabetes: Good compliance with medication, ambulatory CBGs usually in the low 100.  anxiety well controlled on medications  hypertension: good BP is here at the office, good compliance with medications. Contact dermatitis, on steroid's, improving. High cholesterol, good compliance with statins   Review of Systems  Constitutional: No fever. No chills. No unexplained wt changes. No unusual sweats  HEENT: No dental problems, no ear discharge, no facial swelling, no voice changes. No eye discharge, no eye  redness , no  intolerance to light   Respiratory: No wheezing , no  difficulty breathing. No cough , no mucus production  Cardiovascular: No CP, no leg swelling , no  Palpitations  GI: no nausea, no vomiting, no diarrhea , no  abdominal pain.  No blood in the stools. No dysphagia, no odynophagia    Endocrine: No polyphagia, no polyuria , no polydipsia  GU: No dysuria, gross hematuria, difficulty  urinating. No urinary urgency, no frequency.  Musculoskeletal: No joint swellings or unusual aches or pains  Skin: No change in the color of the skin, palor   Allergic, immunologic:currently on a steroid contact dermatitis, improving.  Neurological: No dizziness no  syncope. No headaches. No diplopia, no slurred, no slurred speech, no motor deficits, no facial  Numbness  Hematological: No enlarged lymph nodes, no easy bruising , no unusual bleedings  Psychiatry: No suicidal ideas, no hallucinations, no beavior problems, no confusion.  No unusual/severe anxiety, no depression  Past Medical History  Diagnosis Date  . Screening for AAA (abdominal aortic aneurysm) 11/2009    aorta U/S, no AAA  . Hyperlipidemia   . Hypertension   . Osteoarthritis   . GERD (gastroesophageal reflux disease)   . Osteopenia   . Anxiety   . Diabetes 12-2012    Past Surgical History  Procedure Laterality Date  . No past surgeries      Social History   Social History  . Marital Status: Married    Spouse Name: Coralee Pesa  . Number of Children: 3  . Years of Education: N/A   Occupational History  . retired    .     Social History Main Topics  . Smoking status: Former Smoker    Quit date: 10/15/1981  . Smokeless tobacco: Never Used     Comment: was a social smoker  . Alcohol Use: Yes     Comment: wine sometimes  . Drug Use: No  . Sexual Activity: Not on file  Other Topics Concern  . Not on file   Social History Narrative    3 daughters live in [place], 5 G-children, 1 GG son, married x 3         Medication List       This list is accurate as of: 08/28/15  6:13 PM.  Always use your most recent med list.               ALPRAZolam 0.25 MG tablet  Commonly known as:  XANAX  Take 1 tablet (0.25 mg total) by mouth 3 (three) times daily as needed for sleep.     aspirin 81 MG tablet  Take 81 mg by mouth daily.     CALCIUM + D PO  Take by mouth.     escitalopram 10 MG tablet  Commonly  known as:  LEXAPRO  Take 1 tablet (10 mg total) by mouth daily.     glucosamine-chondroitin 500-400 MG tablet  Take 1 tablet by mouth 3 (three) times daily.     glucose blood test strip  Check blood sugars once daily.     hydrocortisone cream 1 %  Apply to affected area 2 times daily     lisinopril-hydrochlorothiazide 10-12.5 MG per tablet  Commonly known as:  PRINZIDE,ZESTORETIC  Take 1 tablet by mouth daily.     meloxicam 7.5 MG tablet  Commonly known as:  MOBIC  Take 1 tablet (7.5 mg total) by mouth daily.     metFORMIN 500 MG tablet  Commonly known as:  GLUCOPHAGE  Take 1 tablet (500 mg total) by mouth 2 (two) times daily with a meal.     multivitamin capsule  Take 1 capsule by mouth daily.     onetouch ultrasoft lancets  Check blood sugars once daily.     pantoprazole 40 MG tablet  Commonly known as:  PROTONIX  Take 1 tablet (40 mg total) by mouth daily.     predniSONE 10 MG tablet  Commonly known as:  DELTASONE  Take 4 tablets by mouth x 2 days, then 3 tablets by mouth x 2 days, then 2 tablets x 2 days, then 1 tablet x 2 day.     simvastatin 80 MG tablet  Commonly known as:  ZOCOR  Take 1 tablet (80 mg total) by mouth at bedtime.           Objective:   Physical Exam BP 124/70 mmHg  Pulse 62  Temp(Src) 97.7 F (36.5 C) (Oral)  Ht 5' (1.524 m)  Wt 137 lb 8 oz (62.37 kg)  BMI 26.85 kg/m2  SpO2 92% General:   Well developed, well nourished . NAD.  HEENT:  Normocephalic . Face symmetric, atraumatic Lungs:  CTA B Normal respiratory effort, no intercostal retractions, no accessory muscle use. Heart: RRR,  no murmur.  no pretibial edema bilaterally  Abdomen:  Not distended, soft, non-tender. No rebound or rigidity. No mass,organomegaly Skin: rash consistent with contact dermatitis, less red than yesterday Neurologic:  alert & oriented X3.  Speech normal, gait appropriate for age and unassisted Psych--  Cognition and judgment appear intact.    Cooperative with normal attention span and concentration.  Behavior appropriate. No anxious or depressed appearing.    Assessment & Plan:   A/P Diabetes: On metformin, check A1c.  HTN: controlled, cont lisinopril-hct, check a BMP High cholesterol: On simvastatin, last FLP satisfactory. LFTs normal. Anxiety, insomnia: On Lexapro and Xanax. Symptoms controlled. Check a UDS GERD: On PPIs, asymptomatic Next visit 6 months

## 2015-09-11 ENCOUNTER — Ambulatory Visit (INDEPENDENT_AMBULATORY_CARE_PROVIDER_SITE_OTHER): Payer: Medicare Other

## 2015-09-11 DIAGNOSIS — Z23 Encounter for immunization: Secondary | ICD-10-CM | POA: Diagnosis not present

## 2015-10-21 ENCOUNTER — Other Ambulatory Visit: Payer: Self-pay | Admitting: Internal Medicine

## 2015-12-23 ENCOUNTER — Other Ambulatory Visit: Payer: Self-pay | Admitting: Internal Medicine

## 2015-12-24 NOTE — Telephone Encounter (Signed)
Pt is requesting refill on Meloxicam.  Last OV: 08/28/2015, F/U on 02/26/2016 Last Fill: 06/21/2015 #90 and 0RF Pt sig: 1 tablet daily   Okay to refill?

## 2015-12-24 NOTE — Telephone Encounter (Signed)
Rx sent 

## 2015-12-24 NOTE — Telephone Encounter (Signed)
Okay #90, no refills 

## 2016-02-26 ENCOUNTER — Ambulatory Visit: Payer: Medicare Other | Admitting: Internal Medicine

## 2016-03-05 MED FILL — FLUOROMETHOLONE 0.1% DROPS: 0.1 | 42 days supply | Qty: 15 | Fill #0

## 2016-03-31 ENCOUNTER — Encounter: Payer: Self-pay | Admitting: Internal Medicine

## 2016-03-31 ENCOUNTER — Ambulatory Visit (INDEPENDENT_AMBULATORY_CARE_PROVIDER_SITE_OTHER): Payer: Medicare Other | Admitting: Internal Medicine

## 2016-03-31 VITALS — BP 126/72 | HR 61 | Temp 97.7°F | Resp 16 | Ht 60.0 in | Wt 138.2 lb

## 2016-03-31 DIAGNOSIS — E119 Type 2 diabetes mellitus without complications: Secondary | ICD-10-CM

## 2016-03-31 DIAGNOSIS — I10 Essential (primary) hypertension: Secondary | ICD-10-CM | POA: Diagnosis not present

## 2016-03-31 DIAGNOSIS — E785 Hyperlipidemia, unspecified: Secondary | ICD-10-CM

## 2016-03-31 DIAGNOSIS — Z09 Encounter for follow-up examination after completed treatment for conditions other than malignant neoplasm: Secondary | ICD-10-CM

## 2016-03-31 LAB — BASIC METABOLIC PANEL
BUN: 22 mg/dL (ref 6–23)
CHLORIDE: 102 meq/L (ref 96–112)
CO2: 33 mEq/L — ABNORMAL HIGH (ref 19–32)
Calcium: 9.7 mg/dL (ref 8.4–10.5)
Creatinine, Ser: 1.03 mg/dL (ref 0.40–1.20)
GFR: 55.16 mL/min — AB (ref >60.00)
Glucose, Bld: 111 mg/dL — ABNORMAL HIGH (ref 70–99)
POTASSIUM: 4.3 meq/L (ref 3.5–5.1)
SODIUM: 140 meq/L (ref 135–145)

## 2016-03-31 LAB — CBC WITH DIFFERENTIAL/PLATELET
BASOS ABS: 0 10*3/uL (ref 0.0–0.1)
Basophils Relative: 0.4 % (ref 0.0–3.0)
EOS ABS: 0.3 10*3/uL (ref 0.0–0.7)
Eosinophils Relative: 4.4 % (ref 0.0–5.0)
HEMATOCRIT: 41.7 % (ref 36.0–46.0)
HEMOGLOBIN: 14 g/dL (ref 12.0–15.0)
LYMPHS PCT: 21.8 % (ref 12.0–46.0)
Lymphs Abs: 1.4 10*3/uL (ref 0.7–4.0)
MCHC: 33.5 g/dL (ref 30.0–36.0)
MCV: 86.3 fl (ref 78.0–100.0)
MONO ABS: 0.8 10*3/uL (ref 0.1–1.0)
Monocytes Relative: 12.2 % — ABNORMAL HIGH (ref 3.0–12.0)
Neutro Abs: 3.8 10*3/uL (ref 1.4–7.7)
Neutrophils Relative %: 61.2 % (ref 43.0–77.0)
Platelets: 245 10*3/uL (ref 150.0–400.0)
RBC: 4.83 Mil/uL (ref 3.87–5.11)
RDW: 14.1 % (ref 11.5–15.5)
WBC: 6.3 10*3/uL (ref 4.0–10.5)

## 2016-03-31 LAB — ALT: ALT: 15 U/L (ref 0–35)

## 2016-03-31 LAB — HEMOGLOBIN A1C: HEMOGLOBIN A1C: 6.5 % (ref 4.6–6.5)

## 2016-03-31 LAB — AST: AST: 17 U/L (ref 0–37)

## 2016-03-31 NOTE — Progress Notes (Signed)
Subjective:    Patient ID: Monique Bauer, female    DOB: Nov 10, 1936, 80 y.o.   MRN: [id number]  DOS:  03/31/2016 Type of visit - description : Routine office visit Interval history: Diabetes: Good compliance of medication, no apparent side effects, blood sugars 110, 120 when checked. Occasionally 97 DJD: On meloxicam daily, no abdominal pain or nausea. Takes with food Hypertension: Good compliance of medication, no recent ambulatory BPs. BP today is very good Hyperlipidemia: On statins, good compliance. Denies any side effects   Review of Systems   Past Medical History  Diagnosis Date  . Screening for AAA (abdominal aortic aneurysm) 11/2009    aorta U/S, no AAA  . Hyperlipidemia   . Hypertension   . Osteoarthritis   . GERD (gastroesophageal reflux disease)   . Osteopenia   . Anxiety   . Diabetes ([place]) 12-2012    Past Surgical History  Procedure Laterality Date  . No past surgeries      Social History   Social History  . Marital Status: Married    Spouse Name: Coralee Pesa  . Number of Children: 3  . Years of Education: N/A   Occupational History  . retired    .     Social History Main Topics  . Smoking status: Former Smoker    Quit date: 10/15/1981  . Smokeless tobacco: Never Used     Comment: was a social smoker  . Alcohol Use: Yes     Comment: wine sometimes  . Drug Use: No  . Sexual Activity: Not on file   Other Topics Concern  . Not on file   Social History Narrative    3 daughters live in [place], 5 G-children, 1 GG son, married x 3         Medication List       This list is accurate as of: 03/31/16  5:10 PM.  Always use your most recent med list.               ALPRAZolam 0.25 MG tablet  Commonly known as:  XANAX  Take 1 tablet (0.25 mg total) by mouth 3 (three) times daily as needed for sleep.     aspirin 81 MG tablet  Take 81 mg by mouth daily.     CALCIUM + D PO  Take by mouth.     escitalopram 10 MG tablet  Commonly known as:   LEXAPRO  Take 1 tablet (10 mg total) by mouth daily.     glucosamine-chondroitin 500-400 MG tablet  Take 1 tablet by mouth 3 (three) times daily.     glucose blood test strip  Check blood sugars once daily.     hydrocortisone cream 1 %  Apply to affected area 2 times daily     lisinopril-hydrochlorothiazide 10-12.5 MG tablet  Commonly known as:  PRINZIDE,ZESTORETIC  Take 1 tablet by mouth daily.     meloxicam 7.5 MG tablet  Commonly known as:  MOBIC  Take 1 tablet (7.5 mg total) by mouth daily.     metFORMIN 500 MG tablet  Commonly known as:  GLUCOPHAGE  Take 1 tablet (500 mg total) by mouth 2 (two) times daily with a meal.     multivitamin capsule  Take 1 capsule by mouth daily.     onetouch ultrasoft lancets  Check blood sugars once daily.     pantoprazole 40 MG tablet  Commonly known as:  PROTONIX  Take 1 tablet (40 mg total) by mouth daily.  simvastatin 80 MG tablet  Commonly known as:  ZOCOR  Take 1 tablet (80 mg total) by mouth at bedtime.           Objective:   Physical Exam BP 126/72 mmHg  Pulse 61  Temp(Src) 97.7 F (36.5 C) (Oral)  Resp 16  Ht 5' (1.524 m)  Wt 138 lb 4 oz (62.71 kg)  BMI 27.00 kg/m2  SpO2 97%  General:   Well developed, well nourished . NAD.  HEENT:  Normocephalic . Face symmetric, atraumatic Lungs:  CTA B Normal respiratory effort, no intercostal retractions, no accessory muscle use. Heart: RRR,  no murmur.  no pretibial edema bilaterally  Abdomen:  Not distended, soft, non-tender. No rebound or rigidity.  Skin: Not pale. Not jaundice DIABETIC FEET EXAM: No lower extremity edema Normal pedal pulses bilaterally Skin normal, nails normal, no calluses Pinprick examination of the feet normal.  Neurologic:  alert & oriented X3.  Speech normal, gait appropriate for age and unassisted Psych--  Cognition and judgment appear intact.  Cooperative with normal attention span and concentration.  Behavior appropriate. No  anxious or depressed appearing.     Assessment & Plan:  Assessment Diabetes 2014 HTN Hyperlipidemia Anxiety DJD GERD Osteopenia Ao [place] (-)  AAA 2010  PLAN: DM: Continue metformin, feet exam negative today, eye exam negative last month, check A1c HTN: Continue Zestoretic, check a BMP, CBC Hyperlipidemia: Continue simvastatin, last FLP satisfactory, check LFTs Anxiety depression: Related to family issues, currently well controlled. Will try to obtain a UDS today although the patient may not have a sample to provide RTC 08-2016 CPX

## 2016-03-31 NOTE — Assessment & Plan Note (Signed)
DM: Continue metformin, feet exam negative today, eye exam negative last month, check A1c HTN: Continue Zestoretic, check a BMP, CBC Hyperlipidemia: Continue simvastatin, last FLP satisfactory, check LFTs Anxiety depression: Related to family issues, currently well controlled. Will try to obtain a UDS today although the patient may not have a sample to provide RTC 08-2016 CPX

## 2016-03-31 NOTE — Patient Instructions (Signed)
GO TO THE LAB :      Get the blood work     GO TO THE FRONT DESK  Schedule your next appointment for a physical  exam by September 2017, fasting

## 2016-03-31 NOTE — Progress Notes (Signed)
Pre visit review using our clinic review tool, if applicable. No additional management support is needed unless otherwise documented below in the visit note/SLS  

## 2016-04-24 ENCOUNTER — Other Ambulatory Visit: Payer: Self-pay | Admitting: Internal Medicine

## 2016-04-24 NOTE — Telephone Encounter (Signed)
Pt is requesting refill on Meloxicam.  Last OV: 03/31/2016 Last Fill: 12/24/2015 #90 and 0RF  Okay to refill?

## 2016-04-24 NOTE — Telephone Encounter (Signed)
Rx sent 

## 2016-04-24 NOTE — Telephone Encounter (Signed)
Okay #90, no refills 

## 2016-06-11 ENCOUNTER — Encounter: Payer: Self-pay | Admitting: Internal Medicine

## 2016-07-17 LAB — HM MAMMOGRAPHY

## 2016-07-17 LAB — HM DEXA SCAN

## 2016-07-21 ENCOUNTER — Encounter: Payer: Self-pay | Admitting: Internal Medicine

## 2016-07-21 ENCOUNTER — Other Ambulatory Visit: Payer: Self-pay | Admitting: Internal Medicine

## 2016-07-21 NOTE — Telephone Encounter (Signed)
Pt is requesting refill on Meloxicam.  Last OV: 03/31/2016  Last Fill: 04/24/2016 #90 and 0RF  Please advise.

## 2016-07-21 NOTE — Telephone Encounter (Signed)
Rx sent 

## 2016-07-21 NOTE — Telephone Encounter (Signed)
Ok 90 and 1 RF 

## 2016-08-03 ENCOUNTER — Encounter: Payer: Self-pay | Admitting: Internal Medicine

## 2016-08-26 ENCOUNTER — Encounter: Payer: Self-pay | Admitting: Internal Medicine

## 2016-08-27 MED ORDER — METFORMIN HCL 500 MG PO TABS: 500.0000 mg | ORAL_TABLET | Freq: Two times a day (BID) | ORAL | 0 refills | Status: DC

## 2016-08-27 MED FILL — metFORMIN HCL 500 MG TABS: 500 | 30 days supply | Qty: 60 | Fill #0

## 2016-08-28 ENCOUNTER — Other Ambulatory Visit: Payer: Self-pay | Admitting: Internal Medicine

## 2016-09-03 ENCOUNTER — Encounter: Payer: Self-pay | Admitting: Internal Medicine

## 2016-09-03 ENCOUNTER — Ambulatory Visit (INDEPENDENT_AMBULATORY_CARE_PROVIDER_SITE_OTHER): Payer: Medicare Other | Admitting: Internal Medicine

## 2016-09-03 VITALS — BP 124/80 | HR 68 | Temp 97.9°F | Resp 14 | Ht 60.0 in | Wt 138.5 lb

## 2016-09-03 DIAGNOSIS — E119 Type 2 diabetes mellitus without complications: Secondary | ICD-10-CM

## 2016-09-03 DIAGNOSIS — Z0000 Encounter for general adult medical examination without abnormal findings: Secondary | ICD-10-CM

## 2016-09-03 DIAGNOSIS — Z23 Encounter for immunization: Secondary | ICD-10-CM

## 2016-09-03 LAB — BASIC METABOLIC PANEL
BUN: 19 mg/dL (ref 6–23)
CO2: 31 meq/L (ref 19–32)
Calcium: 9.1 mg/dL (ref 8.4–10.5)
Chloride: 104 mEq/L (ref 96–112)
Creatinine, Ser: 0.99 mg/dL (ref 0.40–1.20)
GFR: 57.67 mL/min — ABNORMAL LOW (ref >60.00)
GLUCOSE: 108 mg/dL — AB (ref 70–99)
POTASSIUM: 3.8 meq/L (ref 3.5–5.1)
SODIUM: 141 meq/L (ref 135–145)

## 2016-09-03 LAB — LIPID PANEL
CHOL/HDL RATIO: 3
Cholesterol: 141 mg/dL (ref 0–200)
HDL: 51.9 mg/dL (ref >39.00)
LDL Cholesterol: 61 mg/dL (ref 0–99)
NONHDL: 89.24
TRIGLYCERIDES: 141 mg/dL (ref 0.0–149.0)
VLDL: 28.2 mg/dL (ref 0.0–40.0)

## 2016-09-03 LAB — HEMOGLOBIN A1C: Hgb A1c MFr Bld: 6.3 % (ref 4.6–6.5)

## 2016-09-03 NOTE — Assessment & Plan Note (Signed)
DM, HTN, hyperlipidemia, anxiety, GERD: Chronic medical problems seem stable, hardly ever takes Xanax, good compliance with all medications, ambulatory BPs, CBGs within normal. Check labs. Try to decrease PPIs to every other day. Continue Claritin for allergies RTC 6 months.

## 2016-09-03 NOTE — Assessment & Plan Note (Addendum)
Td 10-2009 ; Pneumonia 2015; prevnar 2016; had shingles shot  Flu shot-- today female care  All previous PAPs wnl, further screening no longer indicated per guidelines   MMG --07/2016, had additional XRs-->  (-)  CCS: Cscope 2003, cscope 03-2006 wnl, no FH, pt elected no further screening , ok per guidelines   Counseled: Diet, exercise, recommend healthcare power of attorney, fall prevention

## 2016-09-03 NOTE — Patient Instructions (Signed)
GO TO THE LAB : Get the blood work    GO TO THE FRONT DESK Schedule your next appointment for a  Routine check in 6 months   Consider getting her healthcare power of attorney      Fall Prevention and Home Safety Falls cause injuries and can affect all age groups. It is possible to use preventive measures to significantly decrease the likelihood of falls. There are many simple measures which can make your home safer and prevent falls. OUTDOORS  Repair cracks and edges of walkways and driveways.  Remove high doorway thresholds.  Trim shrubbery on the main path into your home.  Have good outside lighting.  Clear walkways of tools, rocks, debris, and clutter.  Check that handrails are not broken and are securely fastened. Both sides of steps should have handrails.  Have leaves, snow, and ice cleared regularly.  Use sand or salt on walkways during winter months.  In the garage, clean up grease or oil spills. BATHROOM  Install night lights.  Install grab bars by the toilet and in the tub and shower.  Use non-skid mats or decals in the tub or shower.  Place a plastic non-slip stool in the shower to sit on, if needed.  Keep floors dry and clean up all water on the floor immediately.  Remove soap buildup in the tub or shower on a regular basis.  Secure bath mats with non-slip, double-sided rug tape.  Remove throw rugs and tripping hazards from the floors. BEDROOMS  Install night lights.  Make sure a bedside light is easy to reach.  Do not use oversized bedding.  Keep a telephone by your bedside.  Have a firm chair with side arms to use for getting dressed.  Remove throw rugs and tripping hazards from the floor. KITCHEN  Keep handles on pots and pans turned toward the center of the stove. Use back burners when possible.  Clean up spills quickly and allow time for drying.  Avoid walking on wet floors.  Avoid hot utensils and knives.  Position shelves so  they are not too high or low.  Place commonly used objects within easy reach.  If necessary, use a sturdy step stool with a grab bar when reaching.  Keep electrical cables out of the way.  Do not use floor polish or wax that makes floors slippery. If you must use wax, use non-skid floor wax.  Remove throw rugs and tripping hazards from the floor. STAIRWAYS  Never leave objects on stairs.  Place handrails on both sides of stairways and use them. Fix any loose handrails. Make sure handrails on both sides of the stairways are as long as the stairs.  Check carpeting to make sure it is firmly attached along stairs. Make repairs to worn or loose carpet promptly.  Avoid placing throw rugs at the top or bottom of stairways, or properly secure the rug with carpet tape to prevent slippage. Get rid of throw rugs, if possible.  Have an electrician put in a light switch at the top and bottom of the stairs. OTHER FALL PREVENTION TIPS  Wear low-heel or rubber-soled shoes that are supportive and fit well. Wear closed toe shoes.  When using a stepladder, make sure it is fully opened and both spreaders are firmly locked. Do not climb a closed stepladder.  Add color or contrast paint or tape to grab bars and handrails in your home. Place contrasting color strips on first and last steps.  Learn and use  mobility aids as needed. Install an electrical emergency response system.  Turn on lights to avoid dark areas. Replace light bulbs that burn out immediately. Get light switches that glow.  Arrange furniture to create clear pathways. Keep furniture in the same place.  Firmly attach carpet with non-skid or double-sided tape.  Eliminate uneven floor surfaces.  Select a carpet pattern that does not visually hide the edge of steps.  Be aware of all pets. OTHER HOME SAFETY TIPS  Set the water temperature for 120 F (48.8 C).  Keep emergency numbers on or near the telephone.  Keep smoke  detectors on every level of the home and near sleeping areas. Document Released: 11/20/2002 Document Revised: 05/31/2012 Document Reviewed: 02/19/2012 [facility] Patient Information 2015 [place], [place]. This information is not intended to replace advice given to you by your health care provider. Make sure you discuss any questions you have with your health care provider.   Preventive Care for Adults Ages 4 and over  Blood pressure check.** / Every 1 to 2 years.  Lipid and cholesterol check.**/ Every 5 years beginning at age 46.  Lung cancer screening. / Every year if you are aged 1-80 years and have a 30-pack-year history of smoking and currently smoke or have quit within the past 15 years. Yearly screening is stopped once you have quit smoking for at least 15 years or develop a health problem that would prevent you from having lung cancer treatment.  Fecal occult blood test (FOBT) of stool. / Every year beginning at age 14 and continuing until age 77. You may not have to do this test if you get a colonoscopy every 10 years.  Flexible sigmoidoscopy** or colonoscopy.** / Every 5 years for a flexible sigmoidoscopy or every 10 years for a colonoscopy beginning at age 59 and continuing until age 79.  Hepatitis C blood test.** / For all people born from [birth year] through 1963 and any individual with known risks for hepatitis C.  Abdominal aortic aneurysm (AAA) screening.** / A one-time screening for ages 51 to 31 years who are current or former smokers.  Skin self-exam. / Monthly.  Influenza vaccine. / Every year.  Tetanus, diphtheria, and acellular pertussis (Tdap/Td) vaccine.** / 1 dose of Td every 10 years.  Varicella vaccine.** / Consult your health care provider.  Zoster vaccine.** / 1 dose for adults aged 45 years or older.  Pneumococcal 13-valent conjugate (PCV13) vaccine.** / Consult your health care provider.  Pneumococcal polysaccharide (PPSV23) vaccine.** / 1 dose for all adults  aged 47 years and older.  Meningococcal vaccine.** / Consult your health care provider.  Hepatitis A vaccine.** / Consult your health care provider.  Hepatitis B vaccine.** / Consult your health care provider.  Haemophilus influenzae type b (Hib) vaccine.** / Consult your health care provider. **Family history and personal history of risk and conditions may change your health care provider's recommendations. Document Released: 01/26/2002 Document Revised: 12/05/2013 Document Reviewed: 04/27/2011 [facility] Patient Information 2015 [place], [place]. This information is not intended to replace advice given to you by your health care provider. Make sure you discuss any questions you have with your health care provider.

## 2016-09-03 NOTE — Progress Notes (Signed)
Pre visit review using our clinic review tool, if applicable. No additional management support is needed unless otherwise documented below in the visit note. 

## 2016-09-03 NOTE — Progress Notes (Signed)
Subjective:    Patient ID: Monique Bauer, female    DOB: [birth year], 80 y.o.   MRN: [id number]  DOS:  09/03/2016 Type of visit - description :  CPX Interval history: No major concerns, good medication compliance, ambulatory BPs and CBGs within normal   Review of Systems Constitutional: No fever. No chills. No unexplained wt changes. No unusual sweats  HEENT: No dental problems, no ear discharge, no facial swelling, no voice changes. No eye discharge, no eye  redness , no  intolerance to light   Respiratory: No wheezing , no  difficulty breathing. No cough , no mucus production  Cardiovascular: No CP, no leg swelling , no  Palpitations  GI: no nausea, no vomiting, no diarrhea , no  abdominal pain.  No blood in the stools. No dysphagia, no odynophagia    Endocrine: No polyphagia, no polyuria , no polydipsia  GU: No dysuria, gross hematuria, difficulty urinating. No urinary urgency, no frequency.  Musculoskeletal: No joint swellings or unusual aches or pains  Skin: No change in the color of the skin, palor , no  Rash  Allergic, immunologic: + environmental allergies -- better with OTCs, no  food allergies  Neurological: No dizziness no  syncope. No headaches. No diplopia, no slurred, no slurred speech, no motor deficits, no facial  Numbness  Hematological: No enlarged lymph nodes, no easy bruising , no unusual bleedings  Psychiatry: No suicidal ideas, no hallucinations, no beavior problems, no confusion.  No unusual/severe anxiety, no depression   Past Medical History:  Diagnosis Date  . Anxiety   . Diabetes ([place]) 12-2012  . GERD (gastroesophageal reflux disease)   . Hyperlipidemia   . Hypertension   . Osteoarthritis   . Osteopenia   . Screening for AAA (abdominal aortic aneurysm) 11/2009   aorta U/S, no AAA    Past Surgical History:  Procedure Laterality Date  . NO PAST SURGERIES      Social History   Social History  . Marital status: Married    Spouse  name: Coralee Pesa  . Number of children: 3  . Years of education: N/A   Occupational History  . retired    .  Retired   Social History Main Topics  . Smoking status: Former Smoker    Quit date: 10/15/1981  . Smokeless tobacco: Never Used     Comment: was a social smoker  . Alcohol use Yes     Comment: wine sometimes  . Drug use: No  . Sexual activity: Not on file   Other Topics Concern  . Not on file   Social History Narrative    3 daughters live in [place], 5 G-children, 1 GG son, married x 3      Family History  Problem Relation Age of Onset  . Breast cancer Other     aunt   . Coronary artery disease Mother     M at age 69 and F  . Diabetes Mother     M, F, daughter, sister  . Hypertension Mother   . Stroke Father   . Asthma Sister   . Colon cancer Neg Hx        Medication List       Accurate as of 09/03/16  8:08 AM. Always use your most recent med list.          ALPRAZolam 0.25 MG tablet Commonly known as:  XANAX Take 1 tablet (0.25 mg total) by mouth 3 (three) times daily as needed for  sleep.   aspirin 81 MG tablet Take 81 mg by mouth daily.   CALCIUM + D PO Take by mouth.   escitalopram 10 MG tablet Commonly known as:  LEXAPRO Take 1 tablet (10 mg total) by mouth daily.   glucosamine-chondroitin 500-400 MG tablet Take 1 tablet by mouth 3 (three) times daily.   hydrocortisone cream 1 % Apply to affected area 2 times daily   lisinopril-hydrochlorothiazide 10-12.5 MG tablet Commonly known as:  PRINZIDE,ZESTORETIC Take 1 tablet by mouth daily.   meloxicam 7.5 MG tablet Commonly known as:  MOBIC Take 1 tablet (7.5 mg total) by mouth daily as needed. for pain   metFORMIN 500 MG tablet Commonly known as:  GLUCOPHAGE Take 1 tablet (500 mg total) by mouth 2 (two) times daily with a meal.   multivitamin capsule Take 1 capsule by mouth daily.   ONETOUCH DELICA LANCETS FINE Misc Check blood sugars once  daily.   ONETOUCH VERIO test strip Generic  drug:  glucose blood Check blood sugars once  daily.   pantoprazole 40 MG tablet Commonly known as:  PROTONIX Take 1 tablet (40 mg total) by mouth daily.   simvastatin 80 MG tablet Commonly known as:  ZOCOR Take 1 tablet (80 mg total) by mouth at bedtime.          Objective:   Physical Exam BP 124/80 (BP Location: Left Arm, Patient Position: Sitting, Cuff Size: Small)   Pulse 68   Temp 97.9 F (36.6 C) (Oral)   Resp 14   Ht 5' (1.524 m)   Wt 138 lb 8 oz (62.8 kg)   SpO2 97%   BMI 27.05 kg/m   General:   Well developed, well nourished . NAD.  Neck: No  thyromegaly  HEENT:  Normocephalic . Face symmetric, atraumatic Lungs:  CTA B Normal respiratory effort, no intercostal retractions, no accessory muscle use. Heart: RRR,  no murmur.  No pretibial edema bilaterally  Abdomen:  Not distended, soft, non-tender. No rebound or rigidity.   Skin: Exposed areas without rash. Not pale. Not jaundice Neurologic:  alert & oriented X3.  Speech normal, gait appropriate for age and unassisted Strength symmetric and appropriate for age.  Psych: Cognition and judgment appear intact.  Cooperative with normal attention span and concentration.  Behavior appropriate. No anxious or depressed appearing.    Assessment & Plan:    Assessment Diabetes 2014 HTN Hyperlipidemia Anxiety DJD GERD Osteopenia; dexa 07-2016 wnl  Ao [place] (-)  AAA 2010  PLAN:  DM, HTN, hyperlipidemia, anxiety, GERD: Chronic medical problems seem stable, hardly ever takes Xanax, good compliance with all medications, ambulatory BPs, CBGs within normal. Check labs. Try to decrease PPIs to every other day. Continue Claritin for allergies RTC 6 months.

## 2016-11-11 ENCOUNTER — Encounter: Payer: Self-pay | Admitting: Internal Medicine

## 2016-11-19 ENCOUNTER — Other Ambulatory Visit: Payer: Self-pay | Admitting: Internal Medicine

## 2016-12-11 ENCOUNTER — Encounter: Payer: Self-pay | Admitting: Family Medicine

## 2016-12-11 ENCOUNTER — Ambulatory Visit (INDEPENDENT_AMBULATORY_CARE_PROVIDER_SITE_OTHER): Payer: Medicare Other | Admitting: Family Medicine

## 2016-12-11 VITALS — BP 126/63 | HR 89 | Temp 99.0°F | Ht 60.0 in | Wt 137.6 lb

## 2016-12-11 DIAGNOSIS — J189 Pneumonia, unspecified organism: Secondary | ICD-10-CM

## 2016-12-11 MED ORDER — AZITHROMYCIN 250 MG PO TABS: ORAL_TABLET | ORAL | 0 refills | Status: DC

## 2016-12-11 MED ORDER — BENZONATATE 100 MG PO CAPS: 100.0000 mg | ORAL_CAPSULE | Freq: Three times a day (TID) | ORAL | 0 refills | Status: DC | PRN

## 2016-12-11 MED ORDER — AZITHROMYCIN 250 MG PO TABS
ORAL_TABLET | ORAL | 0 refills | Status: DC
Start: 2016-12-13 — End: 2016-12-11

## 2016-12-11 MED FILL — BENZONATATE 100 MG CAPSULE: 100 | 7 days supply | Qty: 20 | Fill #0

## 2016-12-11 MED FILL — AZITHROMYCIN 250 MG TABLET: 250 | 5 days supply | Qty: 6 | Fill #0

## 2016-12-11 NOTE — Progress Notes (Signed)
Pre visit review using our clinic review tool, if applicable. No additional management support is needed unless otherwise documented below in the visit note. 

## 2016-12-11 NOTE — Progress Notes (Signed)
Chief Complaint  Patient presents with  . Cough    c/o cough and sneezing that has been present for over a week.     Stickney here for URI complaints.  Duration: 2 weeks  Associated symptoms: sinus congestion, rhinorrhea, cough Denies: subjective fever, sinus pain, itchy watery eyes, ear pain, ear drainage, sore throat and shortness of breath Treatment to date: Coricidin  Sick contacts: Yes- granddaughter and husband  ROS:  Const: Denies fevers HEENT: As noted in HPI Lungs: No SOB  Past Medical History:  Diagnosis Date  . Anxiety   . Diabetes ([place]) 12-2012  . GERD (gastroesophageal reflux disease)   . Hyperlipidemia   . Hypertension   . Osteoarthritis   . Osteopenia   . Screening for AAA (abdominal aortic aneurysm) 11/2009   aorta U/S, no AAA   Family History  Problem Relation Age of Onset  . Breast cancer Other     aunt   . Coronary artery disease Mother     M at age 70 and F  . Diabetes Mother     M, F, daughter, sister  . Hypertension Mother   . Stroke Father   . Asthma Sister   . Colon cancer Neg Hx     BP 126/63   Pulse 89   Temp 99 F (37.2 C) (Oral)   Ht 5' (1.524 m)   Wt 137 lb 9.6 oz (62.4 kg)   SpO2 94%   BMI 26.87 kg/m  General: Awake, alert, appears stated age HEENT: AT, [place], ears patent b/l and TM's neg, nares patent w/o discharge, pharynx pink and without exudates, MMM Neck: No masses or asymmetry Heart: RRR, no murmurs, no bruits Lungs: CTAB, no accessory muscle use Psych: Age appropriate judgment and insight, normal mood and affect  Walking pneumonia - Plan: azithromycin (ZITHROMAX) 250 MG tablet, benzonatate (TESSALON) 100 MG capsule, DISCONTINUED: benzonatate (TESSALON) 100 MG capsule, DISCONTINUED: azithromycin (ZITHROMAX) 250 MG tablet  Orders as above. Supportive care with Tessalon pearls for the next 2 days. If she continues to improve, continue with this plan. If she worsens or fails to improve, located use the antibiotic.   Recommended INCS and antihistamine use also. Practice good hand hygiene, cover mouth, push fluids. F/u in 1 week if symptoms worsen or fail to improve. Pt voiced understanding and agreement to the plan.  [place], DO 12/11/16 12:17 PM

## 2016-12-11 NOTE — Patient Instructions (Signed)
Wait 2 days while doing supportive care before taking antibiotic. If you get better, do not fill it. If you fail to improve or start getting worse, take the antibiotic.  Claritin (loratadine), Allegra (fexofenadine), Zyrtec (cetirizine); these are listed in order from weakest to strongest. Generic, and therefore cheaper, options are in the parentheses.   Flonase (fluticasone); nasal spray that is over the counter. 2 sprays each nostril, once daily. Aim towards the same side eye when you spray.  There are available OTC, and the generic versions, which may be cheaper, are in parentheses. Show this to a pharmacist if you have trouble finding any of these items.

## 2016-12-21 ENCOUNTER — Other Ambulatory Visit: Payer: Self-pay | Admitting: Internal Medicine

## 2016-12-28 ENCOUNTER — Telehealth: Payer: Self-pay | Admitting: *Deleted

## 2016-12-28 NOTE — Telephone Encounter (Signed)
Scheduled 01/07/17 @9 .

## 2017-01-06 NOTE — Progress Notes (Signed)
Pre visit review using our clinic review tool, if applicable. No additional management support is needed unless otherwise documented below in the visit note. 

## 2017-01-06 NOTE — Progress Notes (Addendum)
Subjective:   Monique Bauer is a 81 y.o. female who presents for Medicare Annual (Subsequent) preventive examination.  Review of Systems:  No ROS.  Medicare Wellness Visit.  Cardiac Risk Factors include: advanced age (>6men, >57 women);diabetes mellitus;dyslipidemia;hypertension Sleep patterns: Falls asleep easy. Wakes up to lets dogs out. Sleeps about 7 hrs per night.  Home Safety/Smoke Alarms: Feels safe in home. Smoke alarms in place.    Living environment; residence and Firearm Safety: Lives in 1 story home with dogs and husband. Guns safely stored. Seat Belt Safety/Bike Helmet: Wears seat belt.   Counseling:   Eye Exam- Dr.Stonecipeher annually for glasses and diabetic eye exams. Appt next month.  Dental- Dr.Reaves every 6 months. Appt next Wed.  Female:   Pap- Pt states she is no longer getting screening.       Mammo- Last 07/21/16: BI-Rads 2: Benign      Dexa scan- Last 07/17/16: Normal    CCS- Last 03/14/06: Normal- Pt states MD told her she no longer needs screening.      Objective:     Vitals: BP 140/80 (BP Location: Left Arm, Patient Position: Sitting, Cuff Size: Normal)   Pulse 61   Ht 5' (1.524 m)   Wt 140 lb 3.2 oz (63.6 kg)   SpO2 98%   BMI 27.38 kg/m   Body mass index is 27.38 kg/m.   Tobacco History  Smoking Status  . Former Smoker  . Quit date: 10/15/1981  Smokeless Tobacco  . Never Used    Comment: was a social smoker     Counseling given: No   Past Medical History:  Diagnosis Date  . Anxiety   . Diabetes ([place]) 12-2012  . GERD (gastroesophageal reflux disease)   . Hyperlipidemia   . Hypertension   . Osteoarthritis   . Osteopenia   . Screening for AAA (abdominal aortic aneurysm) 11/2009   aorta U/S, no AAA   Past Surgical History:  Procedure Laterality Date  . NO PAST SURGERIES     Family History  Problem Relation Age of Onset  . Breast cancer Other     aunt   . Coronary artery disease Mother     M at age 74 and F  . Diabetes  Mother     M, F, daughter, sister  . Hypertension Mother   . Stroke Father   . Asthma Sister   . Colon cancer Neg Hx    History  Sexual Activity  . Sexual activity: No    Outpatient Encounter Prescriptions as of 01/07/2017  Medication Sig  . aspirin 81 MG tablet Take 81 mg by mouth daily.    . Calcium Carbonate-Vitamin D (CALCIUM + D PO) Take by mouth.    . escitalopram (LEXAPRO) 10 MG tablet Take 1 tablet (10 mg total) by mouth daily.  Marland Kitchen glucosamine-chondroitin 500-400 MG tablet Take 1 tablet by mouth 3 (three) times daily.    . hydrocortisone cream 1 % Apply to affected area 2 times daily  . lisinopril-hydrochlorothiazide (PRINZIDE,ZESTORETIC) 10-12.5 MG tablet Take 1 tablet by mouth daily.  . meloxicam (MOBIC) 7.5 MG tablet Take 1 tablet (7.5 mg total) by mouth daily as needed. for pain  . metFORMIN (GLUCOPHAGE) 500 MG tablet Take 1 tablet (500 mg total) by mouth 2 (two) times daily with a meal.  . Multiple Vitamin (MULTIVITAMIN) capsule Take 1 capsule by mouth daily.    Glory Rosebush DELICA LANCETS FINE MISC Check blood sugars once  daily.  Glory Rosebush  VERIO test strip Check blood sugars once  daily.  . pantoprazole (PROTONIX) 40 MG tablet Take 1 tablet (40 mg total) by mouth daily.  . simvastatin (ZOCOR) 80 MG tablet Take 1 tablet (80 mg total) by mouth at bedtime.  . [DISCONTINUED] azithromycin (ZITHROMAX) 250 MG tablet Take 2 tabs the first day and then 1 tab daily until you run out.  Marland Kitchen ALPRAZolam (XANAX) 0.25 MG tablet Take 1 tablet (0.25 mg total) by mouth 3 (three) times daily as needed for sleep. (Patient not taking: Reported on 01/07/2017)  . [DISCONTINUED] benzonatate (TESSALON) 100 MG capsule Take 1 capsule (100 mg total) by mouth 3 (three) times daily as needed for cough.   No facility-administered encounter medications on file as of 01/07/2017.     Activities of Daily Living In your present state of health, do you have any difficulty performing the following activities:  01/07/2017 09/03/2016  Hearing? N N  Vision? N N  Difficulty concentrating or making decisions? N N  Walking or climbing stairs? N N  Dressing or bathing? N N  Doing errands, shopping? N N  Preparing Food and eating ? N -  Using the Toilet? N -  In the past six months, have you accidently leaked urine? N -  Do you have problems with loss of bowel control? N -  Managing your Medications? N -  Managing your Finances? N -  Housekeeping or managing your Housekeeping? N -  Some recent data might be hidden    Patient Care Team: Colon Branch, MD as PCP - Clayton, MD as Consulting Physician (Ophthalmology)    Assessment:     Physical assessment deferred to PCP.  Exercise Activities and Dietary recommendations Current Exercise Habits: Structured exercise class, Type of exercise: strength training/weights;stretching;walking, Time (Minutes): > 60, Frequency (Times/Week): 3, Weekly Exercise (Minutes/Week): 0   Diet (meal preparation, eat out, water intake, caffeinated beverages, dairy products, fruits and vegetables): well balanced, diabetic   Goals    . Weight (lb) < 130 lb (59 kg)          Pt in weight loss support group weekly.      Fall Risk Fall Risk  01/07/2017 09/03/2016 03/31/2016 08/28/2015 04/19/2015  Falls in the past year? No No No No No   Depression Screen PHQ 2/9 Scores 01/07/2017 09/03/2016 03/31/2016 08/28/2015  PHQ - 2 Score 0 0 0 0     Cognitive Function MMSE - Mini Mental State Exam 01/07/2017  Orientation to time 5  Orientation to Place 5  Registration 3  Attention/ Calculation 5  Recall 3  Language- name 2 objects 2  Language- repeat 1  Language- follow 3 step command 3  Language- read & follow direction 1  Write a sentence 1  Copy design 1  Total score 30        Immunization History  Administered Date(s) Administered  . Influenza Split 09/18/2011, 09/14/2012  . Influenza Whole 09/28/2007, 10/03/2008, 09/11/2009, 09/16/2010  . Influenza,  High Dose Seasonal PF 09/11/2015, 09/03/2016  . Influenza,inj,Quad PF,36+ Mos 09/06/2013, 09/05/2014  . Pneumococcal Conjugate-13 04/19/2015  . Pneumococcal Polysaccharide-23 12/14/2005, 01/11/2014  . Td 12/14/1998, 11/11/2009  . Zoster 01/25/2008   Screening Tests Health Maintenance  Topic Date Due  . OPHTHALMOLOGY EXAM  02/25/2017  . HEMOGLOBIN A1C  03/03/2017  . FOOT EXAM  03/31/2017  . MAMMOGRAM  07/17/2017  . TETANUS/TDAP  11/12/2019  . INFLUENZA VACCINE  Completed  . DEXA SCAN  Completed  . ZOSTAVAX  Completed  . PNA vac Low Risk Adult  Completed      Plan:     Follow up with Dr.Paz as scheduled 03/04/17.  Continue to eat heart healthy diet (full of fruits, vegetables, whole grains, lean protein, water--limit salt, fat, and sugar intake) and increase physical activity as tolerated.  Continue doing brain stimulating activities (puzzles, reading, adult coloring books, staying active) to keep memory sharp.   During the course of the visit the patient was educated and counseled about the following appropriate screening and preventive services:   Vaccines to include Pneumoccal, Influenza, Hepatitis B, Td, Zostavax, HCV  Cardiovascular Disease  Colorectal cancer screening  Bone density screening  Diabetes screening  Glaucoma screening  Mammography/PAP  Nutrition counseling   Patient Instructions (the written plan) was given to the patient.   Naaman Plummer [place], [place]  01/07/2017  Kathlene November, MD

## 2017-01-07 ENCOUNTER — Encounter: Payer: Self-pay | Admitting: *Deleted

## 2017-01-07 ENCOUNTER — Ambulatory Visit (INDEPENDENT_AMBULATORY_CARE_PROVIDER_SITE_OTHER): Payer: Medicare Other | Admitting: *Deleted

## 2017-01-07 VITALS — BP 140/80 | HR 61 | Ht 60.0 in | Wt 140.2 lb

## 2017-01-07 DIAGNOSIS — Z0000 Encounter for general adult medical examination without abnormal findings: Secondary | ICD-10-CM | POA: Diagnosis not present

## 2017-01-07 NOTE — Patient Instructions (Addendum)
Follow up with Dr.Paz as scheduled 03/04/17.  Continue to eat heart healthy diet (full of fruits, vegetables, whole grains, lean protein, water--limit salt, fat, and sugar intake) and increase physical activity as tolerated.  Continue doing brain stimulating activities (puzzles, reading, adult coloring books, staying active) to keep memory sharp.   KEEP UP THE GREAT WORK!! YOU ARE AMAZING!!

## 2017-01-19 ENCOUNTER — Encounter: Payer: Self-pay | Admitting: Internal Medicine

## 2017-01-22 ENCOUNTER — Encounter: Payer: Self-pay | Admitting: Internal Medicine

## 2017-01-22 ENCOUNTER — Ambulatory Visit (INDEPENDENT_AMBULATORY_CARE_PROVIDER_SITE_OTHER): Payer: Medicare Other | Admitting: Internal Medicine

## 2017-01-22 VITALS — BP 124/62 | HR 61 | Temp 97.9°F | Resp 14 | Ht 60.0 in | Wt 141.4 lb

## 2017-01-22 DIAGNOSIS — M79601 Pain in right arm: Secondary | ICD-10-CM | POA: Diagnosis not present

## 2017-01-22 MED ORDER — PREDNISONE 10 MG PO TABS: ORAL_TABLET | ORAL | 0 refills | Status: DC

## 2017-01-22 MED FILL — predniSONE 10 MG TABS: 10 | 6 days supply | Qty: 12 | Fill #0

## 2017-01-22 NOTE — Progress Notes (Signed)
Pre visit review using our clinic review tool, if applicable. No additional management support is needed unless otherwise documented below in the visit note. 

## 2017-01-22 NOTE — Progress Notes (Signed)
Subjective:    Patient ID: Monique Bauer, female    DOB: 14-Jul-1936, 81 y.o.   MRN: [id number]  DOS:  01/22/2017 Type of visit - description : acute Interval history: Symptoms started a week ago, had neck pain briefly, then she developed  itching/sensitivity from the shoulder down to the hand in a C6 distribution, then she developed swelling at the right wrist. The swelling has already improved. Pain seems to be generating from the distal arm, when she moves it the pain radiates proximately. No rash, no injury. Arm not been red or warm.    Review of Systems   Past Medical History:  Diagnosis Date  . Anxiety   . Diabetes ([place]) 12-2012  . GERD (gastroesophageal reflux disease)   . Hyperlipidemia   . Hypertension   . Osteoarthritis   . Osteopenia   . Screening for AAA (abdominal aortic aneurysm) 11/2009   aorta U/S, no AAA    Past Surgical History:  Procedure Laterality Date  . NO PAST SURGERIES      Social History   Social History  . Marital status: Married    Spouse name: Coralee Pesa  . Number of children: 3  . Years of education: N/A   Occupational History  . retired    .  Retired   Social History Main Topics  . Smoking status: Former Smoker    Quit date: 10/15/1981  . Smokeless tobacco: Never Used     Comment: was a social smoker  . Alcohol use Yes     Comment: wine sometimes  . Drug use: No  . Sexual activity: No   Other Topics Concern  . Not on file   Social History Narrative    3 daughters live in [place], 5 G-children, 1 GG son, married x 3       Allergies as of 01/22/2017      Reactions   Sulfamethoxazole-trimethoprim    REACTION: rash all over      Medication List       Accurate as of 01/22/17 11:59 PM. Always use your most recent med list.          ALPRAZolam 0.25 MG tablet Commonly known as:  XANAX Take 1 tablet (0.25 mg total) by mouth 3 (three) times daily as needed for sleep.   aspirin 81 MG tablet Take 81 mg by mouth daily.     CALCIUM + D PO Take by mouth.   escitalopram 10 MG tablet Commonly known as:  LEXAPRO Take 1 tablet (10 mg total) by mouth daily.   glucosamine-chondroitin 500-400 MG tablet Take 1 tablet by mouth 3 (three) times daily.   hydrocortisone cream 1 % Apply to affected area 2 times daily   lisinopril-hydrochlorothiazide 10-12.5 MG tablet Commonly known as:  PRINZIDE,ZESTORETIC Take 1 tablet by mouth daily.   meloxicam 7.5 MG tablet Commonly known as:  MOBIC Take 1 tablet (7.5 mg total) by mouth daily as needed. for pain   metFORMIN 500 MG tablet Commonly known as:  GLUCOPHAGE Take 1 tablet (500 mg total) by mouth 2 (two) times daily with a meal.   multivitamin capsule Take 1 capsule by mouth daily.   ONETOUCH DELICA LANCETS FINE Misc Check blood sugars once  daily.   ONETOUCH VERIO test strip Generic drug:  glucose blood Check blood sugars once  daily.   pantoprazole 40 MG tablet Commonly known as:  PROTONIX Take 1 tablet (40 mg total) by mouth daily.   predniSONE 10 MG tablet Commonly  known as:  DELTASONE 3 tabs x 2 days, 2 tabs x 2 days, 1 tab x 2 days   simvastatin 80 MG tablet Commonly known as:  ZOCOR Take 1 tablet (80 mg total) by mouth at bedtime.          Objective:   Physical Exam  Musculoskeletal:       Arms: Skin:      BP 124/62 (BP Location: Left Arm, Patient Position: Sitting, Cuff Size: Small)   Pulse 61   Temp 97.9 F (36.6 C) (Oral)   Resp 14   Ht 5' (1.524 m)   Wt 141 lb 6 oz (64.1 kg)   SpO2 97%   BMI 27.61 kg/m  General:   Well developed, well nourished . NAD.  HEENT:  Normocephalic . Face symmetric, atraumatic Neck: No TTP at the cervical spine, normal range of motion  MSK: Wrists symmetric without synovitis. Mild to moderately TTP @ R wrist. Skin: no rash at the neck, chest or arms Neurologic:  alert & oriented X3.  Speech normal, gait appropriate for age and unassisted Strength symmetric Psych--  Cognition and  judgment appear intact.  Cooperative with normal attention span and concentration.  Behavior appropriate. No anxious or depressed appearing.      Assessment & Plan:    Assessment Diabetes 2014 HTN Hyperlipidemia Anxiety DJD GERD Osteopenia; dexa 07-2016 wnl  Ao [place] (-)  AAA 2010  PLAN:  R wrist, hand pain, right arm sensitivity: DDX is large but includes tendinitis, radiculopathy, shingles,  others. Although pain resembles radiculopathy she has more evidence of tendinitis at the wrist and thumb; swelling is already getting better. Will Rx prednisone for a few days. Continue meloxicam as needed, avoid repetitive motion (she uses her IPAD "all day") . CTS brace. Call if not improving.

## 2017-01-22 NOTE — Patient Instructions (Addendum)
Avoid repetitive motion  Take prednisone for a few days   meloxicam as needed  Please use a carpal tunnel syndrome brace that you can get at the pharmacy  Call if not gradually improving

## 2017-01-23 NOTE — Assessment & Plan Note (Signed)
R wrist, hand pain, right arm sensitivity: DDX is large but includes tendinitis, radiculopathy, shingles,  others. Although pain resembles radiculopathy she has more evidence of tendinitis at the wrist and thumb; swelling is already getting better. Will Rx prednisone for a few days. Continue meloxicam as needed, avoid repetitive motion (she uses her IPAD "all day") . CTS brace. Call if not improving.

## 2017-02-22 ENCOUNTER — Other Ambulatory Visit: Payer: Self-pay | Admitting: Internal Medicine

## 2017-02-22 ENCOUNTER — Encounter: Payer: Self-pay | Admitting: Internal Medicine

## 2017-02-22 DIAGNOSIS — M79601 Pain in right arm: Secondary | ICD-10-CM

## 2017-02-22 MED ORDER — HYDROCODONE-ACETAMINOPHEN 5-325 MG PO TABS: 1.0000 | ORAL_TABLET | Freq: Three times a day (TID) | ORAL | 0 refills | Status: DC | PRN

## 2017-02-22 NOTE — Telephone Encounter (Signed)
Sports med referral placed. Hydrocodone Rx placed at front desk for pick up.

## 2017-02-23 MED FILL — HYDROCODON-APAP 5-325: 5-325 | 5 days supply | Qty: 30 | Fill #0

## 2017-02-25 ENCOUNTER — Encounter: Payer: Self-pay | Admitting: Family Medicine

## 2017-02-25 ENCOUNTER — Ambulatory Visit (INDEPENDENT_AMBULATORY_CARE_PROVIDER_SITE_OTHER): Payer: Medicare Other | Admitting: Family Medicine

## 2017-02-25 VITALS — HR 69 | Ht 60.0 in | Wt 139.0 lb

## 2017-02-25 DIAGNOSIS — M5010 Cervical disc disorder with radiculopathy, unspecified cervical region: Secondary | ICD-10-CM

## 2017-02-25 NOTE — Patient Instructions (Signed)
You have cervical radiculopathy (a pinched nerve in the neck). Aleve 2 tabs twice a day with food for pain and inflammation as needed. Consider cervical collar if severely painful. Simple range of motion exercises within limits of pain to prevent further stiffness. Start physical therapy for stretching, exercises, traction, and modalities. Heat 15 minutes at a time 3-4 times a day to help with spasms. Watch head position when on computers, texting, when sleeping in bed - should in line with back to prevent further nerve traction and irritation. Consider home traction unit if you get benefit with this in physical therapy. If not improving we will consider an MRI. Follow up with me in 1 month but call me sooner if you're struggling.

## 2017-03-01 ENCOUNTER — Ambulatory Visit: Payer: Medicare Other | Attending: Family Medicine | Admitting: Physical Therapy

## 2017-03-01 DIAGNOSIS — M542 Cervicalgia: Secondary | ICD-10-CM

## 2017-03-01 DIAGNOSIS — M5412 Radiculopathy, cervical region: Secondary | ICD-10-CM | POA: Insufficient documentation

## 2017-03-01 DIAGNOSIS — M79601 Pain in right arm: Secondary | ICD-10-CM

## 2017-03-01 DIAGNOSIS — R293 Abnormal posture: Secondary | ICD-10-CM

## 2017-03-01 NOTE — Therapy (Signed)
[facility] [address]  [place], [place], [address] Phone: [phone]   Fax:  [phone]  Physical Therapy Evaluation  Patient Details  Name: Monique Bauer MRN: [id number] Date of Birth: 07/07/1936 Referring Provider: Karlton Lemon, MD  Encounter Date: 03/01/2017      PT End of Session - 03/01/17 1017    Visit Number 1   Number of Visits 12   Date for PT Re-Evaluation 04/16/17   Authorization Type UHC Medicare - [place]: [place]   PT Start Time 1017   PT Stop Time 1105   PT Time Calculation (min) 48 min   Activity Tolerance Patient tolerated treatment well   Behavior During Therapy [facility] for tasks assessed/performed      Past Medical History:  Diagnosis Date  . Anxiety   . Diabetes ([place]) 12-2012  . GERD (gastroesophageal reflux disease)   . Hyperlipidemia   . Hypertension   . Osteoarthritis   . Osteopenia   . Screening for AAA (abdominal aortic aneurysm) 11/2009   aorta U/S, no AAA    Past Surgical History:  Procedure Laterality Date  . NO PAST SURGERIES      There were no vitals filed for this visit.       Subjective Assessment - 03/01/17 1021    Subjective Pt reports onset of shooting pains up her R arm a few weeks ago. Saw Dr. Larose Kells who questioned carpal tunnel syndrome, so she stopped her knitting/crocheting but did not note any changes. Then saw Dr. Barbaraann Barthel who suggested ut may be a pinched nerve and referred her to PT.   Pertinent History DM, OA, osteopenia   Diagnostic tests n/a   Patient Stated Goals "pain-free"   Currently in Pain? No/denies   Pain Score 0-No pain  least 0/10, avg/worst 8/10   Pain Location Arm   Pain Orientation Right   Pain Descriptors / Indicators Sharp;Burning   Pain Type Acute pain   Pain Radiating Towards originating at wrist and extending up arm to mid upper arm   Pain Onset More than a month ago  ~6 wks   Pain Frequency Intermittent   Aggravating Factors   looking at tablet using thumb to scroll   Pain Relieving Factors heat   Effect of Pain on Daily Activities unable to vacuum, unable to knit or crochet            [facility] [place] [facility] PT Assessment - 03/01/17 1017      Assessment   Medical Diagnosis Cervical disc disorder with radiculopathy   Referring Provider Karlton Lemon, MD   Onset Date/Surgical Date --  ~6 wks   Hand Dominance Right   Next MD Visit 03/25/17   Prior Therapy none     Balance Screen   Has the patient fallen in the past 6 months No   Has the patient had a decrease in activity level because of a fear of falling?  Yes   Is the patient reluctant to leave their home because of a fear of falling?  No     Home Social worker Private residence   Living Arrangements Spouse/significant other   Type of [place] to enter   Entrance Stairs-Number of Steps 1   [place] One level     Prior Function   Level of Independence Independent   Vocation Retired   Surveyor, mining for [employer], Editor, commissioning, 3x/wk - [employer]  cardio at [employer] (not currently going)     Observation/Other Assessments   Focus on Therapeutic Outcomes (FOTO)  Neck - 56% (44% limitation); predicted 67% (33% limitation)     Posture/Postural Control   Posture/Postural Control Postural limitations   Postural Limitations Forward head;Rounded Shoulders     ROM / Strength   AROM / PROM / Strength AROM;Strength     AROM   AROM Assessment Site Cervical;Shoulder   Right/Left Shoulder Right;Left   Right Shoulder Flexion 138 Degrees  pain/discomfort   Right Shoulder ABduction 135 Degrees  pain/discomfort   Right Shoulder Internal Rotation 71 Degrees  pain   Right Shoulder External Rotation 82 Degrees  pain with functional ER to C6   Left Shoulder Flexion 155 Degrees   Left Shoulder ABduction 149 Degrees   Left Shoulder Internal Rotation 82 Degrees   Left Shoulder  External Rotation 92 Degrees   Cervical Flexion 30   Cervical Extension 42   Cervical - Right Side Bend 20  pain   Cervical - Left Side Bend 19   Cervical - Right Rotation 47  pain   Cervical - Left Rotation 40     Strength   Strength Assessment Site Shoulder;Hand   Right/Left Shoulder Right;Left   Right Shoulder Flexion 4/5   Right Shoulder ABduction 4-/5   Right Shoulder Internal Rotation 4-/5   Right Shoulder External Rotation 4-/5   Left Shoulder Flexion 4+/5   Left Shoulder ABduction 4+/5   Left Shoulder Internal Rotation 4+/5   Left Shoulder External Rotation 4/5   Right/Left hand Right;Left   Right Hand Grip (lbs) 18, 15, 11   Left Hand Grip (lbs) 16, 13, 11     Palpation   Palpation comment ttp over R lateral cervical mm & UT with increased tension noted in B UT                   [facility] Adult PT Treatment/Exercise - 03/01/17 1017      Self-Care   Self-Care Posture   Posture Educated pt on neutral spine posture and provided ideas for maintaining neutral spine while sitting in recliner (pillow lengthwise along back with small towel roll for neck +/- low back).     Exercises   Exercises Neck     Neck Exercises: Seated   Neck Retraction 10 reps  5" hold   Other Seated Exercise B scapular retraction 10x5"     Neck Exercises: Stretches   Corner Stretch 30 seconds;1 rep   Corner Stretch Limitations 3-way doorway stretch - low/mid/high                PT Education - 03/01/17 1105    Education provided Yes   Education Details PT eval findings, POC, postural education & initial HEP   Person(s) Educated Patient   Methods Explanation;Demonstration;Handout   Comprehension Verbalized understanding;Returned demonstration;Need further instruction          PT Short Term Goals - 03/01/17 1105      PT SHORT TERM GOAL #1   Title Independent with initial HEP by 03/15/17   Status New     PT SHORT TERM GOAL #2   Title Pt will verbalize understanding  of neutral spine and shoulder posture by 03/15/17   Status New           PT Long Term Goals - 03/01/17 1106      PT LONG TERM GOAL #1   Title Independent with advanced HEP + gym program  as indicated by 04/16/17   Status New     PT LONG TERM GOAL #2   Title Cervical ROM WFL w/o pain by 04/16/17   Status New     PT LONG TERM GOAL #3   Title R shoulder ROM w/in 5 dg of L w/o increased pain by 04/16/17   Status New     PT LONG TERM GOAL #4   Title R shoulder strength >/=4+/5 by 04/16/17   Status New     PT LONG TERM GOAL #5   Title Pt will report no limitation in daily chores, driving or leisure activities (knitting/crochet) due to neck or R UE pain by 04/16/17               Plan - Mar 17, 2017 1105    Clinical Impression Statement Monique Bauer is a 81 y/o R hand dominant female who presents to OP PT with 6 wk h/o R UE pain, itching, numbness and tingling believed to cervical radiculopathy.  Pt reports symptoms originated as itching at wrist level and progressed to pain, numbness and tingling which seems to spread upward along her arm to mid biceps region. Assessment revealed forward head and shoulder posture with increased muscle tension in pecs, R cervical paraspinals, upper trap and levator scapulae. Cervical ROM decreased in all planes with tightness/stiffness reported in flexion, B rotation and sidebending; along with increased pain in R sidebending and rotation. R shoulder ROM limited relative to L with pain also noted at end range for all shoulder motions. Mild to moderate weakness noted in R shoulder with grip strength essentially symmetrical but pt noting increased incidence of dropping objects with R hand. Pt ttp over R lateral cervical musculature. Manual cervical distraction resolved R UE pain/radicular symptoms, but unable to reproduce symptoms with axial loading. Pain limits the patient's tolerance for driving, knitting/crocheting and household chores. POC will focus on postural and body  mechanics training to improve functional alignment, improving cervical flexibility along with soft tissue pliability, stretching to restore normal cervical ROM, scapular stabilization and shoulder strengthening, with manual therapy and modalities PRN for pain. May also consider mechanical traction, taping and/or DN as indicated.   Rehab Potential Good   Clinical Impairments Affecting Rehab Potential DM, OA, osteopenia   PT Frequency 2x / week   PT Duration 6 weeks   PT Treatment/Interventions Patient/family education;Neuromuscular re-education;Therapeutic exercise;Therapeutic activities;Functional mobility training;Manual techniques;Taping;Dry needling;Traction;Moist Heat;Electrical Stimulation;Ultrasound;Cryotherapy;Iontophoresis 4mg /ml Dexamethasone;ADLs/Self Care Home Management   PT Next Visit Plan Review initial HEP; Posture & body mechanics education; Postural strengthening - scapular stabilization + anterior stretching; Manual therapy including manual traction - consider mechanical traction if continued benefit noted; Modalities PRN   Consulted and Agree with Plan of Care Patient      Patient will benefit from skilled therapeutic intervention in order to improve the following deficits and impairments:  Pain, Decreased range of motion, Impaired flexibility, Decreased strength, Postural dysfunction, Improper body mechanics, Impaired UE functional use  Visit Diagnosis: Radiculopathy, cervical region  Pain in right arm  Cervicalgia  Abnormal posture      G-Codes - 03/17/2017 1726    Functional Assessment Tool Used (Outpatient Only) Neck FOTO = 56% (44% limitation); predicted 67% (33% limitation)   Functional Limitation Changing and maintaining body position   Changing and Maintaining Body Position Current Status (G5364) At least 40 percent but less than 60 percent impaired, limited or restricted   Changing and Maintaining Body Position Goal Status (W8032) At least 20 percent but less  than 40  percent impaired, limited or restricted       Problem List Patient Active Problem List   Diagnosis Date Noted  . PCP notes ------------------------- 08/28/2015  . Diabetes ([place]) 04/11/2013  . Annual physical exam 10/16/2011  . Anxiety-insomnia   . OSTEOPENIA 11/09/2008  . OSTEOARTHRITIS 09/26/2008  . Dyslipidemia 10/04/2007  . HTN (hypertension) 10/04/2007  . ALLERGIC RHINITIS 10/04/2007  . GERD 10/04/2007    Percival Spanish, PT, MPT 03/01/2017, 5:27 PM  [facility] [address]  [place], [place], [address] Phone: [phone]   Fax:  [phone]  Name: Monique Bauer MRN: [id number] Date of Birth: 02-16-1936

## 2017-03-02 DIAGNOSIS — M5010 Cervical disc disorder with radiculopathy, unspecified cervical region: Secondary | ICD-10-CM | POA: Insufficient documentation

## 2017-03-02 NOTE — Assessment & Plan Note (Signed)
consistent with either C6 or C7 radiculopathy.  She has tried prednisone and this has helped.  She would like to go ahead with physical therapy at this point so ordered this.  Aleve if needed.  Discussed ergonomic considerations.  Heat for spasms.  F/u in 1 month.

## 2017-03-02 NOTE — Progress Notes (Signed)
PCP and consultation requested by: Monique November, MD  Subjective:   HPI: Patient is a 81 y.o. female here for right arm pain.  Patient denies known injury or trauma. She states about 1 month ago she started to get pain in right side of neck and arm. This has progressed to 5/10 level, sharp. Associated pain in right hand and feels very sensitive. No prior issues. No rash. Is right handed. Took prednisone which did help. Tried wrist braces but felt this irritated her more. No bowel/bladder dysfunction. No skin changes.  Past Medical History:  Diagnosis Date  . Anxiety   . Diabetes ([place]) 12-2012  . GERD (gastroesophageal reflux disease)   . Hyperlipidemia   . Hypertension   . Osteoarthritis   . Osteopenia   . Screening for AAA (abdominal aortic aneurysm) 11/2009   aorta U/S, no AAA    Current Outpatient Prescriptions on File Prior to Visit  Medication Sig Dispense Refill  . ALPRAZolam (XANAX) 0.25 MG tablet Take 1 tablet (0.25 mg total) by mouth 3 (three) times daily as needed for sleep. 60 tablet 2  . aspirin 81 MG tablet Take 81 mg by mouth daily.      . Calcium Carbonate-Vitamin D (CALCIUM + D PO) Take by mouth.      . escitalopram (LEXAPRO) 10 MG tablet Take 1 tablet (10 mg total) by mouth daily. 90 tablet 2  . glucosamine-chondroitin 500-400 MG tablet Take 1 tablet by mouth 3 (three) times daily.      Marland Kitchen HYDROcodone-acetaminophen (NORCO/VICODIN) 5-325 MG tablet Take 1-2 tablets by mouth every 8 (eight) hours as needed. (Patient not taking: Reported on 03/01/2017) 30 tablet 0  . hydrocortisone cream 1 % Apply to affected area 2 times daily 15 g 0  . lisinopril-hydrochlorothiazide (PRINZIDE,ZESTORETIC) 10-12.5 MG tablet Take 1 tablet by mouth daily. 90 tablet 2  . meloxicam (MOBIC) 7.5 MG tablet Take 1 tablet (7.5 mg total) by mouth daily as needed. for pain 90 tablet 0  . metFORMIN (GLUCOPHAGE) 500 MG tablet Take 1 tablet (500 mg total) by mouth 2 (two) times daily with a meal.  180 tablet 2  . Multiple Vitamin (MULTIVITAMIN) capsule Take 1 capsule by mouth daily.      Glory Rosebush DELICA LANCETS FINE MISC Check blood sugars once  daily. 100 each 12  . ONETOUCH VERIO test strip Check blood sugars once  daily. 100 each 12  . pantoprazole (PROTONIX) 40 MG tablet Take 1 tablet (40 mg total) by mouth daily. (Patient taking differently: Take 40 mg by mouth every other day. ) 90 tablet 1  . predniSONE (DELTASONE) 10 MG tablet 3 tabs x 2 days, 2 tabs x 2 days, 1 tab x 2 days 12 tablet 0  . simvastatin (ZOCOR) 80 MG tablet Take 1 tablet (80 mg total) by mouth at bedtime. 90 tablet 1   No current facility-administered medications on file prior to visit.     Past Surgical History:  Procedure Laterality Date  . NO PAST SURGERIES      Allergies  Allergen Reactions  . Sulfamethoxazole-Trimethoprim     REACTION: rash all over    Social History   Social History  . Marital status: Married    Spouse name: Coralee Pesa  . Number of children: 3  . Years of education: N/A   Occupational History  . retired    .  Retired   Social History Main Topics  . Smoking status: Former Smoker    Quit date: 10/15/1981  .  Smokeless tobacco: Never Used     Comment: was a social smoker  . Alcohol use Yes     Comment: wine sometimes  . Drug use: No  . Sexual activity: No   Other Topics Concern  . Not on file   Social History Narrative    3 daughters live in [place], 5 G-children, 1 GG son, married x 3     Family History  Problem Relation Age of Onset  . Breast cancer Other     aunt   . Coronary artery disease Mother     M at age 44 and F  . Diabetes Mother     M, F, daughter, sister  . Hypertension Mother   . Stroke Father   . Asthma Sister   . Colon cancer Neg Hx     Pulse 69   Ht 5' (1.524 m)   Wt 139 lb (63 kg)   BMI 27.15 kg/m   Review of Systems: See HPI above.     Objective:  Physical Exam:  Gen: NAD, comfortable in exam room  Neck: No gross deformity,  swelling, bruising. TTP right cervical paraspinal region.  No midline/bony TTP. FROM neck - pain right lateral rotation. BUE strength 5/5.   Sensation mildly diminished 1st and 2nd digits, feels sensitive.   2+ equal reflexes in triceps, biceps, brachioradialis tendons. Negative spurlings.   Assessment & Plan:  1. Cervical radiculopathy - consistent with either C6 or C7 radiculopathy.  She has tried prednisone and this has helped.  She would like to go ahead with physical therapy at this point so ordered this.  Aleve if needed.  Discussed ergonomic considerations.  Heat for spasms.  F/u in 1 month.

## 2017-03-04 ENCOUNTER — Encounter: Payer: Self-pay | Admitting: Internal Medicine

## 2017-03-04 ENCOUNTER — Ambulatory Visit: Payer: Medicare Other | Admitting: Physical Therapy

## 2017-03-04 ENCOUNTER — Ambulatory Visit (INDEPENDENT_AMBULATORY_CARE_PROVIDER_SITE_OTHER): Payer: Medicare Other | Admitting: Internal Medicine

## 2017-03-04 VITALS — BP 124/80 | HR 58 | Temp 98.0°F | Resp 14 | Ht 60.0 in | Wt 140.4 lb

## 2017-03-04 DIAGNOSIS — I10 Essential (primary) hypertension: Secondary | ICD-10-CM

## 2017-03-04 DIAGNOSIS — M5412 Radiculopathy, cervical region: Secondary | ICD-10-CM

## 2017-03-04 DIAGNOSIS — M79601 Pain in right arm: Secondary | ICD-10-CM | POA: Diagnosis not present

## 2017-03-04 DIAGNOSIS — M542 Cervicalgia: Secondary | ICD-10-CM

## 2017-03-04 DIAGNOSIS — R293 Abnormal posture: Secondary | ICD-10-CM

## 2017-03-04 DIAGNOSIS — K219 Gastro-esophageal reflux disease without esophagitis: Secondary | ICD-10-CM

## 2017-03-04 LAB — BASIC METABOLIC PANEL
BUN: 21 mg/dL (ref 6–23)
CHLORIDE: 105 meq/L (ref 96–112)
CO2: 29 meq/L (ref 19–32)
CREATININE: 0.9 mg/dL (ref 0.40–1.20)
Calcium: 9.9 mg/dL (ref 8.4–10.5)
GFR: 64.3 mL/min (ref >60.00)
Glucose, Bld: 106 mg/dL — ABNORMAL HIGH (ref 70–99)
Potassium: 4 mEq/L (ref 3.5–5.1)
Sodium: 141 mEq/L (ref 135–145)

## 2017-03-04 MED ORDER — GABAPENTIN 300 MG PO CAPS: 300.0000 mg | ORAL_CAPSULE | Freq: Two times a day (BID) | ORAL | 3 refills | Status: DC

## 2017-03-04 MED FILL — GABAPENTIN 300 MG CAPSULE: 300 | 30 days supply | Qty: 60 | Fill #0

## 2017-03-04 NOTE — Progress Notes (Signed)
Pre visit review using our clinic review tool, if applicable. No additional management support is needed unless otherwise documented below in the visit note. 

## 2017-03-04 NOTE — Assessment & Plan Note (Signed)
HTN: Seems controlled, continue Zestoretic. Check a BMP GERD: Taking Protonix every other day, sxs relatively well controlled, does not like to step up the therapy. R arm pain: Since the last visit, took prednisone, no much help; currently on meloxicam and  Hydrocodone (qhs only d/t drowsiness). s/p eval by  sports medicine, sx felt to be a radiculopathy, on PT, second PT visit today. Patient still hurting, will add gabapentin 300 mg twice a day, see instructions, side effects discussed. Will notify sports medicine of gaba rx RTC 4 months

## 2017-03-04 NOTE — Progress Notes (Signed)
Subjective:    Patient ID: Monique Bauer, female    DOB: 10/14/1936, 81 y.o.   MRN: [id number]  DOS:  03/04/2017 Type of visit - description : f/u Interval history: Right arm pain, saw sports medicine, felt to be a radiculopathy, doing physical therapy. Still hurting. Taking meloxicam, with food. No apparent side effects. Take hydrocodone only at night HTN: Good med compliance.    Review of Systems Denies any painful rash at the right arm. No neck pain per se  Past Medical History:  Diagnosis Date  . Anxiety   . Diabetes ([place]) 12-2012  . GERD (gastroesophageal reflux disease)   . Hyperlipidemia   . Hypertension   . Osteoarthritis   . Osteopenia   . Screening for AAA (abdominal aortic aneurysm) 11/2009   aorta U/S, no AAA    Past Surgical History:  Procedure Laterality Date  . NO PAST SURGERIES      Social History   Social History  . Marital status: Married    Spouse name: Coralee Pesa  . Number of children: 3  . Years of education: N/A   Occupational History  . retired    .  Retired   Social History Main Topics  . Smoking status: Former Smoker    Quit date: 10/15/1981  . Smokeless tobacco: Never Used     Comment: was a social smoker  . Alcohol use Yes     Comment: wine sometimes  . Drug use: No  . Sexual activity: No   Other Topics Concern  . Not on file   Social History Narrative    3 daughters live in [place], 5 G-children, 1 GG son, married x 3       Allergies as of 03/04/2017      Reactions   Sulfamethoxazole-trimethoprim    REACTION: rash all over      Medication List       Accurate as of 03/04/17  8:29 AM. Always use your most recent med list.          ALPRAZolam 0.25 MG tablet Commonly known as:  XANAX Take 1 tablet (0.25 mg total) by mouth 3 (three) times daily as needed for sleep.   aspirin 81 MG tablet Take 81 mg by mouth daily.   CALCIUM + D PO Take by mouth.   escitalopram 10 MG tablet Commonly known as:  LEXAPRO Take 1 tablet  (10 mg total) by mouth daily.   glucosamine-chondroitin 500-400 MG tablet Take 1 tablet by mouth 3 (three) times daily.   HYDROcodone-acetaminophen 5-325 MG tablet Commonly known as:  NORCO/VICODIN Take 1-2 tablets by mouth every 8 (eight) hours as needed.   hydrocortisone cream 1 % Apply to affected area 2 times daily   lisinopril-hydrochlorothiazide 10-12.5 MG tablet Commonly known as:  PRINZIDE,ZESTORETIC Take 1 tablet by mouth daily.   meloxicam 7.5 MG tablet Commonly known as:  MOBIC Take 1 tablet (7.5 mg total) by mouth daily as needed. for pain   metFORMIN 500 MG tablet Commonly known as:  GLUCOPHAGE Take 1 tablet (500 mg total) by mouth 2 (two) times daily with a meal.   multivitamin capsule Take 1 capsule by mouth daily.   ONETOUCH DELICA LANCETS FINE Misc Check blood sugars once  daily.   ONETOUCH VERIO test strip Generic drug:  glucose blood Check blood sugars once  daily.   pantoprazole 40 MG tablet Commonly known as:  PROTONIX Take 1 tablet (40 mg total) by mouth daily.   predniSONE 10 MG  tablet Commonly known as:  DELTASONE 3 tabs x 2 days, 2 tabs x 2 days, 1 tab x 2 days   simvastatin 80 MG tablet Commonly known as:  ZOCOR Take 1 tablet (80 mg total) by mouth at bedtime.          Objective:   Physical Exam BP 124/80 (BP Location: Left Arm, Patient Position: Sitting, Cuff Size: Small)   Pulse (!) 58   Temp 98 F (36.7 C) (Oral)   Resp 14   Ht 5' (1.524 m)   Wt 140 lb 6 oz (63.7 kg)   SpO2 98%   BMI 27.42 kg/m  General:   Well developed, well nourished . NAD.  HEENT:  Normocephalic . Face symmetric, atraumatic Lungs:  CTA B Normal respiratory effort, no intercostal retractions, no accessory muscle use. Heart: RRR,  no murmur.  No pretibial edema bilaterally  Skin: Not pale. Not jaundice Neurologic:  alert & oriented X3.  Speech normal, gait appropriate for age and unassisted Psych--  Cognition and judgment appear intact.    Cooperative with normal attention span and concentration.  Behavior appropriate. No anxious or depressed appearing.      Assessment & Plan:  Assessment Diabetes 2014 HTN Hyperlipidemia Anxiety DJD GERD Osteopenia; dexa 07-2016 wnl  Ao [place] (-)  AAA 2010  PLAN:  HTN: Seems controlled, continue Zestoretic. Check a BMP GERD: Taking Protonix every other day, sxs relatively well controlled, does not like to step up the therapy. R arm pain: Since the last visit, took prednisone, no much help; currently on meloxicam and  Hydrocodone (qhs only d/t drowsiness). s/p eval by  sports medicine, sx felt to be a radiculopathy, on PT, second PT visit today. Patient still hurting, will add gabapentin 300 mg twice a day, see instructions, side effects discussed. Will notify sports medicine of gaba rx RTC 4 months

## 2017-03-04 NOTE — Patient Instructions (Signed)

## 2017-03-04 NOTE — Therapy (Signed)
[facility] [address]  [place], [place], [address] Phone: [phone]   Fax:  [phone]  Physical Therapy Treatment  Patient Details  Name: Monique Bauer MRN: [id number] Date of Birth: 25-Dec-1935 Referring Provider: Karlton Lemon, MD  Encounter Date: 03/04/2017      PT End of Session - 03/04/17 1532    Visit Number 2   Number of Visits 12   Date for PT Re-Evaluation 04/16/17   Authorization Type UHC Medicare [place]: [place]   PT Start Time 1532   PT Stop Time 1615   PT Time Calculation (min) 43 min   Activity Tolerance Patient tolerated treatment well   Behavior During Therapy [facility] for tasks assessed/performed      Past Medical History:  Diagnosis Date  . Anxiety   . Diabetes ([place]) 12-2012  . GERD (gastroesophageal reflux disease)   . Hyperlipidemia   . Hypertension   . Osteoarthritis   . Osteopenia   . Screening for AAA (abdominal aortic aneurysm) 11/2009   aorta U/S, no AAA    Past Surgical History:  Procedure Laterality Date  . NO PAST SURGERIES      There were no vitals filed for this visit.      Subjective Assessment - 03/04/17 1541    Subjective Pt reports pain was really bad this morning (9.5/10), probably the worst it's ever been. Thinks it may be due to the way she slept. Saw Dr. Larose Kells today who is starting her on Gabapentin.   Pertinent History DM, OA, osteopenia   Diagnostic tests n/a   Patient Stated Goals "pain-free"   Currently in Pain? No/denies   Pain Score 0-No pain   Pain Onset More than a month ago  ~6 wks                         [facility] Adult PT Treatment/Exercise - 03/04/17 1532      Neck Exercises: Machines for Strengthening   UBE (Upper Arm Bike) lvl 1.0 fwd/back x 4' each     Neck Exercises: Theraband   Shoulder Extension 10 reps;Red   Shoulder Extension Limitations standing   Rows 10 reps;Red   Rows Limitations standing   Shoulder External  Rotation 10 reps  yellow   Shoulder External Rotation Limitations hooklying on pool noodle   Horizontal ABduction 10 reps  yellow   Horizontal ABduction Limitations hooklying on pool noodle     Neck Exercises: Seated   Neck Retraction 10 reps  5" hold   Other Seated Exercise B scapular retraction 10x5"     Neck Exercises: Supine   Shoulder Flexion Both;10 reps;Weights   Shoulder Flexion Weights (lbs) 2# - hands on either end of db   Shoulder Flexion Limitations hooklying on pool noodle     Neck Exercises: Stretches   Other Neck Stretches Hooklying pec stretch over pool noodle x60"                PT Education - 03/04/17 1615    Education provided Yes   Education Details Posture & body mechanics education   Person(s) Educated Patient   Methods Explanation;Demonstration;Handout   Comprehension Verbalized understanding;Need further instruction          PT Short Term Goals - 03/01/17 1105      PT SHORT TERM GOAL #1   Title Independent with initial HEP by 03/15/17   Status New     PT  SHORT TERM GOAL #2   Title Pt will verbalize understanding of neutral spine and shoulder posture by 03/15/17   Status New           PT Long Term Goals - 03/01/17 1106      PT LONG TERM GOAL #1   Title Independent with advanced HEP + gym program as indicated by 04/16/17   Status New     PT LONG TERM GOAL #2   Title Cervical ROM WFL w/o pain by 04/16/17   Status New     PT LONG TERM GOAL #3   Title R shoulder ROM w/in 5 dg of L w/o increased pain by 04/16/17   Status New     PT LONG TERM GOAL #4   Title R shoulder strength >/=4+/5 by 04/16/17   Status New     PT LONG TERM GOAL #5   Title Pt will report no limitation in daily chores, driving or leisure activities (knitting/crochet) due to neck or R UE pain by 04/16/17               Plan - 03/04/17 1615    Clinical Impression Statement Pt reporting benefit from postural exercises with relief of pain noted when performing  them during choir at church. Did report significantly increased pain upon rising from sleeping this morning, which she thinks may be related to sleeping posture, therefore reviewed posture and body mechanics education for sleeping positions as well as typical daily chores. Introduced postional stretching and scapular stabilization/strengthening exercises with good pt tolerance and will consider updating HEP at next visit if continued positive response. MD to have pt start gabapentin for nerve pain as of today   Rehab Potential Good   Clinical Impairments Affecting Rehab Potential DM, OA, osteopenia   PT Treatment/Interventions Patient/family education;Neuromuscular re-education;Therapeutic exercise;Therapeutic activities;Functional mobility training;Manual techniques;Taping;Dry needling;Traction;Moist Heat;Electrical Stimulation;Ultrasound;Cryotherapy;Iontophoresis 4mg /ml Dexamethasone;ADLs/Self Care Home Management   PT Next Visit Plan Address and questions re: Posture & body mechanics education; Postural strengthening - scapular stabilization + anterior stretching; Manual therapy including manual traction - consider mechanical traction if continued benefit noted; Modalities PRN   Consulted and Agree with Plan of Care Patient      Patient will benefit from skilled therapeutic intervention in order to improve the following deficits and impairments:  Pain, Decreased range of motion, Impaired flexibility, Decreased strength, Postural dysfunction, Improper body mechanics, Impaired UE functional use  Visit Diagnosis: Radiculopathy, cervical region  Pain in right arm  Cervicalgia  Abnormal posture     Problem List Patient Active Problem List   Diagnosis Date Noted  . Cervical disc disorder with radiculopathy of cervical region 03/02/2017  . PCP notes ------------------------- 08/28/2015  . Diabetes ([place]) 04/11/2013  . Annual physical exam 10/16/2011  . Anxiety-insomnia   . OSTEOPENIA  11/09/2008  . OSTEOARTHRITIS 09/26/2008  . Dyslipidemia 10/04/2007  . HTN (hypertension) 10/04/2007  . ALLERGIC RHINITIS 10/04/2007  . GERD 10/04/2007    Percival Spanish, PT, MPT 03/04/2017, 7:01 PM  [facility] [address]  [place], [place], [address] Phone: [phone]   Fax:  [phone]  Name: Monique Bauer MRN: [id number] Date of Birth: 10-31-1936

## 2017-03-04 NOTE — Patient Instructions (Signed)
GO TO THE LAB : Get the blood work     GO TO THE FRONT DESK Schedule your next appointment for a  checkup in 4 months  Gabapentin 300 mg: The first week take only 1 tablet at night to be sure your tolerate it. Watch for excessive drowsiness. After a week increase it to twice a day If you think you need a higher dose please call.

## 2017-03-09 ENCOUNTER — Ambulatory Visit: Payer: Medicare Other | Admitting: Physical Therapy

## 2017-03-09 DIAGNOSIS — M5412 Radiculopathy, cervical region: Secondary | ICD-10-CM

## 2017-03-09 DIAGNOSIS — M79601 Pain in right arm: Secondary | ICD-10-CM

## 2017-03-09 DIAGNOSIS — M542 Cervicalgia: Secondary | ICD-10-CM

## 2017-03-09 DIAGNOSIS — R293 Abnormal posture: Secondary | ICD-10-CM

## 2017-03-09 NOTE — Therapy (Signed)
[facility] [address]  [place], [place], [address] Phone: [phone]   Fax:  [phone]  Physical Therapy Treatment  Patient Details  Name: Aitana Burry Westra MRN: [id number] Date of Birth: 1936-05-02 Referring Provider: Karlton Lemon, MD  Encounter Date: 03/09/2017      PT End of Session - 03/09/17 0808    Visit Number 3   Number of Visits 12   Date for PT Re-Evaluation 04/16/17   Authorization Type UHC Medicare [place]   PT Start Time 0803   PT Stop Time 0859   PT Time Calculation (min) 56 min   Activity Tolerance Patient tolerated treatment well   Behavior During Therapy [facility] for tasks assessed/performed      Past Medical History:  Diagnosis Date  . Anxiety   . Diabetes ([place]) 12-2012  . GERD (gastroesophageal reflux disease)   . Hyperlipidemia   . Hypertension   . Osteoarthritis   . Osteopenia   . Screening for AAA (abdominal aortic aneurysm) 11/2009   aorta U/S, no AAA    Past Surgical History:  Procedure Laterality Date  . NO PAST SURGERIES      There were no vitals filed for this visit.      Subjective Assessment - 03/09/17 0806    Subjective Pt reports pain was really bad this morning (9.5/10), probably the worst it's ever been. Thinks it may be due to the way she slept. Saw Dr. Larose Kells today who is starting her on Gabapentin.   Pertinent History DM, OA, osteopenia   Diagnostic tests n/a   Patient Stated Goals "pain-free"   Currently in Pain? Yes   Pain Score 4    Pain Location Arm  wrist & upper arm   Pain Orientation Right   Pain Descriptors / Indicators Sharp  "sensitive"   Pain Type Acute pain   Pain Onset More than a month ago  ~6 wks   Pain Frequency Intermittent                         [facility] Adult PT Treatment/Exercise - 03/09/17 0803      Neck Exercises: Machines for Strengthening   UBE (Upper Arm Bike) lvl 2.0 fwd/back x 3' each     Neck Exercises:  Theraband   Shoulder External Rotation 15 reps;Red   Shoulder External Rotation Limitations hooklying on pool noodle   Horizontal ABduction 15 reps;Red   Horizontal ABduction Limitations hooklying on pool noodle   Other Theraband Exercises Alt shoulder flex + opp shoulder ext with red TB 15x3" (hooklying on pool noodle)     Modalities   Modalities Traction     Traction   Type of Traction Cervical   Min (lbs) 6   Max (lbs) 12   Hold Time 60   Rest Time 20   Time 10     Manual Therapy   Manual Therapy Soft tissue mobilization;Myofascial release;Manual Traction   Manual therapy comments pt supine   Soft tissue mobilization B UT, LS, scalenes & pecs   Myofascial Release TPR to R UT & LS   Manual Traction Cervical distraction 10x30" - pt noting relief of radicular symptoms     Neck Exercises: Stretches   Upper Trapezius Stretch 30 seconds;2 reps   Levator Stretch 30 seconds;2 reps   Other Neck Stretches Hooklying pec stretch over pool noodle x60"  PT Education - 03/09/17 0849    Education provided Yes   Education Details HEP update - UT & LS stretches; role of traction   Person(s) Educated Patient   Methods Explanation;Demonstration;Handout   Comprehension Verbalized understanding;Returned demonstration;Need further instruction          PT Short Term Goals - 03/09/17 0808      PT SHORT TERM GOAL #1   Title Independent with initial HEP by 03/15/17   Status On-going     PT SHORT TERM GOAL #2   Title Pt will verbalize understanding of neutral spine and shoulder posture by 03/15/17   Status On-going           PT Long Term Goals - 03/09/17 0808      PT LONG TERM GOAL #1   Title Independent with advanced HEP + gym program as indicated by 04/16/17   Status On-going     PT LONG TERM GOAL #2   Title Cervical ROM WFL w/o pain by 04/16/17   Status On-going     PT LONG TERM GOAL #3   Title R shoulder ROM w/in 5 dg of L w/o increased pain by 04/16/17    Status On-going     PT LONG TERM GOAL #4   Title R shoulder strength >/=4+/5 by 04/16/17   Status On-going     PT LONG TERM GOAL #5   Title Pt will report no limitation in daily chores, driving or leisure activities (knitting/crochet) due to neck or R UE pain by 04/16/17   Status On-going               Plan - 03/09/17 0808    Clinical Impression Statement Pt denies any questions or concerns re: postural & body mechanics education, and notes that postural awareness has helped reduce her pain and radicular symptoms. Palpation today revealed increased muscle tension in R UT & LS along with B pecs as well as TP's in R UT & LS, therefore manual therapy focused on reducing muscle tension followed by instruction in UT & LS stretches for home. Pt noting relief of radicular symptoms with manual tracation, therefore treatment concluded with trial of mechanical traction.   Rehab Potential Good   Clinical Impairments Affecting Rehab Potential DM, OA, osteopenia   PT Treatment/Interventions Patient/family education;Neuromuscular re-education;Therapeutic exercise;Therapeutic activities;Functional mobility training;Manual techniques;Taping;Dry needling;Traction;Moist Heat;Electrical Stimulation;Ultrasound;Cryotherapy;Iontophoresis 4mg /ml Dexamethasone;ADLs/Self Care Home Management   PT Next Visit Plan Assess response to mechanical traction; Postural strengthening - scapular stabilization + anterior stretching; Manual therapy including manual traction; Continue mechanical traction if benefit noted; Modalities PRN   Consulted and Agree with Plan of Care Patient      Patient will benefit from skilled therapeutic intervention in order to improve the following deficits and impairments:  Pain, Decreased range of motion, Impaired flexibility, Decreased strength, Postural dysfunction, Improper body mechanics, Impaired UE functional use  Visit Diagnosis: Radiculopathy, cervical region  Pain in right  arm  Cervicalgia  Abnormal posture     Problem List Patient Active Problem List   Diagnosis Date Noted  . Cervical disc disorder with radiculopathy of cervical region 03/02/2017  . PCP notes ------------------------- 08/28/2015  . Diabetes ([place]) 04/11/2013  . Annual physical exam 10/16/2011  . Anxiety-insomnia   . OSTEOPENIA 11/09/2008  . OSTEOARTHRITIS 09/26/2008  . Dyslipidemia 10/04/2007  . HTN (hypertension) 10/04/2007  . ALLERGIC RHINITIS 10/04/2007  . GERD 10/04/2007    Percival Spanish, PT, MPT 03/09/2017, 9:01 AM  [facility] [address]  [place], [place], [address] Phone: [phone]   Fax:  [phone]  Name: ERVA KOKE MRN: [id number] Date of Birth: 1936-11-29

## 2017-03-12 ENCOUNTER — Ambulatory Visit: Payer: Medicare Other

## 2017-03-12 DIAGNOSIS — M542 Cervicalgia: Secondary | ICD-10-CM

## 2017-03-12 DIAGNOSIS — R293 Abnormal posture: Secondary | ICD-10-CM

## 2017-03-12 DIAGNOSIS — M5412 Radiculopathy, cervical region: Secondary | ICD-10-CM | POA: Diagnosis not present

## 2017-03-12 DIAGNOSIS — M79601 Pain in right arm: Secondary | ICD-10-CM

## 2017-03-12 NOTE — Therapy (Signed)
[facility] [address]  [place], [place], [address] Phone: [phone]   Fax:  [phone]  Physical Therapy Treatment  Patient Details  Name: Monique Bauer MRN: [id number] Date of Birth: [birth year] Referring Provider: Karlton Lemon, MD  Encounter Date: 03/12/2017      PT End of Session - 03/12/17 0851    Visit Number 4   Number of Visits 12   Date for PT Re-Evaluation 04/16/17   Authorization Type UHC [place]   PT Start Time 0846   PT Stop Time 0940   PT Time Calculation (min) 54 min   Activity Tolerance Patient tolerated treatment well   Behavior During Therapy [facility] for tasks assessed/performed      Past Medical History:  Diagnosis Date  . Anxiety   . Diabetes ([place]) 12-2012  . GERD (gastroesophageal reflux disease)   . Hyperlipidemia   . Hypertension   . Osteoarthritis   . Osteopenia   . Screening for AAA (abdominal aortic aneurysm) 11/2009   aorta U/S, no AAA    Past Surgical History:  Procedure Laterality Date  . NO PAST SURGERIES      There were no vitals filed for this visit.      Subjective Assessment - 03/12/17 0849    Subjective Pt. reporting pain was worse this morning however has improved since this point and is much reduced as compared to last treatment.  Notes all day benefit from traction.     Patient Stated Goals "pain-free"   Currently in Pain? Yes   Pain Score 1    Pain Location Arm   Pain Orientation Right   Pain Type Acute pain   Pain Radiating Towards originating at wrist and extendin up arm to mid upper arm   Pain Onset More than a month ago   Pain Frequency Intermittent   Multiple Pain Sites No                         [facility] Adult PT Treatment/Exercise - 03/12/17 0905      Neck Exercises: Machines for Strengthening   Other Machines for Strengthening NuStep: level 3; 6 min; limited availability of UBE      Neck Exercises: Theraband    Shoulder External Rotation Red;20 reps   Shoulder External Rotation Limitations hooklying on 1/2 foam bolster    Horizontal ABduction Red;20 reps   Horizontal ABduction Limitations hooklying on 1/2 foam bolster    Other Theraband Exercises Alt shoulder flex + opp shoulder ext with red TB 20 x 3" (hooklying on pool noodle)     Traction   Type of Traction Cervical   Min (lbs) 7   Max (lbs) 13   Hold Time 60   Rest Time 20   Time 10     Manual Therapy   Manual Therapy Soft tissue mobilization;Myofascial release;Passive ROM   Manual therapy comments pt supine   Soft tissue mobilization B UT, LS, scalenes & pecs   Myofascial Release TPR to R UT & LS   Passive ROM gentle overpressure from therapist into pec stretch laying on 1/2 foam bolster     Neck Exercises: Stretches   Upper Trapezius Stretch 30 seconds;2 reps   Levator Stretch 30 seconds;2 reps   Corner Stretch 30 seconds;2 reps   Corner Stretch Limitations 3-way doorway stretch - low/mid/high   Other Neck Stretches Hooklying pec stretch over pool noodle x60"  PT Short Term Goals - 03/12/17 0086      PT SHORT TERM GOAL #1   Title Independent with initial HEP by 03/15/17   Status Achieved     PT SHORT TERM GOAL #2   Title Pt will verbalize understanding of neutral spine and shoulder posture by 03/15/17   Status Achieved           PT Long Term Goals - 03/09/17 0808      PT LONG TERM GOAL #1   Title Independent with advanced HEP + gym program as indicated by 04/16/17   Status On-going     PT LONG TERM GOAL #2   Title Cervical ROM WFL w/o pain by 04/16/17   Status On-going     PT LONG TERM GOAL #3   Title R shoulder ROM w/in 5 dg of L w/o increased pain by 04/16/17   Status On-going     PT LONG TERM GOAL #4   Title R shoulder strength >/=4+/5 by 04/16/17   Status On-going     PT LONG TERM GOAL #5   Title Pt will report no limitation in daily chores, driving or leisure activities  (knitting/crochet) due to neck or R UE pain by 04/16/17   Status On-going               Plan - 03/12/17 0923    Clinical Impression Statement Pt. reporting much improved pain level today noting benefit from mechanical traction performed last treatment.  Gentle scapular strengthening progressed today lying on 1/2 foam bolster and tolerated well.  STM/TPR to UT, Levator Scap. areas continued today with good relief noted.  Mild progression in cervical mechanical traction to end treatment to 13#/7#.  Pt. pain free following treatment.     PT Treatment/Interventions Patient/family education;Neuromuscular re-education;Therapeutic exercise;Therapeutic activities;Functional mobility training;Manual techniques;Taping;Dry needling;Traction;Moist Heat;Electrical Stimulation;Ultrasound;Cryotherapy;Iontophoresis 4mg /ml Dexamethasone;ADLs/Self Care Home Management   PT Next Visit Plan Postural strengthening - scapular stabilization + anterior stretching; Manual therapy including manual traction; Continue mechanical traction if benefit noted; Modalities PRN      Patient will benefit from skilled therapeutic intervention in order to improve the following deficits and impairments:  Pain, Decreased range of motion, Impaired flexibility, Decreased strength, Postural dysfunction, Improper body mechanics, Impaired UE functional use  Visit Diagnosis: Radiculopathy, cervical region  Pain in right arm  Cervicalgia  Abnormal posture     Problem List Patient Active Problem List   Diagnosis Date Noted  . Cervical disc disorder with radiculopathy of cervical region 03/02/2017  . PCP notes ------------------------- 08/28/2015  . Diabetes ([place]) 04/11/2013  . Annual physical exam 10/16/2011  . Anxiety-insomnia   . OSTEOPENIA 11/09/2008  . OSTEOARTHRITIS 09/26/2008  . Dyslipidemia 10/04/2007  . HTN (hypertension) 10/04/2007  . ALLERGIC RHINITIS 10/04/2007  . GERD 10/04/2007    Bess Harvest,  PTA 03/12/17 10:56 AM  [facility] [address]  [place], [place], [address] Phone: [phone]   Fax:  [phone]  Name: Monique Bauer MRN: [id number] Date of Birth: [birth year]

## 2017-03-15 ENCOUNTER — Ambulatory Visit: Payer: Medicare Other | Attending: Family Medicine

## 2017-03-15 DIAGNOSIS — M542 Cervicalgia: Secondary | ICD-10-CM | POA: Diagnosis present

## 2017-03-15 DIAGNOSIS — R293 Abnormal posture: Secondary | ICD-10-CM

## 2017-03-15 DIAGNOSIS — M5412 Radiculopathy, cervical region: Secondary | ICD-10-CM

## 2017-03-15 DIAGNOSIS — M79601 Pain in right arm: Secondary | ICD-10-CM | POA: Diagnosis present

## 2017-03-15 NOTE — Therapy (Signed)
[facility] [address]  [place], [place], [address] Phone: [phone]   Fax:  [phone]  Physical Therapy Treatment  Patient Details  Name: Monique Bauer MRN: [id number] Date of Birth: Jan 29, 1936 Referring Provider: Karlton Lemon, MD  Encounter Date: 03/15/2017      PT End of Session - 03/15/17 1020    Visit Number 5   Number of Visits 12   Date for PT Re-Evaluation 04/16/17   Authorization Type UHC Medicare - [place]: [place]   PT Start Time 1016   PT Stop Time 1107   PT Time Calculation (min) 51 min   Activity Tolerance Patient tolerated treatment well   Behavior During Therapy [facility] for tasks assessed/performed      Past Medical History:  Diagnosis Date  . Anxiety   . Diabetes ([place]) 12-2012  . GERD (gastroesophageal reflux disease)   . Hyperlipidemia   . Hypertension   . Osteoarthritis   . Osteopenia   . Screening for AAA (abdominal aortic aneurysm) 11/2009   aorta U/S, no AAA    Past Surgical History:  Procedure Laterality Date  . NO PAST SURGERIES      There were no vitals filed for this visit.      Subjective Assessment - 03/15/17 1019    Subjective Pt. reporting R UE pain without known trigger yesterday.  Pt. pain free today.  Noting benefit from mechanical traction.     Patient Stated Goals "pain-free"   Currently in Pain? No/denies   Pain Score 0-No pain   Multiple Pain Sites No                         [facility] Adult PT Treatment/Exercise - 03/15/17 1021      Neck Exercises: Machines for Strengthening   UBE (Upper Arm Bike) lvl 2.5 fwd/back x 3' each     Neck Exercises: Theraband   Shoulder Extension Red;15 reps   Shoulder Extension Limitations standing   Rows Red;15 reps   Rows Limitations standing   Shoulder External Rotation 20 reps;Red   Shoulder External Rotation Limitations hooklying on 1/2 foam bolster    Horizontal ABduction Green;15 reps   Horizontal  ABduction Limitations hooklying on 1/2 foam bolster    Other Theraband Exercises Alt shoulder flex + opp shoulder ext with red TB 20 x 3" (hooklying on pool noodle)     Shoulder Exercises: Standing   External Rotation AROM;Right;10 reps;Theraband;Strengthening   Theraband Level (Shoulder External Rotation) Level 2 (Red)   External Rotation Limitations with towel    Internal Rotation AROM;Right;Strengthening;Theraband   Theraband Level (Shoulder Internal Rotation) Level 2 (Red)   Internal Rotation Limitations with towel    Flexion AROM;Strengthening;15 reps;Both   Shoulder Flexion Weight (lbs) leaning on 1/2 bolster    ABduction AROM;Both;Strengthening;10 reps   Shoulder ABduction Weight (lbs) leaning on 1/2 foam bolster      Traction   Type of Traction Cervical   Min (lbs) 8   Max (lbs) 14   Hold Time 60   Rest Time 20   Time 12     Manual Therapy   Manual Therapy Soft tissue mobilization;Passive ROM   Manual therapy comments pt supine   Soft tissue mobilization B UT, LS   Passive ROM Gentle stretching B UT, Levater scap.                   PT Short Term Goals -  03/12/17 1610      PT SHORT TERM GOAL #1   Title Independent with initial HEP by 03/15/17   Status Achieved     PT SHORT TERM GOAL #2   Title Pt will verbalize understanding of neutral spine and shoulder posture by 03/15/17   Status Achieved           PT Long Term Goals - 03/09/17 0808      PT LONG TERM GOAL #1   Title Independent with advanced HEP + gym program as indicated by 04/16/17   Status On-going     PT LONG TERM GOAL #2   Title Cervical ROM WFL w/o pain by 04/16/17   Status On-going     PT LONG TERM GOAL #3   Title R shoulder ROM w/in 5 dg of L w/o increased pain by 04/16/17   Status On-going     PT LONG TERM GOAL #4   Title R shoulder strength >/=4+/5 by 04/16/17   Status On-going     PT LONG TERM GOAL #5   Title Pt will report no limitation in daily chores, driving or leisure activities  (knitting/crochet) due to neck or R UE pain by 04/16/17   Status On-going               Plan - 03/15/17 1024    Clinical Impression Statement Pt. still noting benefit from mechanical traction today thus traction progressed to 14#/8#.  Today's treatment with mild progression of scapular strengthening and anterior chest stretching activities.  Time spent with R shoulder strengthening activity today with pt. tolerating well.  Manual stretching to cervical musculature continued today. Pt. progressing well at this point.     PT Treatment/Interventions Patient/family education;Neuromuscular re-education;Therapeutic exercise;Therapeutic activities;Functional mobility training;Manual techniques;Taping;Dry needling;Traction;Moist Heat;Electrical Stimulation;Ultrasound;Cryotherapy;Iontophoresis 4mg /ml Dexamethasone;ADLs/Self Care Home Management   PT Next Visit Plan Postural strengthening - scapular stabilization + anterior stretching; Manual therapy including manual traction; Continue mechanical traction if benefit noted; Modalities PRN      Patient will benefit from skilled therapeutic intervention in order to improve the following deficits and impairments:  Pain, Decreased range of motion, Impaired flexibility, Decreased strength, Postural dysfunction, Improper body mechanics, Impaired UE functional use  Visit Diagnosis: Radiculopathy, cervical region  Pain in right arm  Cervicalgia  Abnormal posture     Problem List Patient Active Problem List   Diagnosis Date Noted  . Cervical disc disorder with radiculopathy of cervical region 03/02/2017  . PCP notes ------------------------- 08/28/2015  . Diabetes ([place]) 04/11/2013  . Annual physical exam 10/16/2011  . Anxiety-insomnia   . OSTEOPENIA 11/09/2008  . OSTEOARTHRITIS 09/26/2008  . Dyslipidemia 10/04/2007  . HTN (hypertension) 10/04/2007  . ALLERGIC RHINITIS 10/04/2007  . GERD 10/04/2007    Bess Harvest, PTA 03/15/17 1:51  PM  [facility] [address]  [place], [place], [address] Phone: [phone]   Fax:  [phone]  Name: Monique Bauer MRN: [id number] Date of Birth: Dec 22, 1935

## 2017-03-18 ENCOUNTER — Ambulatory Visit: Payer: Medicare Other

## 2017-03-18 DIAGNOSIS — M5412 Radiculopathy, cervical region: Secondary | ICD-10-CM | POA: Diagnosis not present

## 2017-03-18 DIAGNOSIS — M542 Cervicalgia: Secondary | ICD-10-CM

## 2017-03-18 DIAGNOSIS — R293 Abnormal posture: Secondary | ICD-10-CM

## 2017-03-18 DIAGNOSIS — M79601 Pain in right arm: Secondary | ICD-10-CM
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
## 2017-03-22 ENCOUNTER — Ambulatory Visit: Payer: Medicare Other

## 2017-03-22 DIAGNOSIS — R293 Abnormal posture: Secondary | ICD-10-CM

## 2017-03-22 DIAGNOSIS — M5412 Radiculopathy, cervical region: Secondary | ICD-10-CM

## 2017-03-22 DIAGNOSIS — M79601 Pain in right arm: Secondary | ICD-10-CM

## 2017-03-22 DIAGNOSIS — M542 Cervicalgia: Secondary | ICD-10-CM
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
## 2017-03-25 ENCOUNTER — Ambulatory Visit (INDEPENDENT_AMBULATORY_CARE_PROVIDER_SITE_OTHER): Payer: Medicare Other | Admitting: Family Medicine

## 2017-03-25 ENCOUNTER — Encounter: Payer: Self-pay | Admitting: Family Medicine

## 2017-03-25 ENCOUNTER — Ambulatory Visit: Payer: Medicare Other

## 2017-03-25 DIAGNOSIS — M5412 Radiculopathy, cervical region: Secondary | ICD-10-CM | POA: Diagnosis not present

## 2017-03-25 DIAGNOSIS — M542 Cervicalgia: Secondary | ICD-10-CM

## 2017-03-25 DIAGNOSIS — M5010 Cervical disc disorder with radiculopathy, unspecified cervical region: Secondary | ICD-10-CM

## 2017-03-25 DIAGNOSIS — M79601 Pain in right arm: Secondary | ICD-10-CM

## 2017-03-25 DIAGNOSIS — R293 Abnormal posture: Secondary | ICD-10-CM

## 2017-03-25 NOTE — Patient Instructions (Signed)
You have cervical radiculopathy (a pinched nerve in the neck). Aleve 2 tabs twice a day with food for pain and inflammation as needed. Continue with the physical therapy. Consider home traction unit if you're benefiting with this in therapy. Heat 15 minutes at a time 3-4 times a day to help with spasms. Watch head position when on computers, texting, when sleeping in bed - should in line with back to prevent further nerve traction and irritation. If not improving we will consider an MRI. Follow up with me in 6 weeks.

## 2017-03-25 NOTE — Therapy (Addendum)
[facility] [address]  [place], [place], [address] Phone: [phone]   Fax:  [phone]  Physical Therapy Treatment  Patient Details  Name: Monique Bauer MRN: [id number] Date of Birth: 10-Feb-1936 Referring Provider: Karlton Lemon, MD  Encounter Date: 03/25/2017      PT End of Session - 03/25/17 0944    Visit Number 8   Number of Visits 12   Date for PT Re-Evaluation 04/16/17   Authorization Type UHC Medicare - [place]: [place]   PT Start Time 0940  pt. arriving late due to MD f/u    PT Stop Time 1040   PT Time Calculation (min) 60 min   Activity Tolerance Patient tolerated treatment well   Behavior During Therapy [facility] for tasks assessed/performed      Past Medical History:  Diagnosis Date  . Anxiety   . Diabetes ([place]) 12-2012  . GERD (gastroesophageal reflux disease)   . Hyperlipidemia   . Hypertension   . Osteoarthritis   . Osteopenia   . Screening for AAA (abdominal aortic aneurysm) 11/2009   aorta U/S, no AAA    Past Surgical History:  Procedure Laterality Date  . NO PAST SURGERIES      There were no vitals filed for this visit.      Subjective Assessment - 03/25/17 0943    Subjective Pt. reporting MD f/u went well with MD pleased with pt. progress.  Pt. feeling, "better than she has felt since starting therapy", today.     Patient Stated Goals "pain-free"   Currently in Pain? No/denies   Pain Score 0-No pain  pain still up to 5/10 when pushing off seat to stance up   Multiple Pain Sites No                         [facility] Adult PT Treatment/Exercise - 03/25/17 0949      Neck Exercises: Theraband   Shoulder Extension Red;15 reps  HEP review; cues still required for scap. squeeze    Shoulder Extension Limitations standing   Rows 10 reps;Green  3" hold; HEP check; cues still required for scap. squeeze    Rows Limitations standing   Shoulder External Rotation 20 reps;Red    Shoulder External Rotation Limitations hooklying on 1/2 foam bolster    Horizontal ABduction 20 reps;Green   Horizontal ABduction Limitations hooklying on 1/2 foam bolster      Shoulder Exercises: Standing   External Rotation AROM;Right;Theraband;Strengthening;15 reps   Theraband Level (Shoulder External Rotation) Level 2 (Red)   External Rotation Limitations cues for scap. squeeze; towel roll   Internal Rotation AROM;Right;Strengthening;Theraband   Theraband Level (Shoulder Internal Rotation) Level 2 (Red)   Internal Rotation Limitations with towel      Traction   Type of Traction Cervical   Min (lbs) 10   Max (lbs) 16   Hold Time 60   Rest Time 20   Time 14     Manual Therapy   Manual Therapy Soft tissue mobilization;Passive ROM   Manual therapy comments pt supine   Soft tissue mobilization B UT, LS, medial scapular border; no significant TTP or indication for TP's    Passive ROM Gentle stretching B UT, Levater scap.   Moist heat used during stretches    Manual Traction Manual cervical traction with B UT, Levator scap. stretches and neutral x 2 min; Still noting relief from manual traction  PT Education - 03/25/17 1257    Education provided Yes   Education Details IR, ER with red TB issued to pt.    Person(s) Educated Patient   Methods Explanation;Demonstration;Verbal cues;Handout;Tactile cues   Comprehension Verbalized understanding;Returned demonstration;Verbal cues required;Tactile cues required;Need further instruction          PT Short Term Goals - 03/12/17 1941      PT SHORT TERM GOAL #1   Title Independent with initial HEP by 03/15/17   Status Achieved     PT SHORT TERM GOAL #2   Title Pt will verbalize understanding of neutral spine and shoulder posture by 03/15/17   Status Achieved           PT Long Term Goals - 03/22/17 1546      PT LONG TERM GOAL #1   Title Independent with advanced HEP + gym program as indicated by 04/16/17    Status On-going     PT LONG TERM GOAL #2   Title Cervical ROM WFL w/o pain by 04/16/17   Status On-going     PT LONG TERM GOAL #3   Title R shoulder ROM w/in 5 dg of L w/o increased pain by 04/16/17   Status Partially Met  4.9.18: only met for IR     PT LONG TERM GOAL #4   Title R shoulder strength >/=4+/5 by 04/16/17   Status Partially Met  4.9.18: R shoulder abduction, IR, and ER still 4/5.  Met for flexion 4+/5.     PT LONG TERM GOAL #5   Title Pt will report no limitation in daily chores, driving or leisure activities (knitting/crochet) due to neck or R UE pain by 04/16/17   Status On-going  4.9.18: Pt. still avoids picking up heavy objects and has not yet returned to knitting/crochet. due to neck and R UE pain.                Plan - 03/25/17 0945    Clinical Impression Statement Pt. reporting MD pleased with progress with f/u earlier today.  Pt. noting she feels better today than any day since starting therapy now able to lift bird feeder overhead without pain which she was not able to do previously.  Pt. noting benefit from traction thus progressed to 10#/16#.  Still some cueing required for full scap. squeeze with updated HEP however pt. tolerating well.  Still some weakness at R shoulder thus IR/ER with red TB issued to pt. via handout.  Pt. able to perform IR/ER with only min cueing in treatment today.  Pt progressing well noting improved cervical rotation to end treatment today.     PT Treatment/Interventions Patient/family education;Neuromuscular re-education;Therapeutic exercise;Therapeutic activities;Functional mobility training;Manual techniques;Taping;Dry needling;Traction;Moist Heat;Electrical Stimulation;Ultrasound;Cryotherapy;Iontophoresis 71m/ml Dexamethasone;ADLs/Self Care Home Management   PT Next Visit Plan Monitor response to updated HEP; Postural strengthening - scapular stabilization + anterior stretching; Manual therapy including manual traction; Continue mechanical  traction if benefit noted; Modalities PRN      Patient will benefit from skilled therapeutic intervention in order to improve the following deficits and impairments:  Pain, Decreased range of motion, Impaired flexibility, Decreased strength, Postural dysfunction, Improper body mechanics, Impaired UE functional use  Visit Diagnosis: Radiculopathy, cervical region  Pain in right arm  Cervicalgia  Abnormal posture     Problem List Patient Active Problem List   Diagnosis Date Noted  . Cervical disc disorder with radiculopathy of cervical region 03/02/2017  . PCP notes ------------------------- 08/28/2015  . Diabetes ([place] 04/11/2013  .  Annual physical exam 10/16/2011  . Anxiety-insomnia   . OSTEOPENIA 11/09/2008  . OSTEOARTHRITIS 09/26/2008  . Dyslipidemia 10/04/2007  . HTN (hypertension) 10/04/2007  . ALLERGIC RHINITIS 10/04/2007  . GERD 10/04/2007    Bess Harvest, PTA 03/25/17 12:57 PM  [facility] [address]  [place], [place], [address] Phone: [phone]   Fax:  [phone]  Name: Monique Bauer MRN: [id number] Date of Birth: 11-22-1936

## 2017-03-29 ENCOUNTER — Ambulatory Visit: Payer: Medicare Other

## 2017-03-29 DIAGNOSIS — R293 Abnormal posture: Secondary | ICD-10-CM

## 2017-03-29 DIAGNOSIS — M5412 Radiculopathy, cervical region: Secondary | ICD-10-CM | POA: Diagnosis not present

## 2017-03-29 DIAGNOSIS — M542 Cervicalgia: Secondary | ICD-10-CM

## 2017-03-29 DIAGNOSIS — M79601 Pain in right arm: Secondary | ICD-10-CM

## 2017-03-29 NOTE — Progress Notes (Signed)
PCP and consultation requested by: Kathlene November, MD  Subjective:   HPI: Patient is a 81 y.o. female here for right arm pain.  3/15: Patient denies known injury or trauma. She states about 1 month ago she started to get pain in right side of neck and arm. This has progressed to 5/10 level, sharp. Associated pain in right hand and feels very sensitive. No prior issues. No rash. Is right handed. Took prednisone which did help. Tried wrist braces but felt this irritated her more. No bowel/bladder dysfunction. No skin changes.  4/12: Patient reports she is improving. About 50% improved from last visit. Doing physical therapy, motion has improved. Taking gabapentin twice a day. Has a sharp pain that still comes on but goes away quickly. Pain with gripping, lying arm on something. No skin changes. Pain level 0/10 currently.  Past Medical History:  Diagnosis Date  . Anxiety   . Diabetes ([place]) 12-2012  . GERD (gastroesophageal reflux disease)   . Hyperlipidemia   . Hypertension   . Osteoarthritis   . Osteopenia   . Screening for AAA (abdominal aortic aneurysm) 11/2009   aorta U/S, no AAA    Current Outpatient Prescriptions on File Prior to Visit  Medication Sig Dispense Refill  . ALPRAZolam (XANAX) 0.25 MG tablet Take 1 tablet (0.25 mg total) by mouth 3 (three) times daily as needed for sleep. 60 tablet 2  . aspirin 81 MG tablet Take 81 mg by mouth daily.      . Calcium Carbonate-Vitamin D (CALCIUM + D PO) Take by mouth.      . escitalopram (LEXAPRO) 10 MG tablet Take 1 tablet (10 mg total) by mouth daily. 90 tablet 2  . gabapentin (NEURONTIN) 300 MG capsule Take 1 capsule (300 mg total) by mouth 2 (two) times daily. 60 capsule 3  . glucosamine-chondroitin 500-400 MG tablet Take 1 tablet by mouth 3 (three) times daily.      Marland Kitchen HYDROcodone-acetaminophen (NORCO/VICODIN) 5-325 MG tablet Take 1-2 tablets by mouth every 8 (eight) hours as needed. (Patient not taking: Reported on  03/01/2017) 30 tablet 0  . hydrocortisone cream 1 % Apply to affected area 2 times daily 15 g 0  . lisinopril-hydrochlorothiazide (PRINZIDE,ZESTORETIC) 10-12.5 MG tablet Take 1 tablet by mouth daily. 90 tablet 2  . meloxicam (MOBIC) 7.5 MG tablet Take 1 tablet (7.5 mg total) by mouth daily as needed. for pain 90 tablet 0  . metFORMIN (GLUCOPHAGE) 500 MG tablet Take 1 tablet (500 mg total) by mouth 2 (two) times daily with a meal. 180 tablet 2  . Multiple Vitamin (MULTIVITAMIN) capsule Take 1 capsule by mouth daily.      Glory Rosebush DELICA LANCETS FINE MISC Check blood sugars once  daily. (Patient not taking: Reported on 03/04/2017) 100 each 12  . ONETOUCH VERIO test strip Check blood sugars once  daily. (Patient not taking: Reported on 03/04/2017) 100 each 12  . pantoprazole (PROTONIX) 40 MG tablet Take 1 tablet (40 mg total) by mouth daily. (Patient taking differently: Take 40 mg by mouth every other day. ) 90 tablet 1  . simvastatin (ZOCOR) 80 MG tablet Take 1 tablet (80 mg total) by mouth at bedtime. 90 tablet 1   No current facility-administered medications on file prior to visit.     Past Surgical History:  Procedure Laterality Date  . NO PAST SURGERIES      Allergies  Allergen Reactions  . Sulfamethoxazole-Trimethoprim     REACTION: rash all over  Social History   Social History  . Marital status: Married    Spouse name: Coralee Pesa  . Number of children: 3  . Years of education: N/A   Occupational History  . retired    .  Retired   Social History Main Topics  . Smoking status: Former Smoker    Quit date: 10/15/1981  . Smokeless tobacco: Never Used     Comment: was a social smoker  . Alcohol use Yes     Comment: wine sometimes  . Drug use: No  . Sexual activity: No   Other Topics Concern  . Not on file   Social History Narrative    3 daughters live in [place], 5 G-children, 1 GG son, married x 3     Family History  Problem Relation Age of Onset  . Breast cancer Other      aunt   . Coronary artery disease Mother     M at age 42 and F  . Diabetes Mother     M, F, daughter, sister  . Hypertension Mother   . Stroke Father   . Asthma Sister   . Colon cancer Neg Hx     BP (!) 157/80   Pulse 73   Ht 5' (1.524 m)   Wt 139 lb (63 kg)   BMI 27.15 kg/m   Review of Systems: See HPI above.     Objective:  Physical Exam:  Gen: NAD, comfortable in exam room  Neck: No gross deformity, swelling, bruising. Minimal TTP right cervical paraspinal region.  No midline/bony TTP. 30 degrees bilateral lateral rotations with some pain.Marland Kitchen BUE strength 5/5.   Mildly diminished sensation in right thumb. 2+ equal reflexes in triceps, biceps, brachioradialis tendons. Negative spurlings.   Assessment & Plan:  1. Cervical radiculopathy - consistent with either C6 or C7 radiculopathy.  Clinically improving with PT, prednisone.  Continue PT.  Aleve if needed.  Heat fo spasms.  Discussed ergonomic considerations.  F/u in 6 weeks.

## 2017-03-29 NOTE — Therapy (Signed)
[facility] [address]  [place], [place], [address] Phone: [phone]   Fax:  [phone]  Physical Therapy Treatment  Patient Details  Name: Monique Bauer MRN: [id number] Date of Birth: 14-Sep-1936 Referring Provider: Karlton Lemon, MD  Encounter Date: 03/29/2017      PT End of Session - 03/29/17 1025    Visit Number 9   Number of Visits 12   Date for PT Re-Evaluation 04/16/17   Authorization Type UHC Medicare - [place]: [place]   PT Start Time 1020   PT Stop Time 1115   PT Time Calculation (min) 55 min   Activity Tolerance Patient tolerated treatment well   Behavior During Therapy [facility] for tasks assessed/performed      Past Medical History:  Diagnosis Date  . Anxiety   . Diabetes ([place]) 12-2012  . GERD (gastroesophageal reflux disease)   . Hyperlipidemia   . Hypertension   . Osteoarthritis   . Osteopenia   . Screening for AAA (abdominal aortic aneurysm) 11/2009   aorta U/S, no AAA    Past Surgical History:  Procedure Laterality Date  . NO PAST SURGERIES      There were no vitals filed for this visit.      Subjective Assessment - 03/29/17 1018    Subjective Pt. reporting she is still getting swollen in R wrist and had tingling in B hands.     Patient Stated Goals "pain-free"   Currently in Pain? Yes   Pain Score 2    Pain Location Arm   Pain Orientation Right;Lateral   Pain Descriptors / Indicators --  stinging    Pain Type Acute pain   Pain Relieving Factors heat    Effect of Pain on Daily Activities unable to mop longer than 10 min    Multiple Pain Sites No                         [facility] Adult PT Treatment/Exercise - 03/29/17 1032      Neck Exercises: Machines for Strengthening   UBE (Upper Arm Bike) lvl 3.0 fwd/back x 3' each     Neck Exercises: Theraband   Shoulder Extension Red;15 reps  3" hold    Shoulder Extension Limitations standing   Rows Green;15 reps  5"  hold    Rows Limitations standing   Shoulder External Rotation 20 reps;Red   Shoulder External Rotation Limitations leaning on doorseal      Shoulder Exercises: Standing   External Rotation AROM;Right;Theraband;Strengthening;15 reps   Theraband Level (Shoulder External Rotation) Level 2 (Red)   External Rotation Limitations cues for scap. squeeze; towel roll   Internal Rotation AROM;Right;Strengthening;Theraband   Theraband Level (Shoulder Internal Rotation) Level 2 (Red)   Internal Rotation Limitations with towel    Extension AROM;Theraband;15 reps  3" hold    Theraband Level (Shoulder Extension) Level 2 (Red)   Row AROM;15 reps;Theraband  5" hold    Theraband Level (Shoulder Row) Level 3 (Green)     Traction   Type of Traction Cervical   Min (lbs) 10   Max (lbs) 16   Hold Time 60   Rest Time 20   Time 15     Manual Therapy   Manual Therapy Soft tissue mobilization;Passive ROM   Manual therapy comments pt supine   Soft tissue mobilization B UT, LS, medial scapular border;    Passive ROM Gentle stretching B UT, Levater scap.  Manual Traction Manual cervical traction with B UT, Levator scap. stretches and neutral x 2 min; Still noting relief from manual traction     Neck Exercises: Stretches   Upper Trapezius Stretch 30 seconds;2 reps  seated on edge of table    Levator Stretch 30 seconds;2 reps  seated on edge of table    Corner Stretch 30 seconds;3 reps   Corner Stretch Limitations at doorseal    Chest Stretch 2 reps;30 seconds  Supine on 1/2 foam bolster                  PT Short Term Goals - 03/12/17 4259      PT SHORT TERM GOAL #1   Title Independent with initial HEP by 03/15/17   Status Achieved     PT SHORT TERM GOAL #2   Title Pt will verbalize understanding of neutral spine and shoulder posture by 03/15/17   Status Achieved           PT Long Term Goals - 03/22/17 1546      PT LONG TERM GOAL #1   Title Independent with advanced HEP + gym  program as indicated by 04/16/17   Status On-going     PT LONG TERM GOAL #2   Title Cervical ROM WFL w/o pain by 04/16/17   Status On-going     PT LONG TERM GOAL #3   Title R shoulder ROM w/in 5 dg of L w/o increased pain by 04/16/17   Status Partially Met  4.9.18: only met for IR     PT LONG TERM GOAL #4   Title R shoulder strength >/=4+/5 by 04/16/17   Status Partially Met  4.9.18: R shoulder abduction, IR, and ER still 4/5.  Met for flexion 4+/5.     PT LONG TERM GOAL #5   Title Pt will report no limitation in daily chores, driving or leisure activities (knitting/crochet) due to neck or R UE pain by 04/16/17   Status On-going  4.9.18: Pt. still avoids picking up heavy objects and has not yet returned to knitting/crochet. due to neck and R UE pain.                Plan - 03/29/17 1026    Clinical Impression Statement Pt. reporting she had increased tingling in B hands Saturday which resolved by next day over weekend.  Pt. has been, "feeling good" since Saturday.  Pt. unable to identify trigger for this.  Pt. still confident she is getting benefit from traction thus this continued today.  Pt. reporting she can feel improved cervical motion while driving, checking blind spot, etc.  Treatment today with mild progression in scapular strengthening and continued focus on manual stretching to cervical musculature to improve motion and decrease symptoms.  Pt. still reporting benefit from manual work to cervical area and feels she is improving with therapy.  Pt. has not yet attempted knitting and was instructed to try knitting to see if able to perform without cervical limitation.     PT Treatment/Interventions Patient/family education;Neuromuscular re-education;Therapeutic exercise;Therapeutic activities;Functional mobility training;Manual techniques;Taping;Dry needling;Traction;Moist Heat;Electrical Stimulation;Ultrasound;Cryotherapy;Iontophoresis 48m/ml Dexamethasone;ADLs/Self Care Home Management    PT Next Visit Plan 10 VISIT G-code; FOTO; Postural strengthening - scapular stabilization + anterior stretching; Manual therapy including manual traction; Continue mechanical traction if benefit noted; Modalities PRN      Patient will benefit from skilled therapeutic intervention in order to improve the following deficits and impairments:  Pain, Decreased range of motion, Impaired flexibility, Decreased  strength, Postural dysfunction, Improper body mechanics, Impaired UE functional use  Visit Diagnosis: Radiculopathy, cervical region  Pain in right arm  Cervicalgia  Abnormal posture     Problem List Patient Active Problem List   Diagnosis Date Noted  . Cervical disc disorder with radiculopathy of cervical region 03/02/2017  . PCP notes ------------------------- 08/28/2015  . Diabetes ([place]) 04/11/2013  . Annual physical exam 10/16/2011  . Anxiety-insomnia   . OSTEOPENIA 11/09/2008  . OSTEOARTHRITIS 09/26/2008  . Dyslipidemia 10/04/2007  . HTN (hypertension) 10/04/2007  . ALLERGIC RHINITIS 10/04/2007  . GERD 10/04/2007    Bess Harvest, PTA 03/29/17 1:14 PM  [facility] [address]  [place], [place], [address] Phone: [phone]   Fax:  [phone]  Name: ABBYGALE LAPID MRN: [id number] Date of Birth: 1936/07/09

## 2017-03-29 NOTE — Assessment & Plan Note (Signed)
consistent with either C6 or C7 radiculopathy.  Clinically improving with PT, prednisone.  Continue PT.  Aleve if needed.  Heat fo spasms.  Discussed ergonomic considerations.  F/u in 6 weeks.

## 2017-04-01 ENCOUNTER — Ambulatory Visit: Payer: Medicare Other | Admitting: Physical Therapy

## 2017-04-05 ENCOUNTER — Ambulatory Visit: Payer: Medicare Other | Admitting: Physical Therapy

## 2017-04-05 DIAGNOSIS — M5412 Radiculopathy, cervical region: Secondary | ICD-10-CM

## 2017-04-05 DIAGNOSIS — R293 Abnormal posture: Secondary | ICD-10-CM

## 2017-04-05 DIAGNOSIS — M79601 Pain in right arm: Secondary | ICD-10-CM

## 2017-04-05 DIAGNOSIS — M542 Cervicalgia: Secondary | ICD-10-CM

## 2017-04-05 MED FILL — GABAPENTIN 300 MG CAPSULE: 300 | 30 days supply | Qty: 60 | Fill #1

## 2017-04-05 NOTE — Therapy (Signed)
[facility] [address]  [place], [place], [address] Phone: [phone]   Fax:  [phone]  Physical Therapy Treatment  Patient Details  Name: Monique Bauer MRN: [id number] Date of Birth: [birth year] Referring Provider: Karlton Lemon, MD  Encounter Date: 04/05/2017      PT End of Session - 04/05/17 0932    Visit Number 10   Number of Visits 12   Date for PT Re-Evaluation 04/16/17   Authorization Type UHC Medicare [place]   PT Start Time 0932   PT Stop Time 1036   PT Time Calculation (min) 64 min   Activity Tolerance Patient tolerated treatment well   Behavior During Therapy [facility] for tasks assessed/performed      Past Medical History:  Diagnosis Date  . Anxiety   . Diabetes ([place]) 12-2012  . GERD (gastroesophageal reflux disease)   . Hyperlipidemia   . Hypertension   . Osteoarthritis   . Osteopenia   . Screening for AAA (abdominal aortic aneurysm) 11/2009   aorta U/S, no AAA    Past Surgical History:  Procedure Laterality Date  . NO PAST SURGERIES      There were no vitals filed for this visit.      Subjective Assessment - 04/05/17 0936    Subjective Pt feels like things are improving but still having the tingling from the R wrist up into R forearm. Pt noting increased stiffness and difficulty movin her head after last traction session.   Pertinent History DM, OA, osteopenia   Patient Stated Goals "pain-free"   Currently in Pain? Yes   Pain Score 5    Pain Location Arm   Pain Orientation Right;Distal   Pain Descriptors / Indicators --  stinging   Pain Type Acute pain   Pain Radiating Towards originating at wrist and extending into forearm up to mid upper arm   Pain Onset More than a month ago   Pain Frequency Intermittent            [facility] PT Assessment - 04/05/17 0932      Assessment   Medical Diagnosis Cervical disc disorder with radiculopathy   Referring Provider Karlton Lemon, MD   Onset Date/Surgical Date --  ~6 wks   Hand Dominance Right   Next MD Visit 05/06/17   Prior Therapy none     Observation/Other Assessments   Focus on Therapeutic Outcomes (FOTO)  Neck - 58% (42% limitation)     AROM   Right Shoulder Flexion 144 Degrees   Right Shoulder ABduction 154 Degrees   Right Shoulder Internal Rotation 72 Degrees   Right Shoulder External Rotation 89 Degrees   Left Shoulder Flexion 163 Degrees   Left Shoulder ABduction 162 Degrees   Left Shoulder Internal Rotation 85 Degrees   Left Shoulder External Rotation 92 Degrees   Cervical Flexion 50   Cervical Extension 48   Cervical - Right Side Bend 32   Cervical - Left Side Bend 28   Cervical - Right Rotation 55   Cervical - Left Rotation 51     Strength   Right Shoulder Flexion 4+/5   Right Shoulder ABduction 4/5   Right Shoulder Internal Rotation 4+/5   Right Shoulder External Rotation 4/5   Left Shoulder Flexion 4+/5   Left Shoulder ABduction 4+/5   Left Shoulder Internal Rotation 4+/5   Left Shoulder External Rotation 4/5   Right Hand Grip (lbs) 22, 17, 15   Left  Hand Grip (lbs) 19, 15, 12                     [facility] Adult PT Treatment/Exercise - 04/05/17 0932      Neck Exercises: Machines for Strengthening   UBE (Upper Arm Bike) lvl 3.0 fwd/back x 3' each     Hand Exercises for Cervical Radiculopathy   Other Hand Exercise for Cervical Radiculopathy R UE median, radial & ulnar glides - manual by PT in supine, performed actively by pt in sitting x10 ea   Other Hand Exercise for Cervical Radiculopathy Seated brachial plexsus nerve glide x5     Modalities   Modalities Electrical Stimulation;Moist Heat     Manual Therapy   Manual Therapy Soft tissue mobilization   Manual therapy comments pt supine   Soft tissue mobilization R shoulder inferior/posterior RTC                PT Education - 04/05/17 1030    Education provided Yes   Education Details UE nerve  (median, radial & ulnar) glides; brachial plexus nerve glide   Person(s) Educated Patient   Methods Explanation;Demonstration;Handout   Comprehension Verbalized understanding;Returned demonstration;Need further instruction          PT Short Term Goals - 03/12/17 1031      PT SHORT TERM GOAL #1   Title Independent with initial HEP by 03/15/17   Status Achieved     PT SHORT TERM GOAL #2   Title Pt will verbalize understanding of neutral spine and shoulder posture by 03/15/17   Status Achieved           PT Long Term Goals - 04/05/17 0941      PT LONG TERM GOAL #1   Title Independent with advanced HEP + gym program as indicated by 04/16/17   Status On-going     PT LONG TERM GOAL #2   Title Cervical ROM WFL w/o pain by 04/16/17   Status Partially Met  cervical ROM essentially WFL; no pain, but some stiffness reported     PT LONG TERM GOAL #3   Title R shoulder ROM w/in 5 dg of L w/o increased pain by 04/16/17   Status Partially Met     PT LONG TERM GOAL #4   Title R shoulder strength >/=4+/5 by 04/16/17   Status Partially Met     PT LONG TERM GOAL #5   Title Pt will report no limitation in daily chores, driving or leisure activities (knitting/crochet) due to neck or R UE pain by 04/16/17   Status On-going               Plan - 04/05/17 0941    Clinical Impression Statement Pt noting improvement in cervical ROM with decreased pain and only minor stiffness at present, alllowing for better ability to turn her head while driving. Shoulder ROM also improved since eval. Pt greatest remaining concern is continued tingling and stinging pain from R wrist up into forearm and mid upper arm which continues to prevent her from knitting/crocheting and scrolling on her tablet. Nerves glides introduced with some relief of radicular symptoms noted and treatment concluded with estim and moist heat from which pt also noted benefit. Will look in to insurance coverage for a home TENS/IFC unit. Given  current progress and remaining deficits, anticipate pt will benefit from recert at end of current POC.   Rehab Potential Good   Clinical Impairments Affecting Rehab Potential DM, OA, osteopenia  PT Treatment/Interventions Patient/family education;Neuromuscular re-education;Therapeutic exercise;Therapeutic activities;Functional mobility training;Manual techniques;Taping;Dry needling;Traction;Moist Heat;Electrical Stimulation;Ultrasound;Cryotherapy;Iontophoresis 40m/ml Dexamethasone;ADLs/Self Care Home Management   PT Next Visit Plan Assess response to nerve glides and estim; Review/update HEP as indicated; Postural strengthening - scapular stabilization + anterior stretching; Manual therapy including manual traction; Modalities PRN   Consulted and Agree with Plan of Care Patient      Patient will benefit from skilled therapeutic intervention in order to improve the following deficits and impairments:  Pain, Decreased range of motion, Impaired flexibility, Decreased strength, Postural dysfunction, Improper body mechanics, Impaired UE functional use  Visit Diagnosis: Radiculopathy, cervical region  Pain in right arm  Cervicalgia  Abnormal posture     Problem List Patient Active Problem List   Diagnosis Date Noted  . Cervical disc disorder with radiculopathy of cervical region 03/02/2017  . PCP notes ------------------------- 08/28/2015  . Diabetes ([place] 04/11/2013  . Annual physical exam 10/16/2011  . Anxiety-insomnia   . OSTEOPENIA 11/09/2008  . OSTEOARTHRITIS 09/26/2008  . Dyslipidemia 10/04/2007  . HTN (hypertension) 10/04/2007  . ALLERGIC RHINITIS 10/04/2007  . GERD 10/04/2007    JPercival Spanish PT, MPT 04/05/2017, 11:02 AM  [facility] [place] [address]Phone: [phone]  Fax:  [phone] Name: MBRYTTNEY NETZERMRN: [id number] of Birth: 111-01-1936

## 2017-04-08 ENCOUNTER — Ambulatory Visit: Payer: Medicare Other | Admitting: Physical Therapy

## 2017-04-08 DIAGNOSIS — M5412 Radiculopathy, cervical region: Secondary | ICD-10-CM

## 2017-04-08 DIAGNOSIS — M542 Cervicalgia: Secondary | ICD-10-CM

## 2017-04-08 DIAGNOSIS — R293 Abnormal posture: Secondary | ICD-10-CM

## 2017-04-08 DIAGNOSIS — M79601 Pain in right arm: Secondary | ICD-10-CM

## 2017-04-08 NOTE — Therapy (Signed)
[facility] [address]  [place], [place], [address] Phone: [phone]   Fax:  [phone]  Physical Therapy Treatment  Patient Details  Name: Monique Bauer MRN: [id number] Date of Birth: September 21, 1936 Referring Provider: Karlton Lemon, MD  Encounter Date: 04/08/2017      PT End of Session - 04/08/17 0934    Visit Number 11   Number of Visits 12   Date for PT Re-Evaluation 04/16/17   Authorization Type UHC Medicare [place]: [place]   PT Start Time 0934   PT Stop Time 1032   PT Time Calculation (min) 58 min   Activity Tolerance Patient tolerated treatment well   Behavior During Therapy [facility] for tasks assessed/performed      Past Medical History:  Diagnosis Date  . Anxiety   . Diabetes ([place]) 12-2012  . GERD (gastroesophageal reflux disease)   . Hyperlipidemia   . Hypertension   . Osteoarthritis   . Osteopenia   . Screening for AAA (abdominal aortic aneurysm) 11/2009   aorta U/S, no AAA    Past Surgical History:  Procedure Laterality Date  . NO PAST SURGERIES      There were no vitals filed for this visit.      Subjective Assessment - 04/08/17 0938    Subjective Pt noting benefit from nerve glides - when noticing radicular pain, typically able to resolve pain using nerve glides. Has only needed one pain pill since last PT visit.   Pertinent History DM, OA, osteopenia   Patient Stated Goals "pain-free"   Currently in Pain? No/denies   Pain Onset More than a month ago                         [facility] Adult PT Treatment/Exercise - 04/08/17 0934      Neck Exercises: Machines for Strengthening   UBE (Upper Arm Bike) lvl 3.0 fwd/back x 3' each     Neck Exercises: Theraband   Shoulder Extension 15 reps;Green  3" hold    Shoulder Extension Limitations standing - cues for scapular emphasis (avoiding excessive UE motion)   Rows 15 reps;Green  5" hold    Rows Limitations standing - cues for  scapular emphasis (avoiding excessive UE motion)   Shoulder External Rotation 15 reps;Red   Shoulder External Rotation Limitations standing against doorframe   Horizontal ABduction 15 reps;Red   Horizontal ABduction Limitations standing against doorframe     Modalities   Modalities Electrical Stimulation;Moist Heat     Moist Heat Therapy   Number Minutes Moist Heat 15 Minutes   Moist Heat Location Shoulder;Cervical     Electrical Stimulation   Electrical Stimulation Location R upper shoulder/lateral neck   Electrical Stimulation Action IFC    Electrical Stimulation Parameters 80-150 Hz, intensity to pt tol x15'   Electrical Stimulation Goals Pain;Tone     Manual Therapy   Manual Therapy Soft tissue mobilization;Myofascial release;Joint mobilization;Passive ROM;Manual Traction   Joint Mobilization R 1st & 2nd rib grade 2-3 PA mobs   Soft tissue mobilization B UT & pecs   Myofascial Release TPR to R UT & pecs   Passive ROM cervical PROM all planes with slight overpressure at end range   Manual Traction Cervical distraction 5x30" in slight flexion     Neck Exercises: Stretches   Upper Trapezius Stretch 30 seconds;3 reps  manual   Levator Stretch 30 seconds;1 rep  manual  PT Short Term Goals - 03/12/17 6568      PT SHORT TERM GOAL #1   Title Independent with initial HEP by 03/15/17   Status Achieved     PT SHORT TERM GOAL #2   Title Pt will verbalize understanding of neutral spine and shoulder posture by 03/15/17   Status Achieved           PT Long Term Goals - 04/05/17 0941      PT LONG TERM GOAL #1   Title Independent with advanced HEP + gym program as indicated by 04/16/17   Status On-going     PT LONG TERM GOAL #2   Title Cervical ROM WFL w/o pain by 04/16/17   Status Partially Met  cervical ROM essentially WFL; no pain, but some stiffness reported     PT LONG TERM GOAL #3   Title R shoulder ROM w/in 5 dg of L w/o increased pain by  04/16/17   Status Partially Met     PT LONG TERM GOAL #4   Title R shoulder strength >/=4+/5 by 04/16/17   Status Partially Met     PT LONG TERM GOAL #5   Title Pt will report no limitation in daily chores, driving or leisure activities (knitting/crochet) due to neck or R UE pain by 04/16/17   Status On-going               Plan - 04/08/17 0940    Clinical Impression Statement Pt noting significant benefit from nerve glides with ability to resolve radicular symptoms if performed at onset of symptoms. Pt still with increased muscle tension in B UT & pecs with TPs on R - good response to manual therapy. Pt reporting discomfort with standing theraband HEP exercises at home therefore reviewed technique and proviede corrections with pt able to perform exercises w/o increased pain. Treatment concluded with estim and moist heat. Still awaiting response re: insurance coverage for home TENS/IFC unit.    Rehab Potential Good   Clinical Impairments Affecting Rehab Potential DM, OA, osteopenia   PT Treatment/Interventions Patient/family education;Neuromuscular re-education;Therapeutic exercise;Therapeutic activities;Functional mobility training;Manual techniques;Taping;Dry needling;Traction;Moist Heat;Electrical Stimulation;Ultrasound;Cryotherapy;Iontophoresis 38m/ml Dexamethasone;ADLs/Self Care Home Management   PT Next Visit Plan Recert vs transition to HEP; review/update HEP as indicated; Postural strengthening - scapular stabilization + anterior stretching; Manual therapy including manual traction; Modalities PRN   Consulted and Agree with Plan of Care Patient      Patient will benefit from skilled therapeutic intervention in order to improve the following deficits and impairments:  Pain, Decreased range of motion, Impaired flexibility, Decreased strength, Postural dysfunction, Improper body mechanics, Impaired UE functional use  Visit Diagnosis: Radiculopathy, cervical region  Pain in right  arm  Cervicalgia  Abnormal posture     Problem List Patient Active Problem List   Diagnosis Date Noted  . Cervical disc disorder with radiculopathy of cervical region 03/02/2017  . PCP notes ------------------------- 08/28/2015  . Diabetes ([place] 04/11/2013  . Annual physical exam 10/16/2011  . Anxiety-insomnia   . OSTEOPENIA 11/09/2008  . OSTEOARTHRITIS 09/26/2008  . Dyslipidemia 10/04/2007  . HTN (hypertension) 10/04/2007  . ALLERGIC RHINITIS 10/04/2007  . GERD 10/04/2007    JPercival Spanish PT, MPT 04/08/2017, 1:16 PM  [facility] [place] [address]Phone: [phone]  Fax:  [phone] Name: Monique SISTAREMRN: [id number] of Birth: 112-31-1937

## 2017-04-12 ENCOUNTER — Ambulatory Visit: Payer: Medicare Other | Admitting: Physical Therapy

## 2017-04-12 DIAGNOSIS — M5412 Radiculopathy, cervical region: Secondary | ICD-10-CM

## 2017-04-12 DIAGNOSIS — M79601 Pain in right arm: Secondary | ICD-10-CM

## 2017-04-12 DIAGNOSIS — M542 Cervicalgia: Secondary | ICD-10-CM

## 2017-04-12 DIAGNOSIS — R293 Abnormal posture: Secondary | ICD-10-CM

## 2017-04-12 NOTE — Therapy (Addendum)
[facility] [address]  [place], [place], [address] Phone: [phone]   Fax:  [phone]  Physical Therapy Treatment  Patient Details  Name: Monique Bauer MRN: [id number] Date of Birth: October 06, 1936 Referring Provider: Karlton Lemon, MD  Encounter Date: 04/12/2017      PT End of Session - 04/12/17 0928    Visit Number 12   Number of Visits 12   Date for PT Re-Evaluation 04/16/17   Authorization Type UHC Medicare [place]   PT Start Time 0928   PT Stop Time 1012   PT Time Calculation (min) 44 min   Activity Tolerance Patient tolerated treatment well   Behavior During Therapy [facility] for tasks assessed/performed      Past Medical History:  Diagnosis Date  . Anxiety   . Diabetes ([place]) 12-2012  . GERD (gastroesophageal reflux disease)   . Hyperlipidemia   . Hypertension   . Osteoarthritis   . Osteopenia   . Screening for AAA (abdominal aortic aneurysm) 11/2009   aorta U/S, no AAA    Past Surgical History:  Procedure Laterality Date  . NO PAST SURGERIES      There were no vitals filed for this visit.      Subjective Assessment - 04/12/17 0930    Subjective Pt reporting onset of B numbness in thumbs upon waking Saturday which persists today. Reports that original R UE pain that brought her to therapy is now only occasional and very brief, typically resolving on its own most of the time or otherwise able to be resolved by the nerve glides.   Pertinent History DM, OA, osteopenia   Patient Stated Goals "pain-free"   Currently in Pain? No/denies   Pain Score 0-No pain   Pain Location Finger (Comment which one)  B thumbs   Pain Orientation Right;Left   Pain Descriptors / Indicators Numbness   Pain Onset 1 to 4 weeks ago            [facility] PT Assessment - 04/12/17 0928      Assessment   Medical Diagnosis Cervical disc disorder with radiculopathy   Referring Provider Karlton Lemon, MD   Onset  Date/Surgical Date --  ~6 wks   Hand Dominance Right   Next MD Visit 05/06/17     Observation/Other Assessments   Focus on Therapeutic Outcomes (FOTO)  Neck - 69% (31% limitation)     AROM   Right Shoulder Flexion 152 Degrees   Right Shoulder ABduction 154 Degrees   Right Shoulder Internal Rotation 80 Degrees  FIR - thumb to T8   Right Shoulder External Rotation 91 Degrees  FER to T1   Left Shoulder Flexion 162 Degrees   Left Shoulder ABduction 162 Degrees   Left Shoulder Internal Rotation 85 Degrees  FIR - thumb to T8   Left Shoulder External Rotation 92 Degrees  FER to T3   Cervical Flexion 48   Cervical Extension 56   Cervical - Right Side Bend 36   Cervical - Left Side Bend 35   Cervical - Right Rotation 62   Cervical - Left Rotation 60     Strength   Right Shoulder Flexion 4+/5   Right Shoulder ABduction 4+/5   Right Shoulder Internal Rotation 4+/5   Right Shoulder External Rotation 4/5   Left Shoulder Flexion 4+/5   Left Shoulder ABduction 4+/5   Left Shoulder Internal Rotation 4+/5   Left Shoulder External Rotation 4/5  [facility] Adult PT Treatment/Exercise - 04/12/17 0928      Neck Exercises: Machines for Strengthening   UBE (Upper Arm Bike) lvl 3.0 fwd/back x 3' each   Cybex Row Narrow/low row 15# x15, Wide/mid row 15# x15   Cybex Chest Press Narrow grip 10# x15   Other Machines for Strengthening BATCA pull down 15# x15                PT Education - 04/12/17 1011    Education provided Yes   Education Details Education in proper use of gym equip; recommendations for continued HEP/gym program   Person(s) Educated Patient   Methods Explanation;Demonstration   Comprehension Verbalized understanding;Returned demonstration          PT Short Term Goals - 03/12/17 0922      PT SHORT TERM GOAL #1   Title Independent with initial HEP by 03/15/17   Status Achieved     PT SHORT TERM GOAL #2   Title Pt will verbalize  understanding of neutral spine and shoulder posture by 03/15/17   Status Achieved           PT Long Term Goals - 04/12/17 0937      PT LONG TERM GOAL #1   Title Independent with advanced HEP + gym program as indicated by 04/16/17   Status Achieved     PT LONG TERM GOAL #2   Title Cervical ROM WFL w/o pain by 04/16/17   Status Achieved     PT LONG TERM GOAL #3   Title R shoulder ROM w/in 5 dg of L w/o increased pain by 04/16/17   Status Partially Met  met except R flexion & abduction </= 10 dg less than L     PT LONG TERM GOAL #4   Title R shoulder strength >/=4+/5 by 04/16/17   Status Partially Met  met except ER     PT LONG TERM GOAL #5   Title Pt will report no limitation in daily chores, driving or leisure activities (knitting/crochet) due to neck or R UE pain by 04/16/17   Status Partially Met  pt reporting no concerns with driving, chores or housework, but has not attempted knitting or crocheting               Plan - 04/12/17 1012    Clinical Impression Statement Pt reporting she feels like she "has turned a corner" over the past few visits since the addtion of the nerve glides. Pt reports R UE radicular pain mostly gone; when present it is very mild and brief, typically resolving on its own or able to be alleviated with nerve glides. Pt noting new mild numbness in B thumbs today which she first noted Sat morning, but feels that this is very different than the prior radicular symptoms and wonders if it may be related to her diabetes. Cervical ROM now [facility] w/o pain and only mild stiffness noted. R shoulder ROM also improved with R AROM to w/in 10 dg of L in flexion & abduction and 5 dg of L in ER & IR w/o pain, just tightness. Pt reports no further issues with driving, noting ability to check blind spot w/o difficulty, and able to perform normal daily chores and household tasks w/o limitation d/t neck or R UE pain, although has not tried knitting or crocheting again yet. Given  recent improvements, pt interested in trying to transition to home/gym program at this point, therefore reviewed recommended gym activities and educated pt  in proper form/technique for use of gym equipment. Pt reporting she feels comfortable with existing HEP. Will place pt on hold for 30 days in the event that further issues arise as she starts back to knitting or crocheting and returns to the ym.   Rehab Potential Good   Clinical Impairments Affecting Rehab Potential DM, OA, osteopenia   PT Treatment/Interventions Patient/family education;Neuromuscular re-education;Therapeutic exercise;Therapeutic activities;Functional mobility training;Manual techniques;Taping;Dry needling;Traction;Moist Heat;Electrical Stimulation;Ultrasound;Cryotherapy;Iontophoresis 26m/ml Dexamethasone;ADLs/Self Care Home Management   PT Next Visit Plan 30 day hold   Consulted and Agree with Plan of Care Patient      Patient will benefit from skilled therapeutic intervention in order to improve the following deficits and impairments:  Pain, Decreased range of motion, Impaired flexibility, Decreased strength, Postural dysfunction, Improper body mechanics, Impaired UE functional use  Visit Diagnosis: Radiculopathy, cervical region  Pain in right arm  Cervicalgia  Abnormal posture       G-Codes - 02018/05/061014    Functional Assessment Tool Used (Outpatient Only) Neck FOTO = 69% (31% limitation)   Functional Limitation Changing and maintaining body position   Changing and Maintaining Body Position Goal Status ((Q6578 At least 20 percent but less than 40 percent impaired, limited or restricted   Changing and Maintaining Body Position Discharge Status (([id number] At least 20 percent but less than 40 percent impaired, limited or restricted      Problem List Patient Active Problem List   Diagnosis Date Noted  . Cervical disc disorder with radiculopathy of cervical region 03/02/2017  . PCP notes -------------------------  08/28/2015  . Diabetes ([place] 04/11/2013  . Annual physical exam 10/16/2011  . Anxiety-insomnia   . OSTEOPENIA 11/09/2008  . OSTEOARTHRITIS 09/26/2008  . Dyslipidemia 10/04/2007  . HTN (hypertension) 10/04/2007  . ALLERGIC RHINITIS 10/04/2007  . GERD 10/04/2007    JPercival Spanish PT, MPT 406-May-2018 11:42 AM  [facility] [place] [address]Phone: [phone]  Fax:  [phone] Name: Monique WALSTADMRN: [id number] of Birth: [birth year] PHYSICAL THERAPY DISCHARGE SUMMARY  Visits from Start of Care: 12  Current functional level related to goals / functional outcomes:   Refer to above clinical impression for status as of last visit. Review of f/u appt with MD on 05/03/17 revealed worsening of radicular symptoms with surgical consult & pt underwent ACDF at C4-5 & C5-6 on 05/17/17, therefore will proceed with discharge from PT for this episode.   Remaining deficits:   As above.   Education / Equipment:   HEP, pBiomedical scientisteducation  Plan: Patient agrees to discharge.  Patient goals were partially met. Patient is being discharged due to a change in medical status.  ?????    JPercival Spanish PT, MPT 05/18/17, 8:23 AM  [facility] [place] [address]Phone: [phone]  Fax:  [phone]

## 2017-04-15 ENCOUNTER — Ambulatory Visit: Payer: Medicare Other | Admitting: Physical Therapy

## 2017-04-19 ENCOUNTER — Ambulatory Visit: Payer: Medicare Other

## 2017-04-22 ENCOUNTER — Ambulatory Visit: Payer: Medicare Other | Admitting: Physical Therapy

## 2017-04-26 ENCOUNTER — Ambulatory Visit: Payer: Medicare Other | Admitting: Physical Therapy

## 2017-04-28 ENCOUNTER — Ambulatory Visit (INDEPENDENT_AMBULATORY_CARE_PROVIDER_SITE_OTHER): Payer: Medicare Other | Admitting: Family Medicine

## 2017-04-28 ENCOUNTER — Encounter: Payer: Self-pay | Admitting: Family Medicine

## 2017-04-28 VITALS — BP 129/82 | HR 67 | Ht 60.0 in | Wt 140.0 lb

## 2017-04-28 DIAGNOSIS — M5010 Cervical disc disorder with radiculopathy, unspecified cervical region: Secondary | ICD-10-CM | POA: Diagnosis not present

## 2017-04-28 MED ORDER — NORTRIPTYLINE HCL 10 MG PO CAPS: 10.0000 mg | ORAL_CAPSULE | Freq: Every day | ORAL | 0 refills | Status: DC

## 2017-04-28 MED FILL — NORTRIPTYLINE HCL 10 MG CAP: 10 | 30 days supply | Qty: 30 | Fill #0

## 2017-04-28 NOTE — Patient Instructions (Signed)
Stop the gabapentin. Try the low dose nortriptyline at bedtime. If you do well with this after a few days you can take 2 of the 10mg  tablets. If you're doing well we can send in the 25mg  tablets. We will go ahead with an MRI of your cervical spine to assess for spinal stenosis. We will consider injections depending on these results.

## 2017-04-29 ENCOUNTER — Ambulatory Visit: Payer: Medicare Other

## 2017-05-03 ENCOUNTER — Ambulatory Visit (INDEPENDENT_AMBULATORY_CARE_PROVIDER_SITE_OTHER): Payer: Medicare Other

## 2017-05-03 ENCOUNTER — Ambulatory Visit: Payer: Medicare Other | Admitting: Physical Therapy

## 2017-05-03 DIAGNOSIS — M50022 Cervical disc disorder at C5-C6 level with myelopathy: Secondary | ICD-10-CM | POA: Diagnosis not present

## 2017-05-03 DIAGNOSIS — M5010 Cervical disc disorder with radiculopathy, unspecified cervical region: Secondary | ICD-10-CM

## 2017-05-03 NOTE — Assessment & Plan Note (Signed)
concerning for advancement from radiculopathy to spinal stenosis.  She will stop the gabapentin and try nortriptyline instead.  Will go ahead with MRI cervical spine to further assess.  Consider ESIs, neurosurgery referral depending on results.  S/p PT, home exercises.   Aleve, heat if needed.  Ergonomic considerations.

## 2017-05-03 NOTE — Progress Notes (Addendum)
PCP and consultation requested by: Colon Branch, MD  Subjective:   HPI: Patient is a 81 y.o. female here for right arm pain.  3/15: Patient denies known injury or trauma. She states about 1 month ago she started to get pain in right side of neck and arm. This has progressed to 5/10 level, sharp. Associated pain in right hand and feels very sensitive. No prior issues. No rash. Is right handed. Took prednisone which did help. Tried wrist braces but felt this irritated her more. No bowel/bladder dysfunction. No skin changes.  4/12: Patient reports she is improving. About 50% improved from last visit. Doing physical therapy, motion has improved. Taking gabapentin twice a day. Has a sharp pain that still comes on but goes away quickly. Pain with gripping, lying arm on something. No skin changes. Pain level 0/10 currently.  5/16: Patient reports she feels about the same. Reports bilateral arm pain now though with itching, burning, stinging feeling. Pain up to 8/10 - hurts to put hands in water, pick up items. Radiates down arms from the neck. Takes gabapentin and has been doing PT, home exercises. Better when sitting up straight. No skin changes. No bowel/bladder dysfunction.  Past Medical History:  Diagnosis Date  . Anxiety   . Diabetes ([place]) 12-2012  . GERD (gastroesophageal reflux disease)   . Hyperlipidemia   . Hypertension   . Osteoarthritis   . Osteopenia   . Screening for AAA (abdominal aortic aneurysm) 11/2009   aorta U/S, no AAA    Current Outpatient Prescriptions on File Prior to Visit  Medication Sig Dispense Refill  . ALPRAZolam (XANAX) 0.25 MG tablet Take 1 tablet (0.25 mg total) by mouth 3 (three) times daily as needed for sleep. 60 tablet 2  . aspirin 81 MG tablet Take 81 mg by mouth daily.      . Calcium Carbonate-Vitamin D (CALCIUM + D PO) Take by mouth.      . escitalopram (LEXAPRO) 10 MG tablet Take 1 tablet (10 mg total) by mouth daily. 90 tablet  2  . gabapentin (NEURONTIN) 300 MG capsule Take 1 capsule (300 mg total) by mouth 2 (two) times daily. 60 capsule 3  . glucosamine-chondroitin 500-400 MG tablet Take 1 tablet by mouth 3 (three) times daily.      Marland Kitchen HYDROcodone-acetaminophen (NORCO/VICODIN) 5-325 MG tablet Take 1-2 tablets by mouth every 8 (eight) hours as needed. (Patient not taking: Reported on 03/01/2017) 30 tablet 0  . hydrocortisone cream 1 % Apply to affected area 2 times daily 15 g 0  . lisinopril-hydrochlorothiazide (PRINZIDE,ZESTORETIC) 10-12.5 MG tablet Take 1 tablet by mouth daily. 90 tablet 2  . meloxicam (MOBIC) 7.5 MG tablet Take 1 tablet (7.5 mg total) by mouth daily as needed. for pain 90 tablet 0  . metFORMIN (GLUCOPHAGE) 500 MG tablet Take 1 tablet (500 mg total) by mouth 2 (two) times daily with a meal. 180 tablet 2  . Multiple Vitamin (MULTIVITAMIN) capsule Take 1 capsule by mouth daily.      Glory Rosebush DELICA LANCETS FINE MISC Check blood sugars once  daily. (Patient not taking: Reported on 03/04/2017) 100 each 12  . ONETOUCH VERIO test strip Check blood sugars once  daily. (Patient not taking: Reported on 03/04/2017) 100 each 12  . pantoprazole (PROTONIX) 40 MG tablet Take 1 tablet (40 mg total) by mouth daily. (Patient taking differently: Take 40 mg by mouth every other day. ) 90 tablet 1  . simvastatin (ZOCOR) 80 MG tablet Take 1  tablet (80 mg total) by mouth at bedtime. 90 tablet 1   No current facility-administered medications on file prior to visit.     Past Surgical History:  Procedure Laterality Date  . NO PAST SURGERIES      Allergies  Allergen Reactions  . Sulfamethoxazole-Trimethoprim     REACTION: rash all over    Social History   Social History  . Marital status: Married    Spouse name: Coralee Pesa  . Number of children: 3  . Years of education: N/A   Occupational History  . retired    .  Retired   Social History Main Topics  . Smoking status: Former Smoker    Quit date: 10/15/1981  .  Smokeless tobacco: Never Used     Comment: was a social smoker  . Alcohol use Yes     Comment: wine sometimes  . Drug use: No  . Sexual activity: No   Other Topics Concern  . Not on file   Social History Narrative    3 daughters live in [place], 5 G-children, 1 GG son, married x 3     Family History  Problem Relation Age of Onset  . Breast cancer Other        aunt   . Coronary artery disease Mother        M at age 64 and F  . Diabetes Mother        M, F, daughter, sister  . Hypertension Mother   . Stroke Father   . Asthma Sister   . Colon cancer Neg Hx     BP 129/82   Pulse 67   Ht 5' (1.524 m)   Wt 140 lb (63.5 kg)   BMI 27.34 kg/m   Review of Systems: See HPI above.     Objective:  Physical Exam:  Gen: NAD, comfortable in exam room  Neck: No gross deformity, swelling, bruising. Minimal TTP right cervical paraspinal region.  No midline/bony TTP. 30 degrees bilateral lateral rotations with pain. BUE strength 5/5.   Mildly diminished sensation in right index finger. 2+ equal reflexes in triceps, biceps, brachioradialis tendons. Negative spurlings.   Assessment & Plan:  1. Neck pain with radiation into both arms now - concerning for advancement from radiculopathy to spinal stenosis.  She will stop the gabapentin and try nortriptyline instead.  Will go ahead with MRI cervical spine to further assess.  Consider ESIs, neurosurgery referral depending on results.  S/p PT, home exercises.   Aleve, heat if needed.  Ergonomic considerations.  Addendum:  MRI reviewed and discussed with patient.  Severe spinal stenosis from C4-6 levels with cord compression.  She doesn't have any bowel/bladder dysfunction, saddle anesthesia.  Will refer to neurosurgery for discussion of surgical intervention.

## 2017-05-04 ENCOUNTER — Other Ambulatory Visit: Payer: Self-pay

## 2017-05-04 MED ORDER — ONETOUCH DELICA LANCETS FINE MISC: 12 refills | Status: DC

## 2017-05-04 MED ORDER — MELOXICAM 7.5 MG PO TABS: 7.5000 mg | ORAL_TABLET | Freq: Every day | ORAL | 0 refills | Status: DC | PRN

## 2017-05-04 MED ORDER — GLUCOSE BLOOD VI STRP: ORAL_STRIP | 12 refills | Status: DC

## 2017-05-06 ENCOUNTER — Ambulatory Visit: Payer: Medicare Other | Admitting: Family Medicine

## 2017-05-06 MED FILL — GABAPENTIN 300 MG CAPSULE: 300 | 30 days supply | Qty: 60 | Fill #2

## 2017-05-06 NOTE — Addendum Note (Signed)
Addended by: Sherrie George F on: 05/06/2017 10:44 AM   Modules accepted: Orders

## 2017-05-07 ENCOUNTER — Other Ambulatory Visit: Payer: Self-pay | Admitting: Neurosurgery

## 2017-05-07 ENCOUNTER — Ambulatory Visit (HOSPITAL_BASED_OUTPATIENT_CLINIC_OR_DEPARTMENT_OTHER)
Admission: RE | Admit: 2017-05-07 | Discharge: 2017-05-07 | Disposition: A | Discharge status: Home / Self Care | Payer: Medicare Other | Source: Ambulatory Visit | Attending: Neurosurgery | Admitting: Neurosurgery

## 2017-05-07 ENCOUNTER — Other Ambulatory Visit (HOSPITAL_BASED_OUTPATIENT_CLINIC_OR_DEPARTMENT_OTHER): Payer: Self-pay | Admitting: Neurosurgery

## 2017-05-07 DIAGNOSIS — G959 Disease of spinal cord, unspecified: Secondary | ICD-10-CM | POA: Diagnosis not present

## 2017-05-07 DIAGNOSIS — M5000 Cervical disc disorder with myelopathy, unspecified cervical region: Secondary | ICD-10-CM | POA: Insufficient documentation

## 2017-05-07 DIAGNOSIS — M47892 Other spondylosis, cervical region: Secondary | ICD-10-CM | POA: Insufficient documentation

## 2017-05-14 ENCOUNTER — Encounter (HOSPITAL_COMMUNITY): Payer: Self-pay | Admitting: *Deleted

## 2017-05-14 NOTE — Progress Notes (Signed)
Pt denies SOB, chest pain, and being under the care of a cardiologist. Pt stated that an echo was performed > 10 years ago but denies having a stress test and cardiac cath. Pt made aware to stop taking Aspirin, vitamins, fish oil, Glucosamine and herbal medications. Do not take any NSAIDs ie: Ibuprofen, Advil, Naproxen, Mobic, BC and Goody Powder or any medication containing Aspirin. Pt made aware to not take Metformin on DOS, check BG every 2 hours prior to arrival to hospital on DOS, treat a BG <70 with 4 ounces of apple juice, wait 15 minutes after drinking juice to recheck BG, if BG remains <70 call Short Stay unit to speak with a nurse. Pt verbalized understanding of all pre-op instructions.

## 2017-05-17 ENCOUNTER — Encounter (HOSPITAL_COMMUNITY)
Admission: RE | Disposition: A | Discharge status: Home / Self Care | Payer: Self-pay | Source: Ambulatory Visit | Attending: Neurosurgery

## 2017-05-17 ENCOUNTER — Inpatient Hospital Stay (HOSPITAL_COMMUNITY)
Admission: RE | Admit: 2017-05-17 | Discharge: 2017-05-18 | DRG: 472 | Disposition: A | Discharge status: Home / Self Care | Payer: Medicare Other | Source: Ambulatory Visit | Attending: Neurosurgery | Admitting: Neurosurgery

## 2017-05-17 ENCOUNTER — Inpatient Hospital Stay (HOSPITAL_COMMUNITY): Payer: Medicare Other

## 2017-05-17 ENCOUNTER — Inpatient Hospital Stay (HOSPITAL_COMMUNITY): Payer: Medicare Other | Admitting: Anesthesiology

## 2017-05-17 ENCOUNTER — Encounter (HOSPITAL_COMMUNITY): Payer: Self-pay

## 2017-05-17 DIAGNOSIS — M4712 Other spondylosis with myelopathy, cervical region: Secondary | ICD-10-CM | POA: Diagnosis present

## 2017-05-17 DIAGNOSIS — Z882 Allergy status to sulfonamides status: Secondary | ICD-10-CM | POA: Diagnosis not present

## 2017-05-17 DIAGNOSIS — Z791 Long term (current) use of non-steroidal anti-inflammatories (NSAID): Secondary | ICD-10-CM | POA: Diagnosis not present

## 2017-05-17 DIAGNOSIS — I10 Essential (primary) hypertension: Secondary | ICD-10-CM | POA: Diagnosis present

## 2017-05-17 DIAGNOSIS — Z419 Encounter for procedure for purposes other than remedying health state, unspecified: Secondary | ICD-10-CM

## 2017-05-17 DIAGNOSIS — Z803 Family history of malignant neoplasm of breast: Secondary | ICD-10-CM | POA: Diagnosis not present

## 2017-05-17 DIAGNOSIS — E119 Type 2 diabetes mellitus without complications: Secondary | ICD-10-CM | POA: Diagnosis present

## 2017-05-17 DIAGNOSIS — Z833 Family history of diabetes mellitus: Secondary | ICD-10-CM | POA: Diagnosis not present

## 2017-05-17 DIAGNOSIS — Z7982 Long term (current) use of aspirin: Secondary | ICD-10-CM

## 2017-05-17 DIAGNOSIS — Z7984 Long term (current) use of oral hypoglycemic drugs: Secondary | ICD-10-CM

## 2017-05-17 DIAGNOSIS — M50021 Cervical disc disorder at C4-C5 level with myelopathy: Principal | ICD-10-CM | POA: Diagnosis present

## 2017-05-17 DIAGNOSIS — E785 Hyperlipidemia, unspecified: Secondary | ICD-10-CM | POA: Diagnosis present

## 2017-05-17 DIAGNOSIS — K219 Gastro-esophageal reflux disease without esophagitis: Secondary | ICD-10-CM | POA: Diagnosis present

## 2017-05-17 DIAGNOSIS — G959 Disease of spinal cord, unspecified: Secondary | ICD-10-CM | POA: Diagnosis present

## 2017-05-17 DIAGNOSIS — Z87891 Personal history of nicotine dependence: Secondary | ICD-10-CM

## 2017-05-17 DIAGNOSIS — Z8249 Family history of ischemic heart disease and other diseases of the circulatory system: Secondary | ICD-10-CM | POA: Diagnosis not present

## 2017-05-17 DIAGNOSIS — M4802 Spinal stenosis, cervical region: Secondary | ICD-10-CM | POA: Diagnosis present

## 2017-05-17 HISTORY — DX: Family history of other specified conditions: Z84.89

## 2017-05-17 HISTORY — DX: Personal history of other diseases of the digestive system: Z87.19

## 2017-05-17 HISTORY — PX: ANTERIOR CERVICAL DECOMP/DISCECTOMY FUSION: SHX1161

## 2017-05-17 HISTORY — DX: Disease of spinal cord, unspecified: G95.9

## 2017-05-17 LAB — CBC
HEMATOCRIT: 41.7 % (ref 36.0–46.0)
Hemoglobin: 13.8 g/dL (ref 12.0–15.0)
MCH: 29 pg (ref 26.0–34.0)
MCHC: 33.1 g/dL (ref 30.0–36.0)
MCV: 87.6 fL (ref 78.0–100.0)
Platelets: 231 10*3/uL (ref 150–400)
RBC: 4.76 MIL/uL (ref 3.87–5.11)
RDW: 13.3 % (ref 11.5–15.5)
WBC: 7.4 10*3/uL (ref 4.0–10.5)

## 2017-05-17 LAB — GLUCOSE, CAPILLARY
GLUCOSE-CAPILLARY: 120 mg/dL — AB (ref 65–99)
GLUCOSE-CAPILLARY: 91 mg/dL (ref 65–99)
Glucose-Capillary: 125 mg/dL — ABNORMAL HIGH (ref 65–99)
Glucose-Capillary: 190 mg/dL — ABNORMAL HIGH (ref 65–99)

## 2017-05-17 LAB — SURGICAL PCR SCREEN
MRSA, PCR: NEGATIVE
Staphylococcus aureus: NEGATIVE

## 2017-05-17 LAB — BASIC METABOLIC PANEL
ANION GAP: 11 (ref 5–15)
BUN: 15 mg/dL (ref 6–20)
CALCIUM: 9.7 mg/dL (ref 8.9–10.3)
CO2: 27 mmol/L (ref 22–32)
Chloride: 103 mmol/L (ref 101–111)
Creatinine, Ser: 0.98 mg/dL (ref 0.44–1.00)
GFR, EST NON AFRICAN AMERICAN: 54 mL/min — AB (ref >60)
GLUCOSE: 108 mg/dL — AB (ref 65–99)
POTASSIUM: 4 mmol/L (ref 3.5–5.1)
SODIUM: 141 mmol/L (ref 135–145)

## 2017-05-17 SURGERY — ANTERIOR CERVICAL DECOMPRESSION/DISCECTOMY FUSION 2 LEVELS
Anesthesia: General

## 2017-05-17 MED ORDER — ATORVASTATIN CALCIUM 40 MG PO TABS
40.0000 mg | ORAL_TABLET | Freq: Every day | ORAL | Status: DC
  Administered 2017-05-17: 40 mg via ORAL
  Filled 2017-05-17: qty 2
  Filled 2017-05-17: qty 1

## 2017-05-17 MED ORDER — PHENOL 1.4 % MT LIQD
1.0000 | OROMUCOSAL | Status: DC | PRN
  Filled 2017-05-17: qty 177

## 2017-05-17 MED ORDER — LISINOPRIL-HYDROCHLOROTHIAZIDE 10-12.5 MG PO TABS: 1.0000 | ORAL_TABLET | Freq: Every day | ORAL | Status: DC

## 2017-05-17 MED ORDER — ONDANSETRON HCL 4 MG PO TABS: 4.0000 mg | ORAL_TABLET | Freq: Four times a day (QID) | ORAL | Status: DC | PRN

## 2017-05-17 MED ORDER — HYDROMORPHONE HCL 1 MG/ML IJ SOLN: 0.2500 mg | INTRAMUSCULAR | Status: DC | PRN

## 2017-05-17 MED ORDER — DEXAMETHASONE 4 MG PO TABS
4.0000 mg | ORAL_TABLET | Freq: Four times a day (QID) | ORAL | Status: DC
  Administered 2017-05-17 – 2017-05-18 (×2): 4 mg via ORAL
  Filled 2017-05-17 (×2): qty 1

## 2017-05-17 MED ORDER — LIDOCAINE 2% (20 MG/ML) 5 ML SYRINGE
INTRAMUSCULAR | Status: DC | PRN
  Administered 2017-05-17: 60 mg via INTRAVENOUS

## 2017-05-17 MED ORDER — SODIUM CHLORIDE 0.9 % IV SOLN: 250.0000 mL | INTRAVENOUS | Status: DC

## 2017-05-17 MED ORDER — ACETAMINOPHEN 650 MG RE SUPP: 650.0000 mg | RECTAL | Status: DC | PRN

## 2017-05-17 MED ORDER — CEFAZOLIN SODIUM-DEXTROSE 2-4 GM/100ML-% IV SOLN
INTRAVENOUS | Status: AC
  Filled 2017-05-17: qty 100

## 2017-05-17 MED ORDER — SODIUM CHLORIDE 0.9% FLUSH: 3.0000 mL | Freq: Two times a day (BID) | INTRAVENOUS | Status: DC

## 2017-05-17 MED ORDER — FENTANYL CITRATE (PF) 100 MCG/2ML IJ SOLN
INTRAMUSCULAR | Status: DC | PRN
  Administered 2017-05-17 (×2): 50 ug via INTRAVENOUS
  Administered 2017-05-17 (×2): 100 ug via INTRAVENOUS

## 2017-05-17 MED ORDER — ONDANSETRON HCL 4 MG/2ML IJ SOLN: 4.0000 mg | Freq: Four times a day (QID) | INTRAMUSCULAR | Status: DC | PRN

## 2017-05-17 MED ORDER — ALUM & MAG HYDROXIDE-SIMETH 200-200-20 MG/5ML PO SUSP: 30.0000 mL | Freq: Four times a day (QID) | ORAL | Status: DC | PRN

## 2017-05-17 MED ORDER — LISINOPRIL 20 MG PO TABS
10.0000 mg | ORAL_TABLET | Freq: Every day | ORAL | Status: DC
  Administered 2017-05-17: 10 mg via ORAL
  Filled 2017-05-17: qty 1

## 2017-05-17 MED ORDER — MENTHOL 3 MG MT LOZG: 1.0000 | LOZENGE | OROMUCOSAL | Status: DC | PRN

## 2017-05-17 MED ORDER — HYDROCHLOROTHIAZIDE 12.5 MG PO CAPS
12.5000 mg | ORAL_CAPSULE | Freq: Every day | ORAL | Status: DC
  Administered 2017-05-17: 12.5 mg via ORAL
  Filled 2017-05-17: qty 1

## 2017-05-17 MED ORDER — FENTANYL CITRATE (PF) 250 MCG/5ML IJ SOLN
INTRAMUSCULAR | Status: AC
Start: 2017-05-17 — End: 2017-05-17
  Filled 2017-05-17: qty 5

## 2017-05-17 MED ORDER — SUGAMMADEX SODIUM 200 MG/2ML IV SOLN
INTRAVENOUS | Status: DC | PRN
  Administered 2017-05-17: 127 mg via INTRAVENOUS

## 2017-05-17 MED ORDER — SODIUM CHLORIDE 0.9 % IR SOLN
Status: DC | PRN
  Administered 2017-05-17: 15:00:00

## 2017-05-17 MED ORDER — FENTANYL CITRATE (PF) 250 MCG/5ML IJ SOLN
INTRAMUSCULAR | Status: AC
  Filled 2017-05-17: qty 5

## 2017-05-17 MED ORDER — CYCLOBENZAPRINE HCL 10 MG PO TABS: 10.0000 mg | ORAL_TABLET | Freq: Three times a day (TID) | ORAL | Status: DC | PRN

## 2017-05-17 MED ORDER — ONDANSETRON HCL 4 MG/2ML IJ SOLN
INTRAMUSCULAR | Status: AC
  Filled 2017-05-17: qty 2

## 2017-05-17 MED ORDER — SODIUM CHLORIDE 0.9% FLUSH: 3.0000 mL | INTRAVENOUS | Status: DC | PRN

## 2017-05-17 MED ORDER — MUPIROCIN 2 % EX OINT
TOPICAL_OINTMENT | CUTANEOUS | Status: AC
  Filled 2017-05-17: qty 22

## 2017-05-17 MED ORDER — CEFAZOLIN SODIUM-DEXTROSE 2-4 GM/100ML-% IV SOLN
2.0000 g | Freq: Three times a day (TID) | INTRAVENOUS | Status: AC
  Administered 2017-05-17 – 2017-05-18 (×2): 2 g via INTRAVENOUS
  Filled 2017-05-17 (×2): qty 100

## 2017-05-17 MED ORDER — LACTATED RINGERS IV SOLN
INTRAVENOUS | Status: DC | PRN
Start: 2017-05-17 — End: 2017-05-17
  Administered 2017-05-17 (×2): via INTRAVENOUS

## 2017-05-17 MED ORDER — ASPIRIN 81 MG PO CHEW: 81.0000 mg | CHEWABLE_TABLET | Freq: Every day | ORAL | Status: DC

## 2017-05-17 MED ORDER — PANTOPRAZOLE SODIUM 40 MG PO TBEC: 40.0000 mg | DELAYED_RELEASE_TABLET | Freq: Every day | ORAL | Status: DC

## 2017-05-17 MED ORDER — GABAPENTIN 300 MG PO CAPS
300.0000 mg | ORAL_CAPSULE | Freq: Two times a day (BID) | ORAL | Status: DC
  Administered 2017-05-17: 300 mg via ORAL
  Filled 2017-05-17: qty 1

## 2017-05-17 MED ORDER — ACETAMINOPHEN 325 MG PO TABS: 650.0000 mg | ORAL_TABLET | ORAL | Status: DC | PRN

## 2017-05-17 MED ORDER — EPHEDRINE 5 MG/ML INJ
INTRAVENOUS | Status: AC
  Filled 2017-05-17: qty 10

## 2017-05-17 MED ORDER — DEXAMETHASONE SODIUM PHOSPHATE 4 MG/ML IJ SOLN: 4.0000 mg | Freq: Four times a day (QID) | INTRAMUSCULAR | Status: DC

## 2017-05-17 MED ORDER — ONDANSETRON HCL 4 MG/2ML IJ SOLN
INTRAMUSCULAR | Status: DC | PRN
  Administered 2017-05-17: 4 mg via INTRAVENOUS

## 2017-05-17 MED ORDER — DEXAMETHASONE SODIUM PHOSPHATE 10 MG/ML IJ SOLN
INTRAMUSCULAR | Status: DC | PRN
  Administered 2017-05-17: 10 mg via INTRAVENOUS

## 2017-05-17 MED ORDER — ROCURONIUM BROMIDE 10 MG/ML (PF) SYRINGE
PREFILLED_SYRINGE | INTRAVENOUS | Status: DC | PRN
  Administered 2017-05-17: 20 mg via INTRAVENOUS
  Administered 2017-05-17: 50 mg via INTRAVENOUS

## 2017-05-17 MED ORDER — LIDOCAINE 2% (20 MG/ML) 5 ML SYRINGE
INTRAMUSCULAR | Status: AC
  Filled 2017-05-17: qty 5

## 2017-05-17 MED ORDER — EPHEDRINE SULFATE-NACL 50-0.9 MG/10ML-% IV SOSY
PREFILLED_SYRINGE | INTRAVENOUS | Status: DC | PRN
  Administered 2017-05-17: 10 mg via INTRAVENOUS

## 2017-05-17 MED ORDER — OXYCODONE HCL 5 MG PO TABS
5.0000 mg | ORAL_TABLET | ORAL | Status: DC | PRN
  Administered 2017-05-17: 10 mg via ORAL
  Administered 2017-05-18: 5 mg via ORAL
  Filled 2017-05-17: qty 1
  Filled 2017-05-17: qty 2

## 2017-05-17 MED ORDER — METFORMIN HCL 500 MG PO TABS: 500.0000 mg | ORAL_TABLET | Freq: Two times a day (BID) | ORAL | Status: DC

## 2017-05-17 MED ORDER — THROMBIN 5000 UNITS EX SOLR
CUTANEOUS | Status: AC
  Filled 2017-05-17: qty 5000

## 2017-05-17 MED ORDER — PHENYLEPHRINE 40 MCG/ML (10ML) SYRINGE FOR IV PUSH (FOR BLOOD PRESSURE SUPPORT)
PREFILLED_SYRINGE | INTRAVENOUS | Status: AC
  Filled 2017-05-17: qty 10

## 2017-05-17 MED ORDER — THROMBIN 20000 UNITS EX SOLR
CUTANEOUS | Status: DC | PRN
  Administered 2017-05-17: 15:00:00 via TOPICAL

## 2017-05-17 MED ORDER — PANTOPRAZOLE SODIUM 40 MG IV SOLR: 40.0000 mg | Freq: Every day | INTRAVENOUS | Status: DC

## 2017-05-17 MED ORDER — HYDROMORPHONE HCL 1 MG/ML IJ SOLN: 0.5000 mg | INTRAMUSCULAR | Status: DC | PRN

## 2017-05-17 MED ORDER — LACTATED RINGERS IV SOLN
INTRAVENOUS | Status: DC
  Administered 2017-05-17: 13:00:00 via INTRAVENOUS

## 2017-05-17 MED ORDER — THROMBIN 20000 UNITS EX SOLR
CUTANEOUS | Status: AC
  Filled 2017-05-17: qty 20000

## 2017-05-17 MED ORDER — 0.9 % SODIUM CHLORIDE (POUR BTL) OPTIME
TOPICAL | Status: DC | PRN
  Administered 2017-05-17: 1000 mL

## 2017-05-17 MED ORDER — MUPIROCIN 2 % EX OINT
1.0000 "application " | TOPICAL_OINTMENT | Freq: Once | CUTANEOUS | Status: AC
  Administered 2017-05-17: 1 via TOPICAL

## 2017-05-17 MED ORDER — PHENYLEPHRINE 40 MCG/ML (10ML) SYRINGE FOR IV PUSH (FOR BLOOD PRESSURE SUPPORT)
PREFILLED_SYRINGE | INTRAVENOUS | Status: DC | PRN
  Administered 2017-05-17 (×2): 80 ug via INTRAVENOUS

## 2017-05-17 MED ORDER — THROMBIN 5000 UNITS EX SOLR
OROMUCOSAL | Status: DC | PRN
  Administered 2017-05-17: 15:00:00 via TOPICAL

## 2017-05-17 MED ORDER — SUGAMMADEX SODIUM 200 MG/2ML IV SOLN
INTRAVENOUS | Status: AC
  Filled 2017-05-17: qty 2

## 2017-05-17 MED ORDER — PROMETHAZINE HCL 25 MG/ML IJ SOLN: 6.2500 mg | INTRAMUSCULAR | Status: DC | PRN

## 2017-05-17 MED ORDER — PHENYLEPHRINE HCL 10 MG/ML IJ SOLN
INTRAVENOUS | Status: DC | PRN
  Administered 2017-05-17: 25 ug/min via INTRAVENOUS

## 2017-05-17 MED ORDER — PROPOFOL 10 MG/ML IV BOLUS
INTRAVENOUS | Status: AC
  Filled 2017-05-17: qty 20

## 2017-05-17 MED ORDER — ESCITALOPRAM OXALATE 10 MG PO TABS
10.0000 mg | ORAL_TABLET | Freq: Every day | ORAL | Status: DC
  Administered 2017-05-17: 10 mg via ORAL
  Filled 2017-05-17 (×3): qty 1

## 2017-05-17 MED ORDER — ADULT MULTIVITAMIN W/MINERALS CH: 1.0000 | ORAL_TABLET | Freq: Every day | ORAL | Status: DC

## 2017-05-17 MED ORDER — CEFAZOLIN SODIUM-DEXTROSE 2-4 GM/100ML-% IV SOLN
2.0000 g | Freq: Once | INTRAVENOUS | Status: AC
  Administered 2017-05-17: 2 g via INTRAVENOUS

## 2017-05-17 MED ORDER — ROCURONIUM BROMIDE 10 MG/ML (PF) SYRINGE
PREFILLED_SYRINGE | INTRAVENOUS | Status: AC
  Filled 2017-05-17: qty 5

## 2017-05-17 MED ORDER — PROPOFOL 10 MG/ML IV BOLUS
INTRAVENOUS | Status: DC | PRN
  Administered 2017-05-17: 140 mg via INTRAVENOUS

## 2017-05-17 SURGICAL SUPPLY — 65 items
ADH SKN CLS APL DERMABOND .7 (GAUZE/BANDAGES/DRESSINGS) ×1
ALLOGRAFT 7X14X11 (Bone Implant) ×2 IMPLANT
ALLOGRAFT CA 6X14X11 (Bone Implant) ×1 IMPLANT
ALLOGRAFT CA 6X14X11MM (Bone Implant) ×1 IMPLANT
APL SKNCLS STERI-STRIP NONHPOA (GAUZE/BANDAGES/DRESSINGS) ×1
BAG DECANTER FOR FLEXI CONT (MISCELLANEOUS) ×3 IMPLANT
BENZOIN TINCTURE PRP APPL 2/3 (GAUZE/BANDAGES/DRESSINGS) ×3 IMPLANT
BIT DRILL SPINE QC 12 (BIT) ×2 IMPLANT
BUR MATCHSTICK NEURO 3.0 LAGG (BURR) ×3 IMPLANT
CANISTER SUCT 3000ML PPV (MISCELLANEOUS) ×3 IMPLANT
CARTRIDGE OIL MAESTRO DRILL (MISCELLANEOUS) ×1 IMPLANT
CLOSURE WOUND 1/2 X4 (GAUZE/BANDAGES/DRESSINGS) ×1
DECANTER SPIKE VIAL GLASS SM (MISCELLANEOUS) ×3 IMPLANT
DERMABOND ADVANCED (GAUZE/BANDAGES/DRESSINGS) ×2
DERMABOND ADVANCED .7 DNX12 (GAUZE/BANDAGES/DRESSINGS) ×1 IMPLANT
DIFFUSER DRILL AIR PNEUMATIC (MISCELLANEOUS) ×3 IMPLANT
DRAPE C-ARM 42X72 X-RAY (DRAPES) ×6 IMPLANT
DRAPE LAPAROTOMY 100X72 PEDS (DRAPES) ×3 IMPLANT
DRAPE MICROSCOPE LEICA (MISCELLANEOUS) ×3 IMPLANT
DRAPE POUCH INSTRU U-SHP 10X18 (DRAPES) ×3 IMPLANT
DRSG OPSITE POSTOP 4X6 (GAUZE/BANDAGES/DRESSINGS) ×2 IMPLANT
DURAPREP 6ML APPLICATOR 50/CS (WOUND CARE) ×3 IMPLANT
ELECT COATED BLADE 2.86 ST (ELECTRODE) ×3 IMPLANT
ELECT REM PT RETURN 9FT ADLT (ELECTROSURGICAL) ×3
ELECTRODE REM PT RTRN 9FT ADLT (ELECTROSURGICAL) ×1 IMPLANT
GAUZE SPONGE 4X4 12PLY STRL (GAUZE/BANDAGES/DRESSINGS) ×1 IMPLANT
GAUZE SPONGE 4X4 16PLY XRAY LF (GAUZE/BANDAGES/DRESSINGS) IMPLANT
GLOVE BIO SURGEON STRL SZ7 (GLOVE) ×4 IMPLANT
GLOVE BIO SURGEON STRL SZ8 (GLOVE) ×3 IMPLANT
GLOVE BIOGEL PI IND STRL 7.0 (GLOVE) IMPLANT
GLOVE BIOGEL PI INDICATOR 7.0 (GLOVE) ×4
GLOVE EXAM NITRILE LRG STRL (GLOVE) IMPLANT
GLOVE EXAM NITRILE XL STR (GLOVE) IMPLANT
GLOVE EXAM NITRILE XS STR PU (GLOVE) IMPLANT
GLOVE INDICATOR 8.5 STRL (GLOVE) ×3 IMPLANT
GOWN STRL REUS W/ TWL LRG LVL3 (GOWN DISPOSABLE) IMPLANT
GOWN STRL REUS W/ TWL XL LVL3 (GOWN DISPOSABLE) ×1 IMPLANT
GOWN STRL REUS W/TWL 2XL LVL3 (GOWN DISPOSABLE) ×3 IMPLANT
GOWN STRL REUS W/TWL LRG LVL3 (GOWN DISPOSABLE)
GOWN STRL REUS W/TWL XL LVL3 (GOWN DISPOSABLE) ×9
HALTER HD/CHIN CERV TRACTION D (MISCELLANEOUS) ×3 IMPLANT
HEMOSTAT POWDER KIT SURGIFOAM (HEMOSTASIS) ×2 IMPLANT
KIT BASIN OR (CUSTOM PROCEDURE TRAY) ×3 IMPLANT
KIT ROOM TURNOVER OR (KITS) ×3 IMPLANT
NDL SPNL 20GX3.5 QUINCKE YW (NEEDLE) ×1 IMPLANT
NEEDLE SPNL 20GX3.5 QUINCKE YW (NEEDLE) ×3 IMPLANT
NS IRRIG 1000ML POUR BTL (IV SOLUTION) ×3 IMPLANT
OIL CARTRIDGE MAESTRO DRILL (MISCELLANEOUS) ×3
PACK LAMINECTOMY NEURO (CUSTOM PROCEDURE TRAY) ×3 IMPLANT
PIN DISTRACTION 14MM (PIN) ×4 IMPLANT
PLATE ANT CERV XTEND 2 LV 28 (Plate) ×2 IMPLANT
RUBBERBAND STERILE (MISCELLANEOUS) ×6 IMPLANT
SCREW XTD VAR 4.2 SELF TAP 12 (Screw) ×12 IMPLANT
SPONGE INTESTINAL PEANUT (DISPOSABLE) ×3 IMPLANT
SPONGE SURGIFOAM ABS GEL 100 (HEMOSTASIS) ×3 IMPLANT
STRIP CLOSURE SKIN 1/2X4 (GAUZE/BANDAGES/DRESSINGS) ×2 IMPLANT
SUT VIC AB 0 CT1 18XCR BRD8 (SUTURE) IMPLANT
SUT VIC AB 0 CT1 8-18 (SUTURE) ×3
SUT VIC AB 3-0 SH 8-18 (SUTURE) ×3 IMPLANT
SUT VICRYL 4-0 PS2 18IN ABS (SUTURE) ×3 IMPLANT
TAPE CLOTH 4X10 WHT NS (GAUZE/BANDAGES/DRESSINGS) IMPLANT
TOWEL GREEN STERILE (TOWEL DISPOSABLE) ×3 IMPLANT
TOWEL GREEN STERILE FF (TOWEL DISPOSABLE) ×3 IMPLANT
TRAP SPECIMEN MUCOUS 40CC (MISCELLANEOUS) ×1 IMPLANT
WATER STERILE IRR 1000ML POUR (IV SOLUTION) ×3 IMPLANT

## 2017-05-17 NOTE — Progress Notes (Signed)
Orthopedic Tech Progress Note Patient Details:  Monique Bauer [facility] 07/22/1936 [id number]  Ortho Devices Type of Ortho Device: Soft collar Ortho Device/Splint Location: Provided soft collar for pt.  Floor nurse applied Soft Collar to pt neck.     Kristopher Oppenheim 05/17/2017, 8:36 PM

## 2017-05-17 NOTE — Anesthesia Procedure Notes (Signed)
Procedure Name: Intubation Date/Time: 05/17/2017 3:38 PM Performed by: Everlean Cherry A Pre-anesthesia Checklist: Patient identified, Emergency Drugs available, Suction available and Patient being monitored Patient Re-evaluated:Patient Re-evaluated prior to inductionOxygen Delivery Method: Circle system utilized Preoxygenation: Pre-oxygenation with 100% oxygen Intubation Type: IV induction Ventilation: Mask ventilation without difficulty and Oral airway inserted - appropriate to patient size Laryngoscope Size: Sabra Heck and 2 Grade View: Grade I Tube type: Oral Tube size: 7.0 mm Number of attempts: 1 Placement Confirmation: ETT inserted through vocal cords under direct vision,  positive ETCO2 and breath sounds checked- equal and bilateral Secured at: 22 cm Tube secured with: Tape Dental Injury: Teeth and Oropharynx as per pre-operative assessment

## 2017-05-17 NOTE — Transfer of Care (Signed)
Immediate Anesthesia Transfer of Care Note  Patient: Monique Bauer  Procedure(s) Performed: Procedure(s): C4-5/5-6 ACDF (N/A)  Patient Location: PACU  Anesthesia Type:General  Level of Consciousness: awake, alert , oriented and patient cooperative  Airway & Oxygen Therapy: Patient Spontanous Breathing and Patient connected to nasal cannula oxygen  Post-op Assessment: Report given to RN, Post -op Vital signs reviewed and stable and Patient moving all extremities X 4  Post vital signs: Reviewed and stable  Last Vitals:  Vitals:   05/17/17 1206  BP: (!) 160/62  Pulse: 71  Resp: 18    Last Pain:  Vitals:   05/17/17 1228  TempSrc:   PainSc: 0-No pain      Patients Stated Pain Goal: 4 (54/65/68 1275)  Complications: No apparent anesthesia complications

## 2017-05-17 NOTE — Op Note (Signed)
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 Postoperative diagnosis: Same  Procedure: Anterior cervical discectomies at C4-5 and C5-6 anterior cervical corpectomy of C5 and that greater than 50% of the vertebral body was resected during the discectomies. Foraminotomies of the C5 and C6 nerve roots respectively.  #2 placement of 2 allograft wedges at C4-5 and C5-6 and anterior cervical plating utilizing the globus extend plate with 5-05 mm screws.   surgeon: Dominica Severin Annamary Buschman  Asst.: Jonni Sanger pool  Anesthesia: Gen.  EBL: Minimal  History of present illness: Patient is a very pleasant 81 year old female is a progress worsening neck pain numbness tingling weakness in arms and hands workup revealed signal change within the spinal cord and severe cord compression from a combination of discectomy disc herniations at C4-5 and C5-6 and a retrolisthesis of her C5 vertebral body. On flexion extension films listed partially reduce*recommended discectomies of C4 on C5-6 with possible corpectomy. I extensively went over the risks and benefits of the operation with the patient as well as perioperative course expectations of outcome and alternatives of surgery and she understood and agreed to proceed forward.  Operative procedure: Patient brought into the or was induced on general anesthesia positioned supine the neck in slight extension in 5 pounds of halter traction the right-sided neck was prepped and draped in routine sterile fashion preoperative x-ray localize this to be the appropriate level so a curvilinear incision was made just off midline to the intertubercular sternomastoid and super sling platysmas dissected and divided longitudinally the avascular plane to sternomastoid and strap muscles was developed down to the previously fashion preoperative fascia was dissected away with Kitners. Interoperative x-ray confirmed  with desiccation appropriate level so annulotomy was made to marked the disc space lungs close reflected laterally and self-retaining retractors were placed. The interspaces were markedly spondylitic and collapsed anterior aspect of bitten off with Leksell rongeur identify both disc spaces removed anterior calcified margins disc spaces and spurs ever overgrown the disc spaces placed distractor pins in the C5 and C6 vertebral bodies and distracted the interspace and drilled down theto the posterior annulus and osteo-complex. Under Mike's cup illumination further drilling down was carried out then I removed the posterior annulus aggressively under bit both the C5 and C6 vertebral bodies identified the posterior longitudinal ligament which was removed piecemeal fashion I did to display the thecal sac. There was a large spur coming off the C5 vertebral body this was all removed as well as partially calcified posterior annulus. After discectomy the thecal sac was widely decompressed no further stenosis either centrally or foraminally both C6 nerve roots were skeletonized flush with pedicle. Then I selected a 7 mm allograft wedges inserted at C5-6 collapsed the distractor and repeat repositioned this C4-5 and a similar fashion C4-5 was drilled down under the operating microscope and again large posterior spurs coming off the C4 and C5 vertebral bodies were all removed large spur coming off the partially calcified ligament was also removed this decompress the central canal both C5 foramina were identified both C5 nerve roots were decompressed. 6 mm allograft was inserted C4-5. Then I selected a 28 mm globus extend plate all screws excellent purchase locking mechanisms were engaged posterior fluoroscopy confirmed good position of all the implants as well as greater than 50% of the vertebral body being removed qualifying this is corpectomy. Then the wound scope was irrigated meticulous hemostasis was maintained was closed in  layers with interrupted  Vicryl running 4 subcuticular in the skin Dermabond benzo and Steri-Strips and a sterile dressing was applied patient recovered in stable condition. At the end the case all needle counts and sponge counts were correct.

## 2017-05-17 NOTE — H&P (Signed)
Monique Bauer is an 81 y.o. female.   Chief Complaint: Neck pain numbness tingling hands fingers HPI: 81 year old female with long-standing neck pain bilateral shoulder pain and arm pain hand pain with numbness tingling her fingers weakness in her hands. Workup has revealed a myelopathy clinical exam with severe cord compression at C4-5 and C5-6. There was some posterior displacement of the C5 vertebral body against the spinal cord that reduced on extension. The patient I think we can do this with 2 discectomies at C4-5 and C5-6 hours possible left due to a C5 corpectomy I've extensively gone over the risks and benefits of this procedure with the patient as well as family and they understand and agree to proceed forward.  Past Medical History:  Diagnosis Date  . Anxiety   . Cervical myelopathy ([employer])   . Diabetes ([place]) 12-2012  . Family history of adverse reaction to anesthesia    had problems waking up pt father   . GERD (gastroesophageal reflux disease)   . History of hiatal hernia   . Hyperlipidemia   . Hypertension   . Osteoarthritis   . Osteopenia   . Screening for AAA (abdominal aortic aneurysm) 11/2009   aorta U/S, no AAA    Past Surgical History:  Procedure Laterality Date  . NO PAST SURGERIES      Family History  Problem Relation Age of Onset  . Breast cancer Other        aunt   . Coronary artery disease Mother        M at age 52 and F  . Diabetes Mother        M, F, daughter, sister  . Hypertension Mother   . Stroke Father   . Asthma Sister   . Colon cancer Neg Hx    Social History:  reports that she quit smoking about 35 years ago. She has never used smokeless tobacco. She reports that she drinks alcohol. She reports that she does not use drugs.  Allergies:  Allergies  Allergen Reactions  . Sulfa Antibiotics Rash    Rash all over body   . Sulfamethoxazole-Trimethoprim Rash    rash all over    Medications Prior to Admission  Medication Sig Dispense  Refill  . aspirin 81 MG tablet Take 81 mg by mouth at bedtime.     . Calcium Carb-Cholecalciferol (CALCIUM 500+D PO) Take 1 tablet by mouth 2 (two) times daily.    Marland Kitchen escitalopram (LEXAPRO) 10 MG tablet Take 1 tablet (10 mg total) by mouth daily. 90 tablet 2  . gabapentin (NEURONTIN) 300 MG capsule Take 1 capsule (300 mg total) by mouth 2 (two) times daily. 60 capsule 3  . GLUCOSAMINE-CHONDROITIN PO Take 1 tablet by mouth daily.    Marland Kitchen glucose blood (ONETOUCH VERIO) test strip Check blood sugars once daily 100 each 12  . lisinopril-hydrochlorothiazide (PRINZIDE,ZESTORETIC) 10-12.5 MG tablet Take 1 tablet by mouth daily. 90 tablet 2  . meloxicam (MOBIC) 7.5 MG tablet Take 1 tablet (7.5 mg total) by mouth daily as needed. for pain (Patient taking differently: Take 7.5 mg by mouth daily. ) 90 tablet 0  . metFORMIN (GLUCOPHAGE) 500 MG tablet Take 1 tablet (500 mg total) by mouth 2 (two) times daily with a meal. 180 tablet 2  . Multiple Vitamin (MULTIVITAMIN WITH MINERALS) TABS tablet Take 1 tablet by mouth daily.    Glory Rosebush DELICA LANCETS FINE MISC Check blood sugars once  daily. 100 each 12  . pantoprazole (PROTONIX) 40  MG tablet Take 1 tablet (40 mg total) by mouth daily. 90 tablet 1  . simvastatin (ZOCOR) 80 MG tablet Take 1 tablet (80 mg total) by mouth at bedtime. 90 tablet 1  . HYDROcodone-acetaminophen (NORCO/VICODIN) 5-325 MG tablet Take 1-2 tablets by mouth every 8 (eight) hours as needed. (Patient not taking: Reported on 03/01/2017) 30 tablet 0  . hydrocortisone cream 1 % Apply to affected area 2 times daily (Patient not taking: Reported on 05/11/2017) 15 g 0  . nortriptyline (PAMELOR) 10 MG capsule Take 1 capsule (10 mg total) by mouth at bedtime. (Patient not taking: Reported on 05/11/2017) 30 capsule 0    Results for orders placed or performed during the hospital encounter of 05/17/17 (from the past 48 hour(s))  Basic metabolic panel     Status: Abnormal   Collection Time: 05/17/17 11:55  AM  Result Value Ref Range   Sodium 141 135 - 145 mmol/L   Potassium 4.0 3.5 - 5.1 mmol/L   Chloride 103 101 - 111 mmol/L   CO2 27 22 - 32 mmol/L   Glucose, Bld 108 (H) 65 - 99 mg/dL   BUN 15 6 - 20 mg/dL   Creatinine, Ser 0.98 0.44 - 1.00 mg/dL   Calcium 9.7 8.9 - 10.3 mg/dL   GFR calc non Af Amer 54 (L) >60 mL/min   GFR calc Af Amer >60 >60 mL/min    Comment: (NOTE) The eGFR has been calculated using the CKD EPI equation. This calculation has not been validated in all clinical situations. eGFR's persistently <60 mL/min signify possible Chronic Kidney Disease.    Anion gap 11 5 - 15  CBC     Status: None   Collection Time: 05/17/17 11:55 AM  Result Value Ref Range   WBC 7.4 4.0 - 10.5 K/uL   RBC 4.76 3.87 - 5.11 MIL/uL   Hemoglobin 13.8 12.0 - 15.0 g/dL   HCT 41.7 36.0 - 46.0 %   MCV 87.6 78.0 - 100.0 fL   MCH 29.0 26.0 - 34.0 pg   MCHC 33.1 30.0 - 36.0 g/dL   RDW 13.3 11.5 - 15.5 %   Platelets 231 150 - 400 K/uL  Glucose, capillary     Status: Abnormal   Collection Time: 05/17/17 12:11 PM  Result Value Ref Range   Glucose-Capillary 120 (H) 65 - 99 mg/dL  Surgical pcr screen     Status: None   Collection Time: 05/17/17 12:40 PM  Result Value Ref Range   MRSA, PCR NEGATIVE NEGATIVE   Staphylococcus aureus NEGATIVE NEGATIVE    Comment:        The Xpert SA Assay (FDA approved for NASAL specimens in patients over 47 years of age), is one component of a comprehensive surveillance program.  Test performance has been validated by [facility] for patients greater than or equal to 27 year old. It is not intended to diagnose infection nor to guide or monitor treatment.   Glucose, capillary     Status: None   Collection Time: 05/17/17  2:09 PM  Result Value Ref Range   Glucose-Capillary 91 65 - 99 mg/dL   No results found.  Review of Systems  Neurological: Positive for tingling, sensory change and focal weakness.  All other systems reviewed and are  negative.   Blood pressure (!) 160/62, pulse 71, resp. rate 18, height 5' (1.524 m), weight 63.5 kg (140 lb), SpO2 97 %. Physical Exam  Constitutional: She is oriented to person, place, and time. She  appears well-developed.  HENT:  Head: Normocephalic.  Eyes: Pupils are equal, round, and reactive to light.  Neck: Normal range of motion.  Respiratory: Effort normal.  GI: Soft.  Neurological: She is alert and oriented to person, place, and time. She has normal strength. GCS eye subscore is 4. GCS verbal subscore is 5. GCS motor subscore is 6.  Strength 5 out of 5 upper and lower extremities no focal deficits although there is some slight hand intrinsics weakness  Skin: Skin is warm and dry.     Assessment/Plan 81 year old female presents for an ACDF at C4-5 C5-6 possible C5 corpectomy  Yanessa Hocevar P, MD 05/17/2017, 3:13 PM

## 2017-05-17 NOTE — Anesthesia Preprocedure Evaluation (Signed)
Anesthesia Evaluation  Patient identified by MRN, date of birth, ID band Patient awake    Reviewed: Allergy & Precautions, NPO status , Patient's Chart, lab work & pertinent test results  Airway Mallampati: II  TM Distance: >3 FB Neck ROM: Limited    Dental no notable dental hx.    Pulmonary neg pulmonary ROS, former smoker,    Pulmonary exam normal breath sounds clear to auscultation       Cardiovascular hypertension, Normal cardiovascular exam Rhythm:Regular Rate:Normal     Neuro/Psych negative neurological ROS  negative psych ROS   GI/Hepatic Neg liver ROS, GERD  ,  Endo/Other  diabetes  Renal/GU negative Renal ROS  negative genitourinary   Musculoskeletal negative musculoskeletal ROS (+)   Abdominal   Peds negative pediatric ROS (+)  Hematology negative hematology ROS (+)   Anesthesia Other Findings   Reproductive/Obstetrics negative OB ROS                             Anesthesia Physical Anesthesia Plan  ASA: II  Anesthesia Plan: General   Post-op Pain Management:    Induction: Intravenous  Airway Management Planned: Oral ETT  Additional Equipment:   Intra-op Plan:   Post-operative Plan: Extubation in OR  Informed Consent: I have reviewed the patients History and Physical, chart, labs and discussed the procedure including the risks, benefits and alternatives for the proposed anesthesia with the patient or authorized representative who has indicated his/her understanding and acceptance.   Dental advisory given  Plan Discussed with: CRNA and Surgeon  Anesthesia Plan Comments:         Anesthesia Quick Evaluation

## 2017-05-18 LAB — GLUCOSE, CAPILLARY: Glucose-Capillary: 155 mg/dL — ABNORMAL HIGH (ref 65–99)

## 2017-05-18 LAB — HEMOGLOBIN A1C
HEMOGLOBIN A1C: 6.8 % — AB (ref 4.8–5.6)
Mean Plasma Glucose: 148 mg/dL

## 2017-05-18 MED ORDER — OXYCODONE HCL 5 MG PO TABS: 5.0000 mg | ORAL_TABLET | ORAL | 0 refills | Status: DC | PRN

## 2017-05-18 MED FILL — oxyCODONE HCL 5 MG TABS: 5 | 3 days supply | Qty: 30 | Fill #0

## 2017-05-18 NOTE — Discharge Instructions (Signed)
No lifting no bending no twisting no driving no riding a car unless she is coming back and forth to see me. May remove the outer dressing in 3-4 days leave the Steri-Strips on and intact. Cover the Steri-Strips with saran wrap for showers only.

## 2017-05-18 NOTE — Progress Notes (Signed)
Pt doing well. Pt and husband given D/C instructions with Rx, verbal understanding was provided. Pt's incision is clean and dry with no sign of infection. Pt's IV was removed prior to D/C. Pt D/C'd home via wheelchair @ 1030 per MD order. Pt is stable @ D/C and has no other needs at this time. Holli Humbles, RN

## 2017-05-18 NOTE — Discharge Summary (Signed)
  Physician Discharge Summary  Patient ID: Monique Bauer MRN: [id number] DOB/AGE: 79-18-1937 79 y.o.  Admit date: 05/17/2017 Discharge date: 05/18/2017  Admission Diagnoses:Cervical spondylitic myelopathy  Discharge Diagnoses:  Active Problems:   Myelopathy [facility])   Discharged Condition: good  Hospital Course: Patient is [facility] underwent anterior cervical corpectomy at C5 with discectomies at C4-5 and C5-6. Postoperatively patient did very well recovered in the floor on the floor was ambulating and voiding tolerating regular diet and noted significant improvement in numbness tingling in strength in her hands.   Consults: Significant Diagnostic Studies: Treatments: ACDF and ACCF C5 Discharge Exam: Blood pressure (!) 115/52, pulse 70, temperature 98.3 F (36.8 C), temperature source Oral, resp. rate 18, height 5' (1.524 m), weight 63.5 kg (140 lb), SpO2 94 %. Strength out of 5 wound clean dry and intact  Disposition: Home   Allergies as of 05/18/2017      Reactions   Sulfa Antibiotics Rash   Rash all over body    Sulfamethoxazole-trimethoprim Rash   rash all over      Medication List    TAKE these medications   aspirin 81 MG tablet Take 81 mg by mouth at bedtime.   CALCIUM 500+D PO Take 1 tablet by mouth 2 (two) times daily.   escitalopram 10 MG tablet Commonly known as:  LEXAPRO Take 1 tablet (10 mg total) by mouth daily.   gabapentin 300 MG capsule Commonly known as:  NEURONTIN Take 1 capsule (300 mg total) by mouth 2 (two) times daily.   GLUCOSAMINE-CHONDROITIN PO Take 1 tablet by mouth daily.   glucose blood test strip Commonly known as:  ONETOUCH VERIO Check blood sugars once daily   HYDROcodone-acetaminophen 5-325 MG tablet Commonly known as:  NORCO/VICODIN Take 1-2 tablets by mouth every 8 (eight) hours as needed.   hydrocortisone cream 1 % Apply to affected area 2 times daily   lisinopril-hydrochlorothiazide 10-12.5 MG tablet Commonly  known as:  PRINZIDE,ZESTORETIC Take 1 tablet by mouth daily.   meloxicam 7.5 MG tablet Commonly known as:  MOBIC Take 1 tablet (7.5 mg total) by mouth daily as needed. for pain What changed:  when to take this  additional instructions   metFORMIN 500 MG tablet Commonly known as:  GLUCOPHAGE Take 1 tablet (500 mg total) by mouth 2 (two) times daily with a meal.   multivitamin with minerals Tabs tablet Take 1 tablet by mouth daily.   nortriptyline 10 MG capsule Commonly known as:  PAMELOR Take 1 capsule (10 mg total) by mouth at bedtime.   ONETOUCH DELICA LANCETS FINE Misc Check blood sugars once  daily.   oxyCODONE 5 MG immediate release tablet Commonly known as:  Oxy IR/ROXICODONE Take 1-2 tablets (5-10 mg total) by mouth every 3 (three) hours as needed for breakthrough pain.   pantoprazole 40 MG tablet Commonly known as:  PROTONIX Take 1 tablet (40 mg total) by mouth daily.   simvastatin 80 MG tablet Commonly known as:  ZOCOR Take 1 tablet (80 mg total) by mouth at bedtime.      Follow-up Information    Kary Kos, MD Follow up.   Specialty:  Neurosurgery Contact information: [address] [place] [address] [phone]           Signed: Elaina Hoops 05/18/2017, 6:42 AM

## 2017-05-18 NOTE — Anesthesia Postprocedure Evaluation (Signed)
Anesthesia Post Note  Patient: Monique Bauer  Procedure(s) Performed: Procedure(s) (LRB): C4-5/5-6 ACDF (N/A)     Patient location during evaluation: PACU Anesthesia Type: General Level of consciousness: awake and alert Pain management: pain level controlled Vital Signs Assessment: post-procedure vital signs reviewed and stable Respiratory status: spontaneous breathing, nonlabored ventilation, respiratory function stable and patient connected to nasal cannula oxygen Cardiovascular status: blood pressure returned to baseline and stable Postop Assessment: no signs of nausea or vomiting Anesthetic complications: no    Last Vitals:  Vitals:   05/17/17 2333 05/18/17 0400  BP: 125/63 (!) 115/52  Pulse: 81 70  Resp: 18 18  Temp: 36.8 C 36.8 C    Last Pain:  Vitals:   05/18/17 0544  TempSrc:   PainSc: 3                  Daphane Odekirk S

## 2017-05-19 MED FILL — HYDROCODON-APAP 5-325: 5-325 | 5 days supply | Qty: 30 | Fill #0

## 2017-05-21 ENCOUNTER — Encounter (HOSPITAL_COMMUNITY): Payer: Self-pay | Admitting: Neurosurgery

## 2017-06-14 MED FILL — GABAPENTIN 300 MG CAPSULE: 300 | 30 days supply | Qty: 60 | Fill #3

## 2017-07-06 ENCOUNTER — Encounter: Payer: Self-pay | Admitting: Internal Medicine

## 2017-07-06 ENCOUNTER — Ambulatory Visit (INDEPENDENT_AMBULATORY_CARE_PROVIDER_SITE_OTHER): Payer: Medicare Other | Admitting: Internal Medicine

## 2017-07-06 VITALS — BP 142/69 | HR 64 | Temp 98.1°F | Ht 60.0 in | Wt 146.2 lb

## 2017-07-06 DIAGNOSIS — I10 Essential (primary) hypertension: Secondary | ICD-10-CM

## 2017-07-06 DIAGNOSIS — E119 Type 2 diabetes mellitus without complications: Secondary | ICD-10-CM

## 2017-07-06 MED ORDER — GABAPENTIN 600 MG PO TABS: 600.0000 mg | ORAL_TABLET | Freq: Two times a day (BID) | ORAL | 10 refills | Status: DC

## 2017-07-06 MED FILL — GABAPENTIN 600 MG TABLET: 600 | 30 days supply | Qty: 60 | Fill #0

## 2017-07-06 NOTE — Assessment & Plan Note (Signed)
DM:  Last A1c 6.8, slightly higher than usual, has not been able to exercise much. No change, continue metformin. HTN: Ambulatory BPs in the 140s lately. Last BMP satisfactory, continue Zestoretic. Radiculopathy: Status post neck surgery. Recovering well. Has developed bilateral arm numbness and tingling post surgery. To have a NCS this week. Currently taking meloxicam daily and gabapentin 300 BID. Tylenol did not help. Rec to increase gabapentin to 600 mg BID to help with paresthesias. RTC 4 months, CPX

## 2017-07-06 NOTE — Progress Notes (Signed)
Subjective:    Patient ID: Monique Bauer, female    DOB: 19-Sep-1936, 81 y.o.   MRN: [id number]  DOS:  07/06/2017 Type of visit - description : rov Interval history: Had neck surgery. Surgery went well, she has developed some issues mostly numbness in both arms distally. Currently on gabapentin and meloxicam. Tylenol didn't help. DM: Good med compliance, CBGs 112 on average HTN: Ambulatory BPs on average 140   Review of Systems Has been unable to exercise much due to recent surgery but plans to go back  Past Medical History:  Diagnosis Date  . Anxiety   . Cervical myelopathy ([place])   . Diabetes ([place]) 12-2012  . Family history of adverse reaction to anesthesia    had problems waking up pt father   . GERD (gastroesophageal reflux disease)   . History of hiatal hernia   . Hyperlipidemia   . Hypertension   . Osteoarthritis   . Osteopenia   . Screening for AAA (abdominal aortic aneurysm) 11/2009   aorta U/S, no AAA    Past Surgical History:  Procedure Laterality Date  . ANTERIOR CERVICAL DECOMP/DISCECTOMY FUSION N/A 05/17/2017   Procedure: C4-5/5-6 ACDF;  Surgeon: Kary Kos, MD;  Location: [facility];  Service: Neurosurgery;  Laterality: N/A;    Social History   Social History  . Marital status: Married    Spouse name: Coralee Pesa  . Number of children: 3  . Years of education: N/A   Occupational History  . retired    .  Retired   Social History Main Topics  . Smoking status: Former Smoker    Quit date: 10/15/1981  . Smokeless tobacco: Never Used     Comment: was a social smoker  . Alcohol use Yes     Comment: wine sometimes  . Drug use: No  . Sexual activity: No   Other Topics Concern  . Not on file   Social History Narrative    3 daughters live in [place], 5 G-children, 1 GG son, married x 3       Allergies as of 07/06/2017      Reactions   Oxycodone Hcl Itching, Rash   Sulfa Antibiotics Rash   Rash all over body    Sulfamethoxazole-trimethoprim Rash   rash all  over      Medication List       Accurate as of 07/06/17  1:32 PM. Always use your most recent med list.          aspirin 81 MG tablet Take 81 mg by mouth at bedtime.   CALCIUM 500+D PO Take 1 tablet by mouth 2 (two) times daily.   escitalopram 10 MG tablet Commonly known as:  LEXAPRO Take 1 tablet (10 mg total) by mouth daily.   gabapentin 600 MG tablet Commonly known as:  NEURONTIN Take 1 tablet (600 mg total) by mouth 2 (two) times daily.   GLUCOSAMINE-CHONDROITIN PO Take 1 tablet by mouth daily.   glucose blood test strip Commonly known as:  ONETOUCH VERIO Check blood sugars once daily   lisinopril-hydrochlorothiazide 10-12.5 MG tablet Commonly known as:  PRINZIDE,ZESTORETIC Take 1 tablet by mouth daily.   meloxicam 7.5 MG tablet Commonly known as:  MOBIC Take 1 tablet (7.5 mg total) by mouth daily as needed. for pain   metFORMIN 500 MG tablet Commonly known as:  GLUCOPHAGE Take 1 tablet (500 mg total) by mouth 2 (two) times daily with a meal.   multivitamin with minerals Tabs tablet Take 1  tablet by mouth daily.   ONETOUCH DELICA LANCETS FINE Misc Check blood sugars once  daily.   pantoprazole 40 MG tablet Commonly known as:  PROTONIX Take 1 tablet (40 mg total) by mouth daily.   simvastatin 80 MG tablet Commonly known as:  ZOCOR Take 1 tablet (80 mg total) by mouth at bedtime.          Objective:   Physical Exam BP (!) 142/69 (BP Location: Left Arm, Patient Position: Sitting, Cuff Size: Small)   Pulse 64   Temp 98.1 F (36.7 C) (Oral)   Ht 5' (1.524 m)   Wt 146 lb 4 oz (66.3 kg)   SpO2 95%   BMI 28.56 kg/m  General:   Well developed, well nourished . NAD.  HEENT:  Normocephalic . Face symmetric, atraumatic Lungs:  CTA B Normal respiratory effort, no intercostal retractions, no accessory muscle use. Heart: RRR,  no murmur.  No pretibial edema bilaterally  Skin: Not pale. Not jaundice Neurologic:  alert & oriented X3.  Speech  normal, gait appropriate for age and unassisted Psych--  Cognition and judgment appear intact.  Cooperative with normal attention span and concentration.  Behavior appropriate. No anxious or depressed appearing.      Assessment & Plan:   Assessment Diabetes 2014 HTN Hyperlipidemia Anxiety DJD GERD Osteopenia; dexa 07-2016 wnl  Ao [place] (-)  AAA 2010  PLAN:  DM:  Last A1c 6.8, slightly higher than usual, has not been able to exercise much. No change, continue metformin. HTN: Ambulatory BPs in the 140s lately. Last BMP satisfactory, continue Zestoretic. Radiculopathy: Status post neck surgery. Recovering well. Has developed bilateral arm numbness and tingling post surgery. To have a NCS this week. Currently taking meloxicam daily and gabapentin 300 BID. Tylenol did not help. Rec to increase gabapentin to 600 mg BID to help with paresthesias. RTC 4 months, CPX

## 2017-07-06 NOTE — Patient Instructions (Addendum)
   GO TO THE FRONT DESK Schedule your next appointment for a  yearly checkup in 4 months   After the nerve conduction study go back on gabapentin: 1 tablet every night for few days then one tablet twice a day

## 2017-07-06 NOTE — Progress Notes (Signed)
Pre visit review using our clinic review tool, if applicable. No additional management support is needed unless otherwise documented below in the visit note. 

## 2017-07-14 HISTORY — PX: CARPAL TUNNEL RELEASE: SHX101

## 2017-07-26 ENCOUNTER — Other Ambulatory Visit: Payer: Self-pay | Admitting: Internal Medicine

## 2017-07-28 LAB — HM MAMMOGRAPHY

## 2017-07-29 ENCOUNTER — Telehealth: Payer: Self-pay | Admitting: *Deleted

## 2017-07-29 ENCOUNTER — Encounter: Payer: Self-pay | Admitting: Internal Medicine

## 2017-07-29 NOTE — Telephone Encounter (Signed)
Received Physician Orders from [facility], forwarded to provider/SLS 08/16

## 2017-07-30 ENCOUNTER — Encounter: Payer: Self-pay | Admitting: Internal Medicine

## 2017-08-03 ENCOUNTER — Telehealth: Payer: Self-pay | Admitting: Internal Medicine

## 2017-08-03 NOTE — Telephone Encounter (Signed)
We don't have Shingrix in- Pt has Medicare Supplement, would have to get at pharmacy anyway.

## 2017-08-03 NOTE — Telephone Encounter (Signed)
Pt's spouse called in to request the shingles vac. Are vacc in? Is this okay to schedule?

## 2017-08-03 NOTE — Telephone Encounter (Signed)
Called pt's spouse back to make him aware.

## 2017-08-13 MED FILL — HYDROCODON-APAP 5-325: 5-325 | 5 days supply | Qty: 40 | Fill #0

## 2017-11-09 ENCOUNTER — Encounter: Payer: Self-pay | Admitting: Internal Medicine

## 2017-11-09 ENCOUNTER — Ambulatory Visit (INDEPENDENT_AMBULATORY_CARE_PROVIDER_SITE_OTHER): Payer: Medicare Other | Admitting: Internal Medicine

## 2017-11-09 VITALS — BP 112/72 | HR 65 | Temp 97.9°F | Resp 14 | Ht 60.0 in | Wt 143.1 lb

## 2017-11-09 DIAGNOSIS — E119 Type 2 diabetes mellitus without complications: Secondary | ICD-10-CM

## 2017-11-09 DIAGNOSIS — E785 Hyperlipidemia, unspecified: Secondary | ICD-10-CM

## 2017-11-09 DIAGNOSIS — Z0000 Encounter for general adult medical examination without abnormal findings: Secondary | ICD-10-CM | POA: Diagnosis not present

## 2017-11-09 LAB — COMPREHENSIVE METABOLIC PANEL
ALT: 15 U/L (ref 0–35)
AST: 19 U/L (ref 0–37)
Albumin: 4.1 g/dL (ref 3.5–5.2)
Alkaline Phosphatase: 64 U/L (ref 39–117)
BUN: 21 mg/dL (ref 6–23)
CHLORIDE: 102 meq/L (ref 96–112)
CO2: 30 meq/L (ref 19–32)
CREATININE: 0.99 mg/dL (ref 0.40–1.20)
Calcium: 9.7 mg/dL (ref 8.4–10.5)
GFR: 57.5 mL/min — ABNORMAL LOW (ref >60.00)
Glucose, Bld: 119 mg/dL — ABNORMAL HIGH (ref 70–99)
Potassium: 3.7 mEq/L (ref 3.5–5.1)
SODIUM: 141 meq/L (ref 135–145)
Total Bilirubin: 0.5 mg/dL (ref 0.2–1.2)
Total Protein: 7.5 g/dL (ref 6.0–8.3)

## 2017-11-09 LAB — HEMOGLOBIN A1C: Hgb A1c MFr Bld: 6.8 % — ABNORMAL HIGH (ref 4.6–6.5)

## 2017-11-09 LAB — LIPID PANEL
CHOL/HDL RATIO: 3
Cholesterol: 150 mg/dL (ref 0–200)
HDL: 49.3 mg/dL (ref >39.00)
LDL CALC: 75 mg/dL (ref 0–99)
NonHDL: 101.17
TRIGLYCERIDES: 132 mg/dL (ref 0.0–149.0)
VLDL: 26.4 mg/dL (ref 0.0–40.0)

## 2017-11-09 MED ORDER — ALPRAZOLAM 0.25 MG PO TABS: 0.2500 mg | ORAL_TABLET | Freq: Every evening | ORAL | 1 refills | Status: DC | PRN

## 2017-11-09 MED FILL — ALPRAZolam 0.25 MG TABS: 0.25 | 30 days supply | Qty: 30 | Fill #0

## 2017-11-09 NOTE — Progress Notes (Signed)
Pre visit review using our clinic review tool, if applicable. No additional management support is needed unless otherwise documented below in the visit note. 

## 2017-11-09 NOTE — Patient Instructions (Signed)
GO TO THE LAB : Get the blood work     GO TO THE FRONT DESK Schedule your next appointment for a routine checkup in 4 months  Consider a Medicare wellness exam with one of our nurses at your convenience  Flonase 2 sprays on each side of the nose for allergies

## 2017-11-09 NOTE — Assessment & Plan Note (Addendum)
-  Td 10-2009 ; Pneumonia 2015; prevnar 2016; had shingles shot ; s/p shingrix #1, plans to get #2 Had a Flu shot  Female care  All previous PAPs wnl, further screening no longer indicated per guidelines.  Asx. Last MMG --07/2017 , practices SBE: wnl CCS: Cscope 2003, cscope 03-2006 wnl, no FH, pt elected no further screening , ok per guidelines. Asx Counseled: Diet, exercise Labs: CMP, FLP, A1c

## 2017-11-09 NOTE — Progress Notes (Signed)
Subjective:    Patient ID: Monique Bauer, female    DOB: 1935-12-22, 81 y.o.   MRN: [id number]  DOS:  11/09/2017 Type of visit - description : cpx Interval history: Here for a CPX, last year had neck and CTS surgery. Still having CTS symptoms. Has not been able to exercise much, that  made her usual aches and pains slightly worse.  Denies fever chills, no weight loss or headaches. A little depressed, lost 2 of her dogs.  Review of Systems   Other than above, a 14 point review of systems is negative   Past Medical History:  Diagnosis Date  . Anxiety   . Cervical myelopathy ([place])   . Diabetes ([place]) 12-2012  . Family history of adverse reaction to anesthesia    had problems waking up pt father   . GERD (gastroesophageal reflux disease)   . History of hiatal hernia   . Hyperlipidemia   . Hypertension   . Osteoarthritis   . Osteopenia   . Screening for AAA (abdominal aortic aneurysm) 11/2009   aorta U/S, no AAA    Past Surgical History:  Procedure Laterality Date  . ANTERIOR CERVICAL DECOMP/DISCECTOMY FUSION N/A 05/17/2017   Procedure: C4-5/5-6 ACDF;  Surgeon: Kary Kos, MD;  Location: [facility];  Service: Neurosurgery;  Laterality: N/A;  . CARPAL TUNNEL RELEASE Right 07/2017    Social History   Socioeconomic History  . Marital status: Married    Spouse name: Coralee Pesa  . Number of children: 3  . Years of education: Not on file  . Highest education level: Not on file  Social Needs  . Financial resource strain: Not on file  . Food insecurity - worry: Not on file  . Food insecurity - inability: Not on file  . Transportation needs - medical: Not on file  . Transportation needs - non-medical: Not on file  Occupational History  . Occupation: retired     Fish farm manager: RETIRED  Tobacco Use  . Smoking status: Former Smoker    Last attempt to quit: 10/15/1981    Years since quitting: 36.0  . Smokeless tobacco: Never Used  . Tobacco comment: was a social smoker  Substance and  Sexual Activity  . Alcohol use: Yes    Comment: wine sometimes  . Drug use: No  . Sexual activity: No  Other Topics Concern  . Not on file  Social History Narrative   Lives w/ husband (married x 3);  3 daughters live in [place], 5 G-children, 1 GG son      Family History  Problem Relation Age of Onset  . Breast cancer Other        aunt   . Coronary artery disease Mother        M at age 34 and F  . Diabetes Mother        M, F, daughter, sister  . Hypertension Mother   . Stroke Father   . Asthma Sister   . Colon cancer Neg Hx      Allergies as of 11/09/2017      Reactions   Oxycodone Hcl Itching, Rash   Sulfa Antibiotics Rash   Rash all over body    Sulfamethoxazole-trimethoprim Rash   rash all over      Medication List        Accurate as of 11/09/17 11:59 PM. Always use your most recent med list.          ALPRAZolam 0.25 MG tablet Commonly known as:  XANAX Take 1 tablet (0.25 mg total) by mouth at bedtime as needed for sleep.   aspirin 81 MG tablet Take 81 mg by mouth at bedtime.   CALCIUM 500+D PO Take 1 tablet by mouth 2 (two) times daily.   escitalopram 10 MG tablet Commonly known as:  LEXAPRO Take 1 tablet (10 mg total) by mouth daily.   GLUCOSAMINE-CHONDROITIN PO Take 1 tablet by mouth daily.   glucose blood test strip Commonly known as:  ONETOUCH VERIO Check blood sugars once daily   lisinopril-hydrochlorothiazide 10-12.5 MG tablet Commonly known as:  PRINZIDE,ZESTORETIC Take 1 tablet by mouth daily.   meloxicam 7.5 MG tablet Commonly known as:  MOBIC Take 1 tablet (7.5 mg total) by mouth daily as needed for pain.   metFORMIN 500 MG tablet Commonly known as:  GLUCOPHAGE Take 1 tablet (500 mg total) by mouth 2 (two) times daily with a meal.   multivitamin with minerals Tabs tablet Take 1 tablet by mouth daily.   ONETOUCH DELICA LANCETS FINE Misc Check blood sugars once  daily.   pantoprazole 40 MG tablet Commonly known as:   PROTONIX Take 1 tablet (40 mg total) by mouth daily.   simvastatin 80 MG tablet Commonly known as:  ZOCOR Take 1 tablet (80 mg total) by mouth at bedtime.          Objective:   Physical Exam BP 112/72 (BP Location: Left Arm, Patient Position: Sitting, Cuff Size: Small)   Pulse 65   Temp 97.9 F (36.6 C) (Oral)   Resp 14   Ht 5' (1.524 m)   Wt 143 lb 2 oz (64.9 kg)   SpO2 96%   BMI 27.95 kg/m  General:   Well developed, well nourished . NAD.  Neck: No  thyromegaly  HEENT:  Normocephalic . Face symmetric, atraumatic Lungs:  CTA B Normal respiratory effort, no intercostal retractions, no accessory muscle use. Heart: RRR,  no murmur.  No pretibial edema bilaterally  Abdomen:  Not distended, soft, non-tender. No rebound or rigidity.   Skin: Exposed areas without rash. Not pale. Not jaundice Neurologic:  alert & oriented X3.  Speech normal, gait appropriate for age and unassisted Strength symmetric and appropriate for age.  Psych: Cognition and judgment appear intact.  Cooperative with normal attention span and concentration.  Behavior appropriate. No anxious or depressed appearing.     Assessment & Plan:  Assessment Diabetes 2014 HTN Hyperlipidemia Anxiety DJD GERD Osteopenia; dexa 07-2016 wnl  Ao [place] (-)  AAA 2010 Cervical myelopathy, s/p surgery 05-2017 CTS s/p R surgery 2018  PLAN:  DM: On metformin, check a A1c HTN: Ambulatory BPs normal, continue Zestoretic, labs. Hyperlipidemia: Continue simvastatin, checking labs. MSK: History of DJD, this year had cervical surgery and CTS surgery.  Still having some problems with CTS, will see her surgeon soon.  Plans to go back to exercise soon as she can. Osteopenia: Last DEXA 07-2016, normal, calcium vitamin D RTC 4 months

## 2017-11-10 NOTE — Assessment & Plan Note (Signed)
DM: On metformin, check a A1c HTN: Ambulatory BPs normal, continue Zestoretic, labs. Hyperlipidemia: Continue simvastatin, checking labs. MSK: History of DJD, this year had cervical surgery and CTS surgery.  Still having some problems with CTS, will see her surgeon soon.  Plans to go back to exercise soon as she can. Osteopenia: Last DEXA 07-2016, normal, calcium vitamin D RTC 4 months

## 2018-01-04 ENCOUNTER — Other Ambulatory Visit: Payer: Self-pay | Admitting: Internal Medicine

## 2018-02-07 MED FILL — ALPRAZolam 0.25 MG TABS: 0.25 | 30 days supply | Qty: 30 | Fill #1

## 2018-02-23 ENCOUNTER — Other Ambulatory Visit: Payer: Self-pay

## 2018-02-23 NOTE — Patient Outreach (Signed)
[facility]) Care Management  02/23/2018  Monique Bauer 06-Jan-1936 [id number]   Medication Adherence call to Monique Bauer patient is showing past due under [facility] Ins.on Simvastatin 80 mg spoke with patient she said she received a 90 days supply on January 23,2019 patient has medication until April/2019,patient said she is still taking 1 tablet a day.patient will order from [facility]  as soon as she needs it for next month.  [facility] Direct Dial [phone]  Fax [phone] [name]

## 2018-02-28 ENCOUNTER — Encounter: Payer: Self-pay | Admitting: Internal Medicine

## 2018-02-28 ENCOUNTER — Ambulatory Visit (INDEPENDENT_AMBULATORY_CARE_PROVIDER_SITE_OTHER): Payer: Medicare Other | Admitting: Internal Medicine

## 2018-02-28 ENCOUNTER — Other Ambulatory Visit: Payer: Self-pay

## 2018-02-28 VITALS — BP 148/80 | HR 61 | Temp 98.3°F | Resp 16 | Ht 60.0 in | Wt 143.6 lb

## 2018-02-28 DIAGNOSIS — I739 Peripheral vascular disease, unspecified: Principal | ICD-10-CM

## 2018-02-28 DIAGNOSIS — I779 Disorder of arteries and arterioles, unspecified: Secondary | ICD-10-CM | POA: Diagnosis not present

## 2018-02-28 DIAGNOSIS — M25571 Pain in right ankle and joints of right foot: Secondary | ICD-10-CM

## 2018-02-28 NOTE — Patient Instructions (Signed)
ICE  Tylenol  500 mg OTC 2 tabs a day every 8 hours as needed for pain  IBUPROFEN (Advil or Motrin) 200 mg 2 tablets every 12 hours as needed for pain. Take it sporadically  Always take it with food because may cause gastritis and ulcers.  If you notice nausea, stomach pain, change in the color of stools --->  Stop the medicine and let us know

## 2018-02-28 NOTE — Progress Notes (Signed)
Subjective:    Patient ID: Monique Bauer, female    DOB: [birth year], 82 y.o.   MRN: [id number]  DOS:  02/28/2018 Type of visit - description : Acute visit Interval history: Symptoms started 3 days ago, pain at the right anterior ankle.  The worst pain was yesterday, today is a still painful but not as much. No injury but admits that she has been trying to be more active.  Walking more.  Review of Systems  Denies fever chills Area has not been red and swollen.  Past Medical History:  Diagnosis Date  . Anxiety   . Cervical myelopathy ([place])   . Diabetes ([place]) 12-2012  . Family history of adverse reaction to anesthesia    had problems waking up pt father   . GERD (gastroesophageal reflux disease)   . History of hiatal hernia   . Hyperlipidemia   . Hypertension   . Osteoarthritis   . Osteopenia   . Screening for AAA (abdominal aortic aneurysm) 11/2009   aorta U/S, no AAA    Past Surgical History:  Procedure Laterality Date  . ANTERIOR CERVICAL DECOMP/DISCECTOMY FUSION N/A 05/17/2017   Procedure: C4-5/5-6 ACDF;  Surgeon: Kary Kos, MD;  Location: [facility];  Service: Neurosurgery;  Laterality: N/A;  . CARPAL TUNNEL RELEASE Right 07/2017    Social History   Socioeconomic History  . Marital status: Married    Spouse name: Coralee Pesa  . Number of children: 3  . Years of education: Not on file  . Highest education level: Not on file  Social Needs  . Financial resource strain: Not on file  . Food insecurity - worry: Not on file  . Food insecurity - inability: Not on file  . Transportation needs - medical: Not on file  . Transportation needs - non-medical: Not on file  Occupational History  . Occupation: retired     Fish farm manager: RETIRED  Tobacco Use  . Smoking status: Former Smoker    Last attempt to quit: 10/15/1981    Years since quitting: 36.3  . Smokeless tobacco: Never Used  . Tobacco comment: was a social smoker  Substance and Sexual Activity  . Alcohol use: Yes   Comment: wine sometimes  . Drug use: No  . Sexual activity: No  Other Topics Concern  . Not on file  Social History Narrative   Lives w/ husband (married x 3);  3 daughters live in [place], 5 G-children, 1 GG son       Allergies as of 02/28/2018      Reactions   Oxycodone Hcl Itching, Rash   Sulfa Antibiotics Rash   Rash all over body    Sulfamethoxazole-trimethoprim Rash   rash all over      Medication List        Accurate as of 02/28/18 11:14 AM. Always use your most recent med list.          ALPRAZolam 0.25 MG tablet Commonly known as:  XANAX Take 1 tablet (0.25 mg total) by mouth at bedtime as needed for sleep.   aspirin 81 MG tablet Take 81 mg by mouth at bedtime.   CALCIUM 500+D PO Take 1 tablet by mouth 2 (two) times daily.   escitalopram 10 MG tablet Commonly known as:  LEXAPRO Take 1 tablet (10 mg total) by mouth daily.   GLUCOSAMINE-CHONDROITIN PO Take 1 tablet by mouth daily.   glucose blood test strip Commonly known as:  ONETOUCH VERIO Check blood sugars once daily   lisinopril-hydrochlorothiazide  10-12.5 MG tablet Commonly known as:  PRINZIDE,ZESTORETIC TAKE 1 TABLET BY MOUTH  DAILY   meloxicam 7.5 MG tablet Commonly known as:  MOBIC Take 1 tablet (7.5 mg total) by mouth daily as needed. for pain   metFORMIN 500 MG tablet Commonly known as:  GLUCOPHAGE Take 1 tablet (500 mg total) by mouth 2 (two) times daily with a meal.   multivitamin with minerals Tabs tablet Take 1 tablet by mouth daily.   ONETOUCH DELICA LANCETS FINE Misc Check blood sugars once  daily.   pantoprazole 40 MG tablet Commonly known as:  PROTONIX Take 1 tablet (40 mg total) by mouth daily.   simvastatin 80 MG tablet Commonly known as:  ZOCOR Take 1 tablet (80 mg total) by mouth at bedtime.          Objective:   Physical Exam  Musculoskeletal:       Feet:   BP (!) 148/80 (BP Location: Right Arm, Patient Position: Sitting, Cuff Size: Small)   Pulse 61   Temp  98.3 F (36.8 C) (Oral)   Resp 16   Ht 5' (1.524 m)   Wt 143 lb 9.6 oz (65.1 kg)   SpO2 96%   BMI 28.04 kg/m  General:   Well developed, well nourished . NAD.  HEENT:  Normocephalic . Face symmetric, atraumatic Lower extremities: Left leg and ankle normal Right leg: No redness, swelling, warmness.  Good pedal pulses.  See graphic Skin: Not pale. Not jaundice Neurologic:  alert & oriented X3.  Speech normal, gait mildly difficult it by ankle pain Psych--  Cognition and judgment appear intact.  Cooperative with normal attention span and concentration.  Behavior appropriate. No anxious or depressed appearing.      Assessment & Plan:    Assessment Diabetes 2014 HTN Hyperlipidemia Anxiety DJD GERD Osteopenia; dexa 07-2016 wnl  Ao [place] (-)  AAA 2010 Cervical myelopathy, s/p surgery 05-2017 CTS s/p R surgery 2018  PLAN:  Ankle pain: Suspect tendinitis probably from increasing walking.  Rec conservative treatment with Ace wrap, ice, Tylenol and sporadic Motrin.  GI precautions discussed.  Call if not better. Going forward and to stay active consider a stationary bike to prevent overuse pains.  Also, due for a carotid ultrasound, will arrange

## 2018-03-01 NOTE — Assessment & Plan Note (Addendum)
Ankle pain: Suspect tendinitis probably from increasing walking.  Rec conservative treatment with Ace wrap, ice, Tylenol and sporadic Motrin.  GI precautions discussed.  Call if not better. Going forward and to stay active consider a stationary bike to prevent overuse pains.  Also, due for a carotid ultrasound, will arrange

## 2018-03-02 ENCOUNTER — Telehealth: Payer: Self-pay

## 2018-03-02 DIAGNOSIS — M25571 Pain in right ankle and joints of right foot: Secondary | ICD-10-CM

## 2018-03-02 NOTE — Telephone Encounter (Signed)
Pt. Called again today,  Not sure about the referral, it was to have ultra sound and has a question about that.  She says her right ankle is swollen more and painful.  Can not walk on it at all.  She has been putting ice on it and elevating it.   Please call her.

## 2018-03-02 NOTE — Telephone Encounter (Signed)
Please advise- as I have been out of the office the last 2 days.

## 2018-03-02 NOTE — Telephone Encounter (Signed)
Copied from [place]. Topic: Referral - Question >> Mar 01, 2018  8:08 AM Arletha Grippe wrote: Reason for CRM: pt called - she got a call about carotid artery and does not know why the doctor put in this order.  She has not yet made the appt, but is wondering how this is related to her ankle.  Cb is [phone]

## 2018-03-02 NOTE — Telephone Encounter (Signed)
Advised patient: She is due for routine carotid ultrasound, I placed the order, okay to proceed with the [place] @ her convenience. Arrange for sports medicine referral for ankle pain.

## 2018-03-02 NOTE — Telephone Encounter (Signed)
Spoke w/ Pt- informed that we have placed sports medicine referral, continue icing and elevating ankle until appt. She has scheduled appt for carotid ultrasound follow-up.

## 2018-03-03 ENCOUNTER — Encounter: Payer: Self-pay | Admitting: Family Medicine

## 2018-03-03 ENCOUNTER — Ambulatory Visit (HOSPITAL_BASED_OUTPATIENT_CLINIC_OR_DEPARTMENT_OTHER)
Admission: RE | Admit: 2018-03-03 | Discharge: 2018-03-03 | Disposition: A | Discharge status: Home / Self Care | Payer: Medicare Other | Source: Ambulatory Visit | Attending: Internal Medicine | Admitting: Internal Medicine

## 2018-03-03 ENCOUNTER — Ambulatory Visit (INDEPENDENT_AMBULATORY_CARE_PROVIDER_SITE_OTHER): Payer: Medicare Other | Admitting: Family Medicine

## 2018-03-03 DIAGNOSIS — M25571 Pain in right ankle and joints of right foot: Secondary | ICD-10-CM | POA: Diagnosis not present

## 2018-03-03 DIAGNOSIS — I779 Disorder of arteries and arterioles, unspecified: Secondary | ICD-10-CM | POA: Diagnosis present

## 2018-03-03 DIAGNOSIS — I739 Peripheral vascular disease, unspecified: Secondary | ICD-10-CM

## 2018-03-03 MED ORDER — CEPHALEXIN 500 MG PO CAPS: 500.0000 mg | ORAL_CAPSULE | Freq: Four times a day (QID) | ORAL | 0 refills | Status: DC

## 2018-03-03 MED FILL — CEPHALEXIN 500 MG CAPSULE: 500 | 7 days supply | Qty: 28 | Fill #0

## 2018-03-03 NOTE — Patient Instructions (Addendum)
Your ultrasound is reassuring - you have superficial swelling, redness, and increased neovascularity consistent with cellulitis.  I don't see fluid in the joint to suggest a gout flare and no irregularity of the bone or tendons on the outside to suggest stress fracture or tendinitis. Start keflex 500mg  4 times a day for 7 days. Aleve 2 tablets twice a day with food. Icing 15 minutes at a time 3-4 times a day as needed. Consider a Cam walker when up and walking around. Elevate above the level of your heart as needed for swelling. Compression sleeve or Ace wrap may help with the swelling as well if you can tolerate this. If you develop a fever above 100.4, increased redness above her tattoo, follow-up with me sooner otherwise follow-up with me in 1 week for reevaluation.

## 2018-03-05 ENCOUNTER — Encounter: Payer: Self-pay | Admitting: Family Medicine

## 2018-03-05 DIAGNOSIS — M25571 Pain in right ankle and joints of right foot: Secondary | ICD-10-CM | POA: Insufficient documentation

## 2018-03-05 NOTE — Progress Notes (Signed)
PCP: Colon Branch, MD  Subjective:   HPI: Patient is a 82 y.o. female here for right ankle pain.  Patient denies known injury or trauma. She states that she started to get lateral right ankle pain last Thursday. She has been doing a lot of walking on a treadmill but does not recall this directly leading to her pain. She states the pain is gotten up to a 10 out of 10 with walking. Has since developed redness and swelling laterally. She does have a history of cellulitis previously. She has been icing twice a day and taking Advil twice a day 400 mg. No history of gout. No numbness.  Past Medical History:  Diagnosis Date  . Anxiety   . Cervical myelopathy ([place])   . Diabetes ([place]) 12-2012  . Family history of adverse reaction to anesthesia    had problems waking up pt father   . GERD (gastroesophageal reflux disease)   . History of hiatal hernia   . Hyperlipidemia   . Hypertension   . Osteoarthritis   . Osteopenia   . Screening for AAA (abdominal aortic aneurysm) 11/2009   aorta U/S, no AAA    Current Outpatient Medications on File Prior to Visit  Medication Sig Dispense Refill  . ALPRAZolam (XANAX) 0.25 MG tablet Take 1 tablet (0.25 mg total) by mouth at bedtime as needed for sleep. 30 tablet 1  . aspirin 81 MG tablet Take 81 mg by mouth at bedtime.     . Calcium Carb-Cholecalciferol (CALCIUM 500+D PO) Take 1 tablet by mouth 2 (two) times daily.    Marland Kitchen escitalopram (LEXAPRO) 10 MG tablet Take 1 tablet (10 mg total) by mouth daily. 90 tablet 1  . GLUCOSAMINE-CHONDROITIN PO Take 1 tablet by mouth daily.    Marland Kitchen glucose blood (ONETOUCH VERIO) test strip Check blood sugars once daily 100 each 12  . lisinopril-hydrochlorothiazide (PRINZIDE,ZESTORETIC) 10-12.5 MG tablet TAKE 1 TABLET BY MOUTH  DAILY 90 tablet 1  . meloxicam (MOBIC) 7.5 MG tablet Take 1 tablet (7.5 mg total) by mouth daily as needed. for pain 90 tablet 1  . metFORMIN (GLUCOPHAGE) 500 MG tablet Take 1 tablet (500 mg total)  by mouth 2 (two) times daily with a meal. 180 tablet 1  . Multiple Vitamin (MULTIVITAMIN WITH MINERALS) TABS tablet Take 1 tablet by mouth daily.    Glory Rosebush DELICA LANCETS FINE MISC Check blood sugars once  daily. 100 each 12  . pantoprazole (PROTONIX) 40 MG tablet Take 1 tablet (40 mg total) by mouth daily. 90 tablet 1  . simvastatin (ZOCOR) 80 MG tablet Take 1 tablet (80 mg total) by mouth at bedtime. 90 tablet 1   No current facility-administered medications on file prior to visit.     Past Surgical History:  Procedure Laterality Date  . ANTERIOR CERVICAL DECOMP/DISCECTOMY FUSION N/A 05/17/2017   Procedure: C4-5/5-6 ACDF;  Surgeon: Kary Kos, MD;  Location: [facility];  Service: Neurosurgery;  Laterality: N/A;  . CARPAL TUNNEL RELEASE Right 07/2017    Allergies  Allergen Reactions  . Oxycodone Hcl Itching and Rash  . Sulfa Antibiotics Rash    Rash all over body   . Sulfamethoxazole-Trimethoprim Rash    rash all over    Social History   Socioeconomic History  . Marital status: Married    Spouse name: Coralee Pesa  . Number of children: 3  . Years of education: Not on file  . Highest education level: Not on file  Occupational History  . Occupation:  retired     Fish farm manager: RETIRED  Social Needs  . Financial resource strain: Not on file  . Food insecurity:    Worry: Not on file    Inability: Not on file  . Transportation needs:    Medical: Not on file    Non-medical: Not on file  Tobacco Use  . Smoking status: Former Smoker    Last attempt to quit: 10/15/1981    Years since quitting: 36.4  . Smokeless tobacco: Never Used  . Tobacco comment: was a social smoker  Substance and Sexual Activity  . Alcohol use: Yes    Comment: wine sometimes  . Drug use: No  . Sexual activity: Never  Lifestyle  . Physical activity:    Days per week: Not on file    Minutes per session: Not on file  . Stress: Not on file  Relationships  . Social connections:    Talks on phone: Not on file     Gets together: Not on file    Attends religious service: Not on file    Active member of club or organization: Not on file    Attends meetings of clubs or organizations: Not on file    Relationship status: Not on file  . Intimate partner violence:    Fear of current or ex partner: Not on file    Emotionally abused: Not on file    Physically abused: Not on file    Forced sexual activity: Not on file  Other Topics Concern  . Not on file  Social History Narrative   Lives w/ husband (married x 3);  3 daughters live in [place], 5 G-children, 1 GG son     Family History  Problem Relation Age of Onset  . Breast cancer Other        aunt   . Coronary artery disease Mother        M at age 30 and F  . Diabetes Mother        M, F, daughter, sister  . Hypertension Mother   . Stroke Father   . Asthma Sister   . Colon cancer Neg Hx     BP 137/73   Pulse 72   Ht 5' (1.524 m)   Wt 143 lb (64.9 kg)   BMI 27.93 kg/m   Review of Systems: See HPI above.     Objective:  Physical Exam:  Gen: NAD, comfortable in exam room  Right ankle: Redness and swelling over distal fibula.  No ecchymoses.  No other deformity. FROM with pain on IR and ER. TTP lateral lower leg including peroneal tendons and distal fibula.  No base of fifth, medial malleolus, navicular, ant ankle joint, other tenderness. Negative ant drawer and talar tilt.   Negative syndesmotic compression. Thompsons test negative. NV intact distally.  Left ankle: No deformity. FROM with 5/5 strength. No tenderness to palpation. NVI distally.  MSK u/s: No evidence of ankle effusion.  Soft tissue swelling over the lateral distal lower leg.  No cortical irregularities of the distal fibula or edema overlying the cortex.  Noted significant soft tissue neovascularity but none overlying the cortex of the fibula.  Peroneal tendons are intact without evidence of tendinopathy or tenosynovitis.  Assessment & Plan:  1. Right ankle pain  -consistent with developing cellulitis.  We discussed that sometimes within the first 2 weeks of symptoms musculoskeletal ultrasound can be falsely negative for a stress fracture.  There is no evidence of gout and her pain does  not seem to be localized to the ankle joint but rather to the lateral lower leg.  She will start Keflex 500 mg 4 times a day for 7 days.  Aleve 2 tablets twice a day with food.  Icing.  Consider a Cam walker when up and walking around.  Elevate above the level of her heart when possible.  Compression sleeve or Ace wrap if tolerated to help with swelling as well.  We discussed red flags.  Follow-up with me in 1 week but call me sooner if she is to develop any of these.

## 2018-03-05 NOTE — Assessment & Plan Note (Signed)
consistent with developing cellulitis.  We discussed that sometimes within the first 2 weeks of symptoms musculoskeletal ultrasound can be falsely negative for a stress fracture.  There is no evidence of gout and her pain does not seem to be localized to the ankle joint but rather to the lateral lower leg.  She will start Keflex 500 mg 4 times a day for 7 days.  Aleve 2 tablets twice a day with food.  Icing.  Consider a Cam walker when up and walking around.  Elevate above the level of her heart when possible.  Compression sleeve or Ace wrap if tolerated to help with swelling as well.  We discussed red flags.  Follow-up with me in 1 week but call me sooner if she is to develop any of these.

## 2018-03-10 ENCOUNTER — Ambulatory Visit: Payer: Medicare Other

## 2018-03-10 ENCOUNTER — Encounter: Payer: Self-pay | Admitting: Family Medicine

## 2018-03-10 ENCOUNTER — Ambulatory Visit (INDEPENDENT_AMBULATORY_CARE_PROVIDER_SITE_OTHER): Payer: Medicare Other | Admitting: Family Medicine

## 2018-03-10 ENCOUNTER — Ambulatory Visit: Payer: Medicare Other | Admitting: Internal Medicine

## 2018-03-10 DIAGNOSIS — M25571 Pain in right ankle and joints of right foot: Secondary | ICD-10-CM

## 2018-03-10 NOTE — Patient Instructions (Signed)
It's possible you have a stress reaction of the distal fibula in addition to the cellulitis. Your ultrasound doesn't show a bony irregularity, only swelling and some increased blood flow in the area. This would be expected to improve after 6 weeks. Wear the boot or comfortable shoes with arch support. No flat shoes, barefoot walking. Icing 15 minutes at a time 3-4 times a day. Elevation above your heart when possible. Follow up with me in 2 weeks for reevaluation.

## 2018-03-11 ENCOUNTER — Encounter: Payer: Self-pay | Admitting: Family Medicine

## 2018-03-11 NOTE — Progress Notes (Addendum)
Subjective:   Monique Bauer is a 82 y.o. female who presents for Medicare Annual (Subsequent) preventive examination.  Review of Systems: No ROS.  Medicare Wellness Visit. Additional risk factors are reflected in the social history.  Cardiac Risk Factors include: advanced age (>36men, >3 women);diabetes mellitus;dyslipidemia;hypertension;obesity (BMI >30kg/m2) Sleep patterns:  Sleeps about 8 hrs. Feels rested. Naps daily Home Safety/Smoke Alarms: Feels safe in home. Smoke alarms in place.  Living environment; residence and Firearm Safety: no stairs. Lives with husband 2 dogs. Seat Belt Safety/Bike Helmet: Wears seat belt.   Female:   Pap- n/a      Mammo- last 07/29/17-Category 2 benign       Dexa scan- 07/17/16   normal    CCS- pt declines    Objective:     Vitals: BP 130/60 (BP Location: Left Arm, Patient Position: Sitting, Cuff Size: Normal)   Pulse 65   Ht 5\' 5"  (1.651 m)   Wt 147 lb 3.2 oz (66.8 kg)   SpO2 97%   BMI 24.50 kg/m   Body mass index is 24.5 kg/m.  Advanced Directives 03/14/2018 05/17/2017 03/01/2017 01/07/2017 08/24/2015  Does Patient Have a Medical Advance Directive? Yes Yes Yes Yes No  Type of Paramedic of [place];Living will [place];Living will [place];Living will Living will;Healthcare Power of Attorney -  Does patient want to make changes to medical advance directive? - No - Patient declined No - Patient declined - -  Copy of [place] in Chart? Yes Yes Yes Yes -  Would patient like information on creating a medical advance directive? - - - - No - patient declined information    Tobacco Social History   Tobacco Use  Smoking Status Former Smoker  . Last attempt to quit: 10/15/1981  . Years since quitting: 36.4  Smokeless Tobacco Never Used  Tobacco Comment   was a social smoker     Counseling given: Not Answered Comment: was a social smoker   Clinical  Intake: Pain : No/denies pain     Past Medical History:  Diagnosis Date  . Anxiety   . Cervical myelopathy ([place])   . Diabetes ([place]) 12-2012  . Family history of adverse reaction to anesthesia    had problems waking up pt father   . GERD (gastroesophageal reflux disease)   . History of hiatal hernia   . Hyperlipidemia   . Hypertension   . Osteoarthritis   . Osteopenia   . Screening for AAA (abdominal aortic aneurysm) 11/2009   aorta U/S, no AAA   Past Surgical History:  Procedure Laterality Date  . ANTERIOR CERVICAL DECOMP/DISCECTOMY FUSION N/A 05/17/2017   Procedure: C4-5/5-6 ACDF;  Surgeon: Kary Kos, MD;  Location: [facility];  Service: Neurosurgery;  Laterality: N/A;  . CARPAL TUNNEL RELEASE Right 07/2017   Family History  Problem Relation Age of Onset  . Breast cancer Other        aunt   . Coronary artery disease Mother        M at age 33 and F  . Diabetes Mother        M, F, daughter, sister  . Hypertension Mother   . Stroke Father   . Asthma Sister   . Colon cancer Neg Hx    Social History   Socioeconomic History  . Marital status: Married    Spouse name: Coralee Pesa  . Number of children: 3  . Years of education: Not on  file  . Highest education level: Not on file  Occupational History  . Occupation: retired     Fish farm manager: RETIRED  Social Needs  . Financial resource strain: Not on file  . Food insecurity:    Worry: Not on file    Inability: Not on file  . Transportation needs:    Medical: Not on file    Non-medical: Not on file  Tobacco Use  . Smoking status: Former Smoker    Last attempt to quit: 10/15/1981    Years since quitting: 36.4  . Smokeless tobacco: Never Used  . Tobacco comment: was a social smoker  Substance and Sexual Activity  . Alcohol use: Yes    Comment: wine sometimes  . Drug use: No  . Sexual activity: Never  Lifestyle  . Physical activity:    Days per week: Not on file    Minutes per session: Not on file  . Stress: Not on file   Relationships  . Social connections:    Talks on phone: Not on file    Gets together: Not on file    Attends religious service: Not on file    Active member of club or organization: Not on file    Attends meetings of clubs or organizations: Not on file    Relationship status: Not on file  Other Topics Concern  . Not on file  Social History Narrative   Lives w/ husband (married x 3);  3 daughters live in [place], 5 G-children, 1 GG son     Outpatient Encounter Medications as of 03/14/2018  Medication Sig  . aspirin 81 MG tablet Take 81 mg by mouth at bedtime.   . Calcium Carb-Cholecalciferol (CALCIUM 500+D PO) Take 1 tablet by mouth 2 (two) times daily.  Marland Kitchen escitalopram (LEXAPRO) 10 MG tablet Take 1 tablet (10 mg total) by mouth daily.  Marland Kitchen GLUCOSAMINE-CHONDROITIN PO Take 1 tablet by mouth daily.  Marland Kitchen glucose blood (ONETOUCH VERIO) test strip Check blood sugars once daily  . lisinopril-hydrochlorothiazide (PRINZIDE,ZESTORETIC) 10-12.5 MG tablet TAKE 1 TABLET BY MOUTH  DAILY  . meloxicam (MOBIC) 7.5 MG tablet Take 1 tablet (7.5 mg total) by mouth daily as needed. for pain  . metFORMIN (GLUCOPHAGE) 500 MG tablet Take 1 tablet (500 mg total) by mouth 2 (two) times daily with a meal.  . Multiple Vitamin (MULTIVITAMIN WITH MINERALS) TABS tablet Take 1 tablet by mouth daily.  Glory Rosebush DELICA LANCETS FINE MISC Check blood sugars once  daily.  . pantoprazole (PROTONIX) 40 MG tablet Take 1 tablet (40 mg total) by mouth daily.  . simvastatin (ZOCOR) 80 MG tablet Take 1 tablet (80 mg total) by mouth at bedtime.  . ALPRAZolam (XANAX) 0.25 MG tablet Take 1 tablet (0.25 mg total) by mouth at bedtime as needed for sleep. (Patient not taking: Reported on 03/14/2018)  . [DISCONTINUED] cephALEXin (KEFLEX) 500 MG capsule Take 1 capsule (500 mg total) by mouth 4 (four) times daily.   No facility-administered encounter medications on file as of 03/14/2018.     Activities of Daily Living In your present state of  health, do you have any difficulty performing the following activities: 03/14/2018 05/17/2017  Hearing? N N  Vision? N N  Comment wearing glasses. Dr.Stonecipher yearly -  Difficulty concentrating or making decisions? N N  Walking or climbing stairs? N N  Dressing or bathing? N N  Doing errands, shopping? N -  Preparing Food and eating ? N -  Using the Toilet? N -  In the past six months, have you accidently leaked urine? Y -  Comment wears pads. -  Do you have problems with loss of bowel control? N -  Managing your Medications? N -  Managing your Finances? N -  Housekeeping or managing your Housekeeping? N -  Some recent data might be hidden    Patient Care Team: Colon Branch, MD as PCP - General Vevelyn Royals, MD as Consulting Physician (Ophthalmology) Kary Kos, MD as Consulting Physician (Neurosurgery)    Assessment:   This is a routine wellness examination for [facility]. Physical assessment deferred to PCP.   Exercise Activities and Dietary recommendations Current Exercise Habits: Home exercise routine, Type of exercise: walking;strength training/weights, Time (Minutes): 60, Frequency (Times/Week): 3, Weekly Exercise (Minutes/Week): 180, Intensity: Mild   Diet (meal preparation, eat out, water intake, caffeinated beverages, dairy products, fruits and vegetables): in general, a "healthy" diet  , well balanced   Goals    . Weight (lb) < 130 lb (59 kg)     Pt in weight loss support group weekly.       Fall Risk Fall Risk  03/14/2018 02/28/2018 01/07/2017 09/03/2016 03/31/2016  Falls in the past year? No No No No No     Depression Screen PHQ 2/9 Scores 03/14/2018 02/28/2018 01/07/2017 09/03/2016  PHQ - 2 Score 0 0 0 0     Cognitive Function MMSE - Mini Mental State Exam 03/14/2018 01/07/2017  Orientation to time 5 5  Orientation to Place 5 5  Registration 3 3  Attention/ Calculation 5 5  Recall 3 3  Language- name 2 objects 2 2  Language- repeat 1 1  Language- follow 3  step command 3 3  Language- read & follow direction 1 1  Write a sentence 1 1  Copy design 1 1  Total score 30 30        Immunization History  Administered Date(s) Administered  . Influenza Split 09/18/2011, 09/14/2012  . Influenza Whole 09/28/2007, 10/03/2008, 09/11/2009, 09/16/2010  . Influenza, High Dose Seasonal PF 09/11/2015, 09/03/2016  . Influenza,inj,Quad PF,6+ Mos 09/06/2013, 09/05/2014  . Influenza-Unspecified 08/04/2017  . Pneumococcal Conjugate-13 04/19/2015  . Pneumococcal Polysaccharide-23 12/14/2005, 01/11/2014  . Td 12/14/1998, 11/11/2009  . Zoster 01/25/2008  . Zoster Recombinat (Shingrix) 12/13/2017    Screening Tests Health Maintenance  Topic Date Due  . OPHTHALMOLOGY EXAM  02/25/2017  . FOOT EXAM  03/31/2017  . HEMOGLOBIN A1C  05/09/2018  . INFLUENZA VACCINE  07/14/2018  . MAMMOGRAM  07/28/2018  . TETANUS/TDAP  11/12/2019  . DEXA SCAN  Completed  . PNA vac Low Risk Adult  Completed       Plan:   Follow up with Dr.Paz today as scheduled  Continue to eat heart healthy diet (full of fruits, vegetables, whole grains, lean protein, water--limit salt, fat, and sugar intake) and increase physical activity as tolerated.  Continue doing brain stimulating activities (puzzles, reading, adult coloring books, staying active) to keep memory sharp.     I have personally reviewed and noted the following in the patient's chart:   . Medical and social history . Use of alcohol, tobacco or illicit drugs  . Current medications and supplements . Functional ability and status . Nutritional status . Physical activity . Advanced directives . List of other physicians . Hospitalizations, surgeries, and ER visits in previous 12 months . Vitals . Screenings to include cognitive, depression, and falls . Referrals and appointments  In addition, I have reviewed and discussed with patient certain  preventive protocols, quality metrics, and best practice recommendations.  A written personalized care plan for preventive services as well as general preventive health recommendations were provided to patient.     Naaman Plummer [place], [place]  03/14/2018   Kathlene November, MD

## 2018-03-11 NOTE — Progress Notes (Signed)
PCP: Colon Branch, MD  Subjective:   HPI: Patient is a 82 y.o. female here for right ankle pain.  3/21: Patient denies known injury or trauma. She states that she started to get lateral right ankle pain last Thursday. She has been doing a lot of walking on a treadmill but does not recall this directly leading to her pain. She states the pain is gotten up to a 10 out of 10 with walking. Has since developed redness and swelling laterally. She does have a history of cellulitis previously. She has been icing twice a day and taking Advil twice a day 400 mg. No history of gout. No numbness.  3/28: Patient reports she's doing much better. Pain level now 4/10 still lateral right ankle. Swelling improved as well as redness. Does get throbbing here still. Worse with ambulation, better with elevation. Finished keflex. Taking aleve too which helps. No new skin changes, numbness.  Past Medical History:  Diagnosis Date  . Anxiety   . Cervical myelopathy ([place])   . Diabetes ([place]) 12-2012  . Family history of adverse reaction to anesthesia    had problems waking up pt father   . GERD (gastroesophageal reflux disease)   . History of hiatal hernia   . Hyperlipidemia   . Hypertension   . Osteoarthritis   . Osteopenia   . Screening for AAA (abdominal aortic aneurysm) 11/2009   aorta U/S, no AAA    Current Outpatient Medications on File Prior to Visit  Medication Sig Dispense Refill  . ALPRAZolam (XANAX) 0.25 MG tablet Take 1 tablet (0.25 mg total) by mouth at bedtime as needed for sleep. 30 tablet 1  . aspirin 81 MG tablet Take 81 mg by mouth at bedtime.     . Calcium Carb-Cholecalciferol (CALCIUM 500+D PO) Take 1 tablet by mouth 2 (two) times daily.    . cephALEXin (KEFLEX) 500 MG capsule Take 1 capsule (500 mg total) by mouth 4 (four) times daily. 28 capsule 0  . escitalopram (LEXAPRO) 10 MG tablet Take 1 tablet (10 mg total) by mouth daily. 90 tablet 1  . GLUCOSAMINE-CHONDROITIN PO  Take 1 tablet by mouth daily.    Marland Kitchen glucose blood (ONETOUCH VERIO) test strip Check blood sugars once daily 100 each 12  . lisinopril-hydrochlorothiazide (PRINZIDE,ZESTORETIC) 10-12.5 MG tablet TAKE 1 TABLET BY MOUTH  DAILY 90 tablet 1  . meloxicam (MOBIC) 7.5 MG tablet Take 1 tablet (7.5 mg total) by mouth daily as needed. for pain 90 tablet 1  . metFORMIN (GLUCOPHAGE) 500 MG tablet Take 1 tablet (500 mg total) by mouth 2 (two) times daily with a meal. 180 tablet 1  . Multiple Vitamin (MULTIVITAMIN WITH MINERALS) TABS tablet Take 1 tablet by mouth daily.    Glory Rosebush DELICA LANCETS FINE MISC Check blood sugars once  daily. 100 each 12  . pantoprazole (PROTONIX) 40 MG tablet Take 1 tablet (40 mg total) by mouth daily. 90 tablet 1  . simvastatin (ZOCOR) 80 MG tablet Take 1 tablet (80 mg total) by mouth at bedtime. 90 tablet 1   No current facility-administered medications on file prior to visit.     Past Surgical History:  Procedure Laterality Date  . ANTERIOR CERVICAL DECOMP/DISCECTOMY FUSION N/A 05/17/2017   Procedure: C4-5/5-6 ACDF;  Surgeon: Kary Kos, MD;  Location: [facility];  Service: Neurosurgery;  Laterality: N/A;  . CARPAL TUNNEL RELEASE Right 07/2017    Allergies  Allergen Reactions  . Oxycodone Hcl Itching and Rash  . Sulfa  Antibiotics Rash    Rash all over body   . Sulfamethoxazole-Trimethoprim Rash    rash all over    Social History   Socioeconomic History  . Marital status: Married    Spouse name: Coralee Pesa  . Number of children: 3  . Years of education: Not on file  . Highest education level: Not on file  Occupational History  . Occupation: retired     Fish farm manager: RETIRED  Social Needs  . Financial resource strain: Not on file  . Food insecurity:    Worry: Not on file    Inability: Not on file  . Transportation needs:    Medical: Not on file    Non-medical: Not on file  Tobacco Use  . Smoking status: Former Smoker    Last attempt to quit: 10/15/1981    Years  since quitting: 36.4  . Smokeless tobacco: Never Used  . Tobacco comment: was a social smoker  Substance and Sexual Activity  . Alcohol use: Yes    Comment: wine sometimes  . Drug use: No  . Sexual activity: Never  Lifestyle  . Physical activity:    Days per week: Not on file    Minutes per session: Not on file  . Stress: Not on file  Relationships  . Social connections:    Talks on phone: Not on file    Gets together: Not on file    Attends religious service: Not on file    Active member of club or organization: Not on file    Attends meetings of clubs or organizations: Not on file    Relationship status: Not on file  . Intimate partner violence:    Fear of current or ex partner: Not on file    Emotionally abused: Not on file    Physically abused: Not on file    Forced sexual activity: Not on file  Other Topics Concern  . Not on file  Social History Narrative   Lives w/ husband (married x 3);  3 daughters live in [place], 5 G-children, 1 GG son     Family History  Problem Relation Age of Onset  . Breast cancer Other        aunt   . Coronary artery disease Mother        M at age 66 and F  . Diabetes Mother        M, F, daughter, sister  . Hypertension Mother   . Stroke Father   . Asthma Sister   . Colon cancer Neg Hx     BP (!) 154/72   Pulse 67   Ht 5' (1.524 m)   Wt 141 lb (64 kg)   BMI 27.54 kg/m   Review of Systems: See HPI above.     Objective:  Physical Exam:  Gen: NAD, comfortable in exam room.  Right ankle: Mild swelling around ankle, improved.  No redness, bruising, other deformity. FROM with mild pain on IR and ER. TTP distal fibula, over peroneal tendons.  No other tenderness. Negative ant drawer and talar tilt.   Negative syndesmotic compression. Thompsons test negative. NV intact distally.  MSK u/s:  No ankle effusion.  Soft tissue swelling laterally but improved.  No cortical irregularity of distal fibula.  Mild edema with some  vascularity over cortex though.  Peroneal tendons intact without tears, tendinopathy, tenosynovitis.  MSK u/s: No evidence of ankle effusion.  Soft tissue swelling over the lateral distal lower leg.  No cortical irregularities of the  distal fibula or edema overlying the cortex.  Noted significant soft tissue neovascularity but none overlying the cortex of the fibula.  Peroneal tendons are intact without evidence of tendinopathy or tenosynovitis.  Assessment & Plan:  1. Right ankle pain - Improved compared to last visit.  Finished with keflex for developing cellulitis.  I still don't appreciate any cortical irregularity that would suggest stress fracture but she's very tender on distal fibula suggesting stress reaction.  Discussed treatment is conservative for this - she could tolerate cam walker today and felt better with this so will use this.  Icing, elevation.  F/u in 2 weeks for reevaluation.  Aleve, tylenol if needed.

## 2018-03-11 NOTE — Assessment & Plan Note (Signed)
Improved compared to last visit.  Finished with keflex for developing cellulitis.  I still don't appreciate any cortical irregularity that would suggest stress fracture but she's very tender on distal fibula suggesting stress reaction.  Discussed treatment is conservative for this - she could tolerate cam walker today and felt better with this so will use this.  Icing, elevation.  F/u in 2 weeks for reevaluation.  Aleve, tylenol if needed.

## 2018-03-14 ENCOUNTER — Ambulatory Visit (INDEPENDENT_AMBULATORY_CARE_PROVIDER_SITE_OTHER): Payer: Medicare Other | Admitting: *Deleted

## 2018-03-14 ENCOUNTER — Encounter: Payer: Self-pay | Admitting: *Deleted

## 2018-03-14 ENCOUNTER — Encounter: Payer: Self-pay | Admitting: Internal Medicine

## 2018-03-14 ENCOUNTER — Ambulatory Visit (INDEPENDENT_AMBULATORY_CARE_PROVIDER_SITE_OTHER): Payer: Medicare Other | Admitting: Internal Medicine

## 2018-03-14 VITALS — BP 130/60 | HR 65 | Ht 60.0 in | Wt 147.2 lb

## 2018-03-14 DIAGNOSIS — R61 Generalized hyperhidrosis: Secondary | ICD-10-CM

## 2018-03-14 DIAGNOSIS — M25571 Pain in right ankle and joints of right foot: Secondary | ICD-10-CM | POA: Diagnosis not present

## 2018-03-14 DIAGNOSIS — E118 Type 2 diabetes mellitus with unspecified complications: Secondary | ICD-10-CM | POA: Diagnosis not present

## 2018-03-14 DIAGNOSIS — M1991 Primary osteoarthritis, unspecified site: Secondary | ICD-10-CM

## 2018-03-14 DIAGNOSIS — Z0000 Encounter for general adult medical examination without abnormal findings: Secondary | ICD-10-CM | POA: Diagnosis not present

## 2018-03-14 DIAGNOSIS — Z79899 Other long term (current) drug therapy: Secondary | ICD-10-CM | POA: Diagnosis not present

## 2018-03-14 DIAGNOSIS — F419 Anxiety disorder, unspecified: Secondary | ICD-10-CM | POA: Diagnosis not present

## 2018-03-14 DIAGNOSIS — Z09 Encounter for follow-up examination after completed treatment for conditions other than malignant neoplasm: Secondary | ICD-10-CM

## 2018-03-14 LAB — CBC WITH DIFFERENTIAL/PLATELET
BASOS ABS: 0.1 10*3/uL (ref 0.0–0.1)
Basophils Relative: 0.8 % (ref 0.0–3.0)
Eosinophils Absolute: 0.6 10*3/uL (ref 0.0–0.7)
Eosinophils Relative: 6.8 % — ABNORMAL HIGH (ref 0.0–5.0)
HEMATOCRIT: 38.4 % (ref 36.0–46.0)
Hemoglobin: 12.9 g/dL (ref 12.0–15.0)
LYMPHS PCT: 28.5 % (ref 12.0–46.0)
Lymphs Abs: 2.3 10*3/uL (ref 0.7–4.0)
MCHC: 33.6 g/dL (ref 30.0–36.0)
MCV: 85.9 fl (ref 78.0–100.0)
MONOS PCT: 8.5 % (ref 3.0–12.0)
Monocytes Absolute: 0.7 10*3/uL (ref 0.1–1.0)
Neutro Abs: 4.5 10*3/uL (ref 1.4–7.7)
Neutrophils Relative %: 55.4 % (ref 43.0–77.0)
Platelets: 281 10*3/uL (ref 150.0–400.0)
RBC: 4.48 Mil/uL (ref 3.87–5.11)
RDW: 13.6 % (ref 11.5–15.5)
WBC: 8.2 10*3/uL (ref 4.0–10.5)

## 2018-03-14 LAB — HEMOGLOBIN A1C: Hgb A1c MFr Bld: 6.9 % — ABNORMAL HIGH (ref 4.6–6.5)

## 2018-03-14 NOTE — Progress Notes (Signed)
Subjective:    Patient ID: Monique Bauer, female    DOB: 08-22-1936, 82 y.o.   MRN: [id number]  DOS:  03/14/2018 Type of visit - description : rov Interval history: DM: Ambulatory CBGs between 107 to 136. HTN: BP was elevated one time at another office,  at home BP is never more than 130. High cholesterol: Good med compliance Ankle pain: Notes from sports medicine reviewed DJD?  Reports on and off joint pain and stiffness, thinks is arthritis, takes Aleve sometimes.  Denies any joint to be puffy or warm or swollen Also reports night sweats, she has to drop the temperature of her AC unit to prevent them.  Denies headaches, rash, swelling of lymph nodes or unusual bleedings  BP Readings from Last 3 Encounters:  03/14/18 130/60  03/14/18 130/60  03/10/18 (!) 154/72     Review of Systems  See above No fever chills or weight loss No chest pain, difficulty breathing, nausea or vomiting  Past Medical History:  Diagnosis Date  . Anxiety   . Cervical myelopathy ([place])   . Diabetes ([place]) 12-2012  . Family history of adverse reaction to anesthesia    had problems waking up pt father   . GERD (gastroesophageal reflux disease)   . History of hiatal hernia   . Hyperlipidemia   . Hypertension   . Osteoarthritis   . Osteopenia   . Screening for AAA (abdominal aortic aneurysm) 11/2009   aorta U/S, no AAA    Past Surgical History:  Procedure Laterality Date  . ANTERIOR CERVICAL DECOMP/DISCECTOMY FUSION N/A 05/17/2017   Procedure: C4-5/5-6 ACDF;  Surgeon: Kary Kos, MD;  Location: [facility];  Service: Neurosurgery;  Laterality: N/A;  . CARPAL TUNNEL RELEASE Right 07/2017    Social History   Socioeconomic History  . Marital status: Married    Spouse name: Coralee Pesa  . Number of children: 3  . Years of education: Not on file  . Highest education level: Not on file  Occupational History  . Occupation: retired     Fish farm manager: RETIRED  Social Needs  . Financial resource strain: Not on  file  . Food insecurity:    Worry: Not on file    Inability: Not on file  . Transportation needs:    Medical: Not on file    Non-medical: Not on file  Tobacco Use  . Smoking status: Former Smoker    Last attempt to quit: 10/15/1981    Years since quitting: 36.4  . Smokeless tobacco: Never Used  . Tobacco comment: was a social smoker  Substance and Sexual Activity  . Alcohol use: Yes    Comment: wine sometimes  . Drug use: No  . Sexual activity: Never  Lifestyle  . Physical activity:    Days per week: Not on file    Minutes per session: Not on file  . Stress: Not on file  Relationships  . Social connections:    Talks on phone: Not on file    Gets together: Not on file    Attends religious service: Not on file    Active member of club or organization: Not on file    Attends meetings of clubs or organizations: Not on file    Relationship status: Not on file  . Intimate partner violence:    Fear of current or ex partner: Not on file    Emotionally abused: Not on file    Physically abused: Not on file    Forced sexual activity: Not  on file  Other Topics Concern  . Not on file  Social History Narrative   Lives w/ husband (married x 3);  3 daughters live in [place], 5 G-children, 1 GG son       Allergies as of 03/14/2018      Reactions   Oxycodone Hcl Itching, Rash   Sulfa Antibiotics Rash   Rash all over body    Sulfamethoxazole-trimethoprim Rash   rash all over      Medication List        Accurate as of 03/14/18 11:59 PM. Always use your most recent med list.          ALPRAZolam 0.25 MG tablet Commonly known as:  XANAX Take 1 tablet (0.25 mg total) by mouth at bedtime as needed for sleep.   aspirin 81 MG tablet Take 81 mg by mouth at bedtime.   CALCIUM 500+D PO Take 1 tablet by mouth 2 (two) times daily.   escitalopram 10 MG tablet Commonly known as:  LEXAPRO Take 1 tablet (10 mg total) by mouth daily.   GLUCOSAMINE-CHONDROITIN PO Take 1 tablet by mouth  daily.   glucose blood test strip Commonly known as:  ONETOUCH VERIO Check blood sugars once daily   lisinopril-hydrochlorothiazide 10-12.5 MG tablet Commonly known as:  PRINZIDE,ZESTORETIC TAKE 1 TABLET BY MOUTH  DAILY   meloxicam 7.5 MG tablet Commonly known as:  MOBIC Take 1 tablet (7.5 mg total) by mouth daily as needed. for pain   metFORMIN 500 MG tablet Commonly known as:  GLUCOPHAGE Take 1 tablet (500 mg total) by mouth 2 (two) times daily with a meal.   multivitamin with minerals Tabs tablet Take 1 tablet by mouth daily.   ONETOUCH DELICA LANCETS FINE Misc Check blood sugars once  daily.   pantoprazole 40 MG tablet Commonly known as:  PROTONIX Take 1 tablet (40 mg total) by mouth daily.   simvastatin 80 MG tablet Commonly known as:  ZOCOR Take 1 tablet (80 mg total) by mouth at bedtime.          Objective:   Physical Exam BP 130/60 (BP Location: Left Arm, Patient Position: Sitting, Cuff Size: Normal)   Pulse 65   Ht 5' (1.524 m)   Wt 147 lb 3 oz (66.8 kg)   SpO2 97%   BMI 28.75 kg/m  General:   Well developed, well nourished . NAD.  HEENT:  Normocephalic . Face symmetric, atraumatic Lungs:  CTA B Normal respiratory effort, no intercostal retractions, no accessory muscle use. Heart: RRR,  no murmur.  No pretibial edema bilaterally  Skin: Not pale. Not jaundice Neurologic:  alert & oriented X3.  Speech normal, gait appropriate for age and unassisted Psych--  Cognition and judgment appear intact.  Cooperative with normal attention span and concentration.  Behavior appropriate. No anxious or depressed appearing.      Assessment & Plan:   Assessment Diabetes 2014 HTN Hyperlipidemia Anxiety DJD GERD Osteopenia; dexa 07-2016 wnl  Ao [place] (-)  AAA 2010 Cervical myelopathy, s/p surgery 05-2017 CTS s/p R surgery 2018  PLAN:  DM: On metformin, check a A1c HTN: Well-controlled on Zestoretic, last BMP satisfactory DJD: Generalized aches and  pains without red flag sxs, likely DJD.  Recommend judicious use of Tylenol and meloxicam.  Rec avoid prolonged/daily use of meloxicam and do not mix it with OTCs such as ibuprofen, Aleve, etc.  She seems to understand.  GI precautions also discussed. Anxiety: On Lexapro and Xanax, UDS and contract today Night  sweats: No red flag symptoms, will check a CBC and peripheral blood smear for completeness Ankle pain: Saw sports medicine twice, developing cellulitis?  Was treated with Keflex, cont w/ pain, ?stress reaction, was Rx a boot and has a f/u soon  Follow-up 6 months

## 2018-03-14 NOTE — Progress Notes (Signed)
Pre visit review using our clinic review tool, if applicable. No additional management support is needed unless otherwise documented below in the visit note. 

## 2018-03-14 NOTE — Patient Instructions (Signed)
Continue to eat heart healthy diet (full of fruits, vegetables, whole grains, lean protein, water--limit salt, fat, and sugar intake) and increase physical activity as tolerated.  Continue doing brain stimulating activities (puzzles, reading, adult coloring books, staying active) to keep memory sharp.    Monique Bauer , Thank you for taking time to come for your Medicare Wellness Visit. I appreciate your ongoing commitment to your health goals. Please review the following plan we discussed and let me know if I can assist you in the future.   These are the goals we discussed: Goals    . Weight (lb) < 130 lb (59 kg)     Pt in weight loss support group weekly.       This is a list of the screening recommended for you and due dates:  Health Maintenance  Topic Date Due  . Eye exam for diabetics  02/25/2017  . Complete foot exam   03/31/2017  . Hemoglobin A1C  05/09/2018  . Flu Shot  07/14/2018  . Mammogram  07/28/2018  . Tetanus Vaccine  11/12/2019  . DEXA scan (bone density measurement)  Completed  . Pneumonia vaccines  Completed

## 2018-03-14 NOTE — Patient Instructions (Addendum)
GO TO THE LAB : Get the blood work    GO TO THE FRONT DESK Schedule your next appointment for a checkup in 6 months  For arthritis: Okay to take meloxicam daily temporarily, try to alternate with Tylenol to minimize anti-inflammatories use . Tylenol  500 mg OTC 2 tabs a day every 8 hours as needed for pain  Check the  blood pressure 2 or 3 times a month   Be sure your blood pressure is between 110/65 and  135/85. If it is consistently higher or lower, let me know

## 2018-03-15 LAB — PAIN MGMT, PROFILE 8 W/CONF, U
6 ACETYLMORPHINE: NEGATIVE ng/mL (ref <10)
AMPHETAMINES: NEGATIVE ng/mL (ref <500)
Alcohol Metabolites: NEGATIVE ng/mL (ref <500)
BUPRENORPHINE, URINE: NEGATIVE ng/mL (ref <5)
Benzodiazepines: NEGATIVE ng/mL (ref <100)
CREATININE: 83.3 mg/dL
Cocaine Metabolite: NEGATIVE ng/mL (ref <150)
MDMA: NEGATIVE ng/mL (ref <500)
Marijuana Metabolite: NEGATIVE ng/mL (ref <20)
OPIATES: NEGATIVE ng/mL (ref <100)
Oxidant: NEGATIVE ug/mL (ref <200)
Oxycodone: NEGATIVE ng/mL (ref <100)
PH: 6.47 (ref 4.5–9.0)

## 2018-03-15 LAB — PATHOLOGIST SMEAR REVIEW

## 2018-03-15 NOTE — Assessment & Plan Note (Signed)
DM: On metformin, check a A1c HTN: Well-controlled on Zestoretic, last BMP satisfactory DJD: Generalized aches and pains without red flag sxs, likely DJD.  Recommend judicious use of Tylenol and meloxicam.  Rec avoid prolonged/daily use of meloxicam and do not mix it with OTCs such as ibuprofen, Aleve, etc.  She seems to understand.  GI precautions also discussed. Anxiety: On Lexapro and Xanax, UDS and contract today Night sweats: No red flag symptoms, will check a CBC and peripheral blood smear for completeness Ankle pain: Saw sports medicine twice, developing cellulitis?  Was treated with Keflex, cont w/ pain, ?stress reaction, was Rx a boot and has a f/u soon  Follow-up 6 months

## 2018-03-24 ENCOUNTER — Ambulatory Visit (INDEPENDENT_AMBULATORY_CARE_PROVIDER_SITE_OTHER): Payer: Medicare Other | Admitting: Family Medicine

## 2018-03-24 DIAGNOSIS — M25571 Pain in right ankle and joints of right foot: Secondary | ICD-10-CM | POA: Diagnosis not present

## 2018-03-24 NOTE — Patient Instructions (Signed)
You have a stress reaction of your distal fibula. Wear the boot for 3 more weeks. No flat shoes, barefoot walking. Icing 15 minutes at a time 3-4 times a day as needed. Tylenol if needed. Elevation as needed for swelling. Follow up with me in 3 weeks.

## 2018-03-26 ENCOUNTER — Encounter: Payer: Self-pay | Admitting: Family Medicine

## 2018-03-26 NOTE — Progress Notes (Signed)
PCP: Colon Branch, MD  Subjective:   HPI: Patient is a 82 y.o. female here for right ankle pain.  3/21: Patient denies known injury or trauma. She states that she started to get lateral right ankle pain last Thursday. She has been doing a lot of walking on a treadmill but does not recall this directly leading to her pain. She states the pain is gotten up to a 10 out of 10 with walking. Has since developed redness and swelling laterally. She does have a history of cellulitis previously. She has been icing twice a day and taking Advil twice a day 400 mg. No history of gout. No numbness.  3/28: Patient reports she's doing much better. Pain level now 4/10 still lateral right ankle. Swelling improved as well as redness. Does get throbbing here still. Worse with ambulation, better with elevation. Finished keflex. Taking aleve too which helps. No new skin changes, numbness.  4/11: Patient reports she's doing well. Has some soreness lateral ankle especially to the touch. Much better with cam walker. Pain currently 0/10. Has been icing daily. No skin changes, numbness.  Past Medical History:  Diagnosis Date  . Anxiety   . Cervical myelopathy ([place])   . Diabetes ([place]) 12-2012  . Family history of adverse reaction to anesthesia    had problems waking up pt father   . GERD (gastroesophageal reflux disease)   . History of hiatal hernia   . Hyperlipidemia   . Hypertension   . Osteoarthritis   . Osteopenia   . Screening for AAA (abdominal aortic aneurysm) 11/2009   aorta U/S, no AAA    Current Outpatient Medications on File Prior to Visit  Medication Sig Dispense Refill  . ALPRAZolam (XANAX) 0.25 MG tablet Take 1 tablet (0.25 mg total) by mouth at bedtime as needed for sleep. (Patient not taking: Reported on 03/14/2018) 30 tablet 1  . aspirin 81 MG tablet Take 81 mg by mouth at bedtime.     . Calcium Carb-Cholecalciferol (CALCIUM 500+D PO) Take 1 tablet by mouth 2 (two) times  daily.    Marland Kitchen escitalopram (LEXAPRO) 10 MG tablet Take 1 tablet (10 mg total) by mouth daily. 90 tablet 1  . GLUCOSAMINE-CHONDROITIN PO Take 1 tablet by mouth daily.    Marland Kitchen glucose blood (ONETOUCH VERIO) test strip Check blood sugars once daily 100 each 12  . lisinopril-hydrochlorothiazide (PRINZIDE,ZESTORETIC) 10-12.5 MG tablet TAKE 1 TABLET BY MOUTH  DAILY 90 tablet 1  . meloxicam (MOBIC) 7.5 MG tablet Take 1 tablet (7.5 mg total) by mouth daily as needed. for pain 90 tablet 1  . metFORMIN (GLUCOPHAGE) 500 MG tablet Take 1 tablet (500 mg total) by mouth 2 (two) times daily with a meal. 180 tablet 1  . Multiple Vitamin (MULTIVITAMIN WITH MINERALS) TABS tablet Take 1 tablet by mouth daily.    Glory Rosebush DELICA LANCETS FINE MISC Check blood sugars once  daily. 100 each 12  . pantoprazole (PROTONIX) 40 MG tablet Take 1 tablet (40 mg total) by mouth daily. 90 tablet 1  . simvastatin (ZOCOR) 80 MG tablet Take 1 tablet (80 mg total) by mouth at bedtime. 90 tablet 1   No current facility-administered medications on file prior to visit.     Past Surgical History:  Procedure Laterality Date  . ANTERIOR CERVICAL DECOMP/DISCECTOMY FUSION N/A 05/17/2017   Procedure: C4-5/5-6 ACDF;  Surgeon: Kary Kos, MD;  Location: [facility];  Service: Neurosurgery;  Laterality: N/A;  . CARPAL TUNNEL RELEASE Right 07/2017  Allergies  Allergen Reactions  . Oxycodone Hcl Itching and Rash  . Sulfa Antibiotics Rash    Rash all over body   . Sulfamethoxazole-Trimethoprim Rash    rash all over    Social History   Socioeconomic History  . Marital status: Married    Spouse name: Coralee Pesa  . Number of children: 3  . Years of education: Not on file  . Highest education level: Not on file  Occupational History  . Occupation: retired     Fish farm manager: RETIRED  Social Needs  . Financial resource strain: Not on file  . Food insecurity:    Worry: Not on file    Inability: Not on file  . Transportation needs:    Medical: Not  on file    Non-medical: Not on file  Tobacco Use  . Smoking status: Former Smoker    Last attempt to quit: 10/15/1981    Years since quitting: 36.4  . Smokeless tobacco: Never Used  . Tobacco comment: was a social smoker  Substance and Sexual Activity  . Alcohol use: Yes    Comment: wine sometimes  . Drug use: No  . Sexual activity: Never  Lifestyle  . Physical activity:    Days per week: Not on file    Minutes per session: Not on file  . Stress: Not on file  Relationships  . Social connections:    Talks on phone: Not on file    Gets together: Not on file    Attends religious service: Not on file    Active member of club or organization: Not on file    Attends meetings of clubs or organizations: Not on file    Relationship status: Not on file  . Intimate partner violence:    Fear of current or ex partner: Not on file    Emotionally abused: Not on file    Physically abused: Not on file    Forced sexual activity: Not on file  Other Topics Concern  . Not on file  Social History Narrative   Lives w/ husband (married x 3);  3 daughters live in [place], 5 G-children, 1 GG son     Family History  Problem Relation Age of Onset  . Breast cancer Other        aunt   . Coronary artery disease Mother        M at age 17 and F  . Diabetes Mother        M, F, daughter, sister  . Hypertension Mother   . Stroke Father   . Asthma Sister   . Colon cancer Neg Hx     BP (!) 158/76   Pulse 65   Ht 5' (1.524 m)   Wt 143 lb (64.9 kg)   BMI 27.93 kg/m   Review of Systems: See HPI above.     Objective:  Physical Exam:  Gen: NAD, comfortable in exam room  Right ankle: No gross deformity, swelling, ecchymoses FROM with 5/5 strength. TTP distal fibula mildly.  No other tenderness. Negative ant drawer and talar tilt.   Negative syndesmotic compression. Thompsons test negative. NV intact distally.  Assessment & Plan:  1. Right ankle pain - Cellulitis resolved.  Stress reaction  of distal fibula improving.  Wear cam walker for 3 more weeks.  No flat shoes, barefoot walking.  Icing, tylenol if needed.  Elevation if needed for swelling.  Fu in 3 weeks.

## 2018-03-26 NOTE — Assessment & Plan Note (Signed)
Cellulitis resolved.  Stress reaction of distal fibula improving.  Wear cam walker for 3 more weeks.  No flat shoes, barefoot walking.  Icing, tylenol if needed.  Elevation if needed for swelling.  Fu in 3 weeks.

## 2018-04-14 ENCOUNTER — Ambulatory Visit (INDEPENDENT_AMBULATORY_CARE_PROVIDER_SITE_OTHER): Payer: Medicare Other | Admitting: Family Medicine

## 2018-04-14 ENCOUNTER — Encounter: Payer: Self-pay | Admitting: Family Medicine

## 2018-04-14 DIAGNOSIS — M25571 Pain in right ankle and joints of right foot: Secondary | ICD-10-CM | POA: Diagnosis not present

## 2018-04-14 NOTE — Patient Instructions (Signed)
Continue with your calcium and vitamin D. Switch to a supportive shoe for the next 2-3 weeks (tennis shoe, running shoe). Keep boot with you for a few days just in case it gets sore. Start theraband strengthening - 1 set of 10 once a day, build up to 3 sets of 10 once a day. Call me if this worsens - some soreness to 3/10 level is expected but it shouldn't get much worse - if it does we will go ahead with an MRI because this would be unusual.

## 2018-04-15 ENCOUNTER — Encounter: Payer: Self-pay | Admitting: Family Medicine

## 2018-04-15 NOTE — Progress Notes (Signed)
PCP: Colon Branch, MD  Subjective:   HPI: Patient is a 82 y.o. female here for right ankle pain.  3/21: Patient denies known injury or trauma. She states that she started to get lateral right ankle pain last Thursday. She has been doing a lot of walking on a treadmill but does not recall this directly leading to her pain. She states the pain is gotten up to a 10 out of 10 with walking. Has since developed redness and swelling laterally. She does have a history of cellulitis previously. She has been icing twice a day and taking Advil twice a day 400 mg. No history of gout. No numbness.  3/28: Patient reports she's doing much better. Pain level now 4/10 still lateral right ankle. Swelling improved as well as redness. Does get throbbing here still. Worse with ambulation, better with elevation. Finished keflex. Taking aleve too which helps. No new skin changes, numbness.  4/11: Patient reports she's doing well. Has some soreness lateral ankle especially to the touch. Much better with cam walker. Pain currently 0/10. Has been icing daily. No skin changes, numbness.  5/2: Patient reports she feels better. Pain level 0/10. Only a little soreness lateral ankle. Wearing cam walker. Taking calcium and vitamin D. No skin changes.  Past Medical History:  Diagnosis Date  . Anxiety   . Cervical myelopathy ([place])   . Diabetes ([place]) 12-2012  . Family history of adverse reaction to anesthesia    had problems waking up pt father   . GERD (gastroesophageal reflux disease)   . History of hiatal hernia   . Hyperlipidemia   . Hypertension   . Osteoarthritis   . Osteopenia   . Screening for AAA (abdominal aortic aneurysm) 11/2009   aorta U/S, no AAA    Current Outpatient Medications on File Prior to Visit  Medication Sig Dispense Refill  . ALPRAZolam (XANAX) 0.25 MG tablet Take 1 tablet (0.25 mg total) by mouth at bedtime as needed for sleep. (Patient not taking: Reported on  03/14/2018) 30 tablet 1  . aspirin 81 MG tablet Take 81 mg by mouth at bedtime.     . Calcium Carb-Cholecalciferol (CALCIUM 500+D PO) Take 1 tablet by mouth 2 (two) times daily.    Marland Kitchen escitalopram (LEXAPRO) 10 MG tablet Take 1 tablet (10 mg total) by mouth daily. 90 tablet 1  . GLUCOSAMINE-CHONDROITIN PO Take 1 tablet by mouth daily.    Marland Kitchen glucose blood (ONETOUCH VERIO) test strip Check blood sugars once daily 100 each 12  . lisinopril-hydrochlorothiazide (PRINZIDE,ZESTORETIC) 10-12.5 MG tablet TAKE 1 TABLET BY MOUTH  DAILY 90 tablet 1  . meloxicam (MOBIC) 7.5 MG tablet Take 1 tablet (7.5 mg total) by mouth daily as needed. for pain 90 tablet 1  . metFORMIN (GLUCOPHAGE) 500 MG tablet Take 1 tablet (500 mg total) by mouth 2 (two) times daily with a meal. 180 tablet 1  . Multiple Vitamin (MULTIVITAMIN WITH MINERALS) TABS tablet Take 1 tablet by mouth daily.    Glory Rosebush DELICA LANCETS FINE MISC Check blood sugars once  daily. 100 each 12  . pantoprazole (PROTONIX) 40 MG tablet Take 1 tablet (40 mg total) by mouth daily. 90 tablet 1  . simvastatin (ZOCOR) 80 MG tablet Take 1 tablet (80 mg total) by mouth at bedtime. 90 tablet 1   No current facility-administered medications on file prior to visit.     Past Surgical History:  Procedure Laterality Date  . ANTERIOR CERVICAL DECOMP/DISCECTOMY FUSION N/A 05/17/2017  Procedure: C4-5/5-6 ACDF;  Surgeon: Kary Kos, MD;  Location: [facility];  Service: Neurosurgery;  Laterality: N/A;  . CARPAL TUNNEL RELEASE Right 07/2017    Allergies  Allergen Reactions  . Oxycodone Hcl Itching and Rash  . Sulfa Antibiotics Rash    Rash all over body   . Sulfamethoxazole-Trimethoprim Rash    rash all over    Social History   Socioeconomic History  . Marital status: Married    Spouse name: Coralee Pesa  . Number of children: 3  . Years of education: Not on file  . Highest education level: Not on file  Occupational History  . Occupation: retired     Fish farm manager: RETIRED   Social Needs  . Financial resource strain: Not on file  . Food insecurity:    Worry: Not on file    Inability: Not on file  . Transportation needs:    Medical: Not on file    Non-medical: Not on file  Tobacco Use  . Smoking status: Former Smoker    Last attempt to quit: 10/15/1981    Years since quitting: 36.5  . Smokeless tobacco: Never Used  . Tobacco comment: was a social smoker  Substance and Sexual Activity  . Alcohol use: Yes    Comment: wine sometimes  . Drug use: No  . Sexual activity: Never  Lifestyle  . Physical activity:    Days per week: Not on file    Minutes per session: Not on file  . Stress: Not on file  Relationships  . Social connections:    Talks on phone: Not on file    Gets together: Not on file    Attends religious service: Not on file    Active member of club or organization: Not on file    Attends meetings of clubs or organizations: Not on file    Relationship status: Not on file  . Intimate partner violence:    Fear of current or ex partner: Not on file    Emotionally abused: Not on file    Physically abused: Not on file    Forced sexual activity: Not on file  Other Topics Concern  . Not on file  Social History Narrative   Lives w/ husband (married x 3);  3 daughters live in [place], 5 G-children, 1 GG son     Family History  Problem Relation Age of Onset  . Breast cancer Other        aunt   . Coronary artery disease Mother        M at age 52 and F  . Diabetes Mother        M, F, daughter, sister  . Hypertension Mother   . Stroke Father   . Asthma Sister   . Colon cancer Neg Hx     BP 126/66   Pulse 76   Ht 5' (1.524 m)   Wt 143 lb (64.9 kg)   BMI 27.93 kg/m   Review of Systems: See HPI above.     Objective:  Physical Exam:  Gen: NAD, comfortable in exam room  Right ankle: No gross deformity, swelling, ecchymoses FROM with 5/5 strength. TTP minimally distal fibula.  No other tenderness. Negative ant drawer and talar  tilt.   Negative syndesmotic compression. Thompsons test negative. NV intact distally.  MSK u/s right ankle:  No cortical irregularity, edema overlying cortex of distal fibula.  Assessment & Plan:  1. Right ankle pain - Cellulitis resolved.  Stress reaction improved.  Discontinue  scam walker and switch to supportive shoe for next 2-3 weeks.  Start theraband strengthening.  F/u prn but discussed parameters to call me.  Calcium, vitamin D.

## 2018-04-15 NOTE — Assessment & Plan Note (Signed)
Cellulitis resolved.  Stress reaction improved.  Discontinue scam walker and switch to supportive shoe for next 2-3 weeks.  Start theraband strengthening.  F/u prn but discussed parameters to call me.  Calcium, vitamin D.

## 2018-06-17 ENCOUNTER — Other Ambulatory Visit: Payer: Self-pay | Admitting: Internal Medicine

## 2018-08-03 ENCOUNTER — Encounter: Payer: Self-pay | Admitting: Internal Medicine

## 2018-08-03 LAB — HM MAMMOGRAPHY

## 2018-08-11 ENCOUNTER — Other Ambulatory Visit: Payer: Self-pay | Admitting: Internal Medicine

## 2018-09-13 ENCOUNTER — Ambulatory Visit: Payer: Medicare Other | Admitting: Internal Medicine

## 2018-09-15 ENCOUNTER — Ambulatory Visit (INDEPENDENT_AMBULATORY_CARE_PROVIDER_SITE_OTHER): Payer: Medicare Other | Admitting: Internal Medicine

## 2018-09-15 ENCOUNTER — Encounter: Payer: Self-pay | Admitting: Internal Medicine

## 2018-09-15 VITALS — BP 132/80 | HR 63 | Temp 98.0°F | Resp 16 | Ht 60.0 in | Wt 143.4 lb

## 2018-09-15 DIAGNOSIS — E119 Type 2 diabetes mellitus without complications: Secondary | ICD-10-CM

## 2018-09-15 DIAGNOSIS — M159 Polyosteoarthritis, unspecified: Secondary | ICD-10-CM

## 2018-09-15 DIAGNOSIS — I10 Essential (primary) hypertension: Secondary | ICD-10-CM

## 2018-09-15 DIAGNOSIS — M150 Primary generalized (osteo)arthritis: Secondary | ICD-10-CM | POA: Diagnosis not present

## 2018-09-15 DIAGNOSIS — R52 Pain, unspecified: Secondary | ICD-10-CM

## 2018-09-15 LAB — COMPREHENSIVE METABOLIC PANEL
ALT: 19 U/L (ref 0–35)
AST: 21 U/L (ref 0–37)
Albumin: 4.2 g/dL (ref 3.5–5.2)
Alkaline Phosphatase: 67 U/L (ref 39–117)
BUN: 16 mg/dL (ref 6–23)
CALCIUM: 9.7 mg/dL (ref 8.4–10.5)
CHLORIDE: 103 meq/L (ref 96–112)
CO2: 30 meq/L (ref 19–32)
CREATININE: 1.03 mg/dL (ref 0.40–1.20)
GFR: 54.81 mL/min — ABNORMAL LOW (ref >60.00)
Glucose, Bld: 129 mg/dL — ABNORMAL HIGH (ref 70–99)
POTASSIUM: 3.8 meq/L (ref 3.5–5.1)
SODIUM: 139 meq/L (ref 135–145)
Total Bilirubin: 0.3 mg/dL (ref 0.2–1.2)
Total Protein: 7.4 g/dL (ref 6.0–8.3)

## 2018-09-15 LAB — TSH: TSH: 1.92 u[IU]/mL (ref 0.35–4.50)

## 2018-09-15 LAB — HEMOGLOBIN A1C: Hgb A1c MFr Bld: 7 % — ABNORMAL HIGH (ref 4.6–6.5)

## 2018-09-15 LAB — SEDIMENTATION RATE: Sed Rate: 58 mm/hr — ABNORMAL HIGH (ref 0–30)

## 2018-09-15 NOTE — Assessment & Plan Note (Signed)
DM: Continue metformin, check a A1c, (-) feet exam HTN: Continue Zestoretic.  Checking a CMP High cholesterol: On simvastatin, check FLP. DJD: Generalized aches and pains likely due to DJD, no synovitis on exam, negative review of systems. When she was taking meloxicam she felt somewhat better, recommend to go back on it, GI precautions discussed again. Encouraged to stay active, yoga?. Given widespread pain will check a sed rate, vitamin D and TSH. Also hold simvastatin for 3 weeks, see if not make any difference on aches and pains.  Statin might be a contributing factor to her pain. RTC 3 months CPX

## 2018-09-15 NOTE — Progress Notes (Signed)
Subjective:    Patient ID: Monique Bauer, female    DOB: July 27, 1936, 82 y.o.   MRN: [id number]  DOS:  09/15/2018 Type of visit - description : rov Interval history: DJD: Continue with generalized aches and pains in most joints, particularly at the right neck.  Not taking meloxicam and in retrospect it was helping. High cholesterol: On simvastatin HTN: Good med compliance, ambulatory BPs normal.  Review of Systems  Denies fever, chills. No headache or weight loss No visual disturbances She has occasional knee paresthesias located at the base of the thumb.  Denies finger or lower extremity paresthesias.  Past Medical History:  Diagnosis Date  . Anxiety   . Cervical myelopathy ([place])   . Diabetes ([place]) 12-2012  . Family history of adverse reaction to anesthesia    had problems waking up pt father   . GERD (gastroesophageal reflux disease)   . History of hiatal hernia   . Hyperlipidemia   . Hypertension   . Osteoarthritis   . Osteopenia   . Screening for AAA (abdominal aortic aneurysm) 11/2009   aorta U/S, no AAA    Past Surgical History:  Procedure Laterality Date  . ANTERIOR CERVICAL DECOMP/DISCECTOMY FUSION N/A 05/17/2017   Procedure: C4-5/5-6 ACDF;  Surgeon: Kary Kos, MD;  Location: [facility];  Service: Neurosurgery;  Laterality: N/A;  . CARPAL TUNNEL RELEASE Right 07/2017    Social History   Socioeconomic History  . Marital status: Married    Spouse name: Coralee Pesa  . Number of children: 3  . Years of education: Not on file  . Highest education level: Not on file  Occupational History  . Occupation: retired     Fish farm manager: RETIRED  Social Needs  . Financial resource strain: Not on file  . Food insecurity:    Worry: Not on file    Inability: Not on file  . Transportation needs:    Medical: Not on file    Non-medical: Not on file  Tobacco Use  . Smoking status: Former Smoker    Last attempt to quit: 10/15/1981    Years since quitting: 36.9  . Smokeless tobacco:  Never Used  . Tobacco comment: was a social smoker  Substance and Sexual Activity  . Alcohol use: Yes    Comment: wine sometimes  . Drug use: No  . Sexual activity: Not Currently  Lifestyle  . Physical activity:    Days per week: Not on file    Minutes per session: Not on file  . Stress: Not on file  Relationships  . Social connections:    Talks on phone: Not on file    Gets together: Not on file    Attends religious service: Not on file    Active member of club or organization: Not on file    Attends meetings of clubs or organizations: Not on file    Relationship status: Not on file  . Intimate partner violence:    Fear of current or ex partner: Not on file    Emotionally abused: Not on file    Physically abused: Not on file    Forced sexual activity: Not on file  Other Topics Concern  . Not on file  Social History Narrative   Lives w/ husband (married x 3);  3 daughters live in [place], 5 G-children, 1 GG son       Allergies as of 09/15/2018      Reactions   Oxycodone Hcl Itching, Rash   Sulfa Antibiotics Rash  Rash all over body    Sulfamethoxazole-trimethoprim Rash   rash all over      Medication List        Accurate as of 09/15/18  3:18 PM. Always use your most recent med list.          ALPRAZolam 0.25 MG tablet Commonly known as:  XANAX Take 1 tablet (0.25 mg total) by mouth at bedtime as needed for sleep.   aspirin 81 MG tablet Take 81 mg by mouth at bedtime.   CALCIUM 500+D PO Take 1 tablet by mouth 2 (two) times daily.   escitalopram 10 MG tablet Commonly known as:  LEXAPRO TAKE 1 TABLET BY MOUTH  DAILY   GLUCOSAMINE-CHONDROITIN PO Take 1 tablet by mouth daily.   lisinopril-hydrochlorothiazide 10-12.5 MG tablet Commonly known as:  PRINZIDE,ZESTORETIC TAKE 1 TABLET BY MOUTH  DAILY   meloxicam 7.5 MG tablet Commonly known as:  MOBIC TAKE 1 TABLET BY MOUTH  DAILY AS NEEDED FOR PAIN   metFORMIN 500 MG tablet Commonly known as:   GLUCOPHAGE TAKE 1 TABLET BY MOUTH TWO  TIMES DAILY WITH A MEAL   multivitamin with minerals Tabs tablet Take 1 tablet by mouth daily.   ONETOUCH DELICA LANCETS FINE Misc CHECK BLOOD SUGARS ONCE  DAILY.   ONETOUCH VERIO test strip Generic drug:  glucose blood CHECK BLOOD SUGARS ONCE  DAILY   pantoprazole 40 MG tablet Commonly known as:  PROTONIX Take 1 tablet (40 mg total) by mouth daily.   simvastatin 80 MG tablet Commonly known as:  ZOCOR Take 1 tablet (80 mg total) by mouth at bedtime.          Objective:   Physical Exam BP 132/80 (BP Location: Left Arm, Patient Position: Sitting, Cuff Size: Small)   Pulse 63   Temp 98 F (36.7 C) (Oral)   Resp 16   Ht 5' (1.524 m)   Wt 143 lb 6 oz (65 kg)   SpO2 98%   BMI 28.00 kg/m  General:   Well developed, NAD, see BMI.  HEENT:  Normocephalic . Face symmetric, atraumatic Lungs:  CTA B Normal respiratory effort, no intercostal retractions, no accessory muscle use. Heart: RRR,  no murmur.  No pretibial edema bilaterally  Skin: Not pale. Not jaundice MSK: Hands and wrists with no synovitis but bony changes consistent with DJD. DIABETIC FEET EXAM: No lower extremity edema Normal pedal pulses bilaterally Skin normal, nails normal, no calluses Pinprick examination of the feet normal. Neurologic:  alert & oriented X3.  Speech normal, gait appropriate for age and unassisted Psych--  Cognition and judgment appear intact.  Cooperative with normal attention span and concentration.  Behavior appropriate. No anxious or depressed appearing.      Assessment & Plan:   Assessment Diabetes 2014 HTN Hyperlipidemia Anxiety DJD GERD Osteopenia; dexa 07-2016 wnl  Ao [place] (-)  AAA 2010 Cervical myelopathy, s/p surgery 05-2017 CTS s/p R surgery 2018  PLAN:  DM: Continue metformin, check a A1c, (-) feet exam HTN: Continue Zestoretic.  Checking a CMP High cholesterol: On simvastatin, check FLP. DJD: Generalized aches and  pains likely due to DJD, no synovitis on exam, negative review of systems. When she was taking meloxicam she felt somewhat better, recommend to go back on it, GI precautions discussed again. Encouraged to stay active, yoga?. Given widespread pain will check a sed rate, vitamin D and TSH. Also hold simvastatin for 3 weeks, see if not make any difference on aches and pains.  Statin  might be a contributing factor to her pain. RTC 3 months CPX

## 2018-09-15 NOTE — Patient Instructions (Signed)
GO TO THE LAB : Get the blood work     GO TO THE FRONT DESK Schedule your next appointment for a physical exam in 3 months.  Hold simvastatin for 3 weeks, see if that has any major impact on your aches and pains.

## 2018-09-15 NOTE — Progress Notes (Signed)
Pre visit review using our clinic review tool, if applicable. No additional management support is needed unless otherwise documented below in the visit note. 

## 2018-09-19 LAB — VITAMIN D 1,25 DIHYDROXY
Vitamin D 1, 25 (OH)2 Total: 23 pg/mL (ref 18–72)
Vitamin D2 1, 25 (OH)2: <8 pg/mL
Vitamin D3 1, 25 (OH)2: 23 pg/mL

## 2018-09-21 MED ORDER — METFORMIN HCL 1000 MG PO TABS: 1000.0000 mg | ORAL_TABLET | Freq: Two times a day (BID) | ORAL | 1 refills | Status: DC

## 2018-09-21 NOTE — Addendum Note (Signed)
Addended byDamita Dunnings D on: 09/21/2018 05:02 PM   Modules accepted: Orders

## 2018-10-03 LAB — HM DIABETES EYE EXAM

## 2018-10-07 ENCOUNTER — Encounter: Payer: Self-pay | Admitting: Internal Medicine

## 2018-10-07 ENCOUNTER — Other Ambulatory Visit: Payer: Self-pay | Admitting: Internal Medicine

## 2018-10-07 MED ORDER — ATORVASTATIN CALCIUM 20 MG PO TABS: 20.0000 mg | ORAL_TABLET | Freq: Every day | ORAL | 0 refills | Status: DC

## 2018-10-18 ENCOUNTER — Encounter: Payer: Self-pay | Admitting: Internal Medicine

## 2018-10-19 ENCOUNTER — Encounter: Payer: Self-pay | Admitting: Internal Medicine

## 2018-10-31 ENCOUNTER — Other Ambulatory Visit: Payer: Self-pay | Admitting: Family Medicine

## 2018-11-18 IMAGING — US US CAROTID DUPLEX BILAT
1 series · 13 of 24 positions shown · non-contrast
Comparison: None available

CLINICAL DATA: Carotid atherosclerosis, hypertension,
hyperlipidemia, diabetes

EXAM:
BILATERAL CAROTID DUPLEX ULTRASOUND
TECHNIQUE: Gray scale imaging, color Doppler and duplex ultrasound were
performed of bilateral carotid and vertebral arteries in the neck.

[Series 1: us carotid duplex bilat · 0.07mm/px · 13 of 81 slices shown]
[im 1/81]
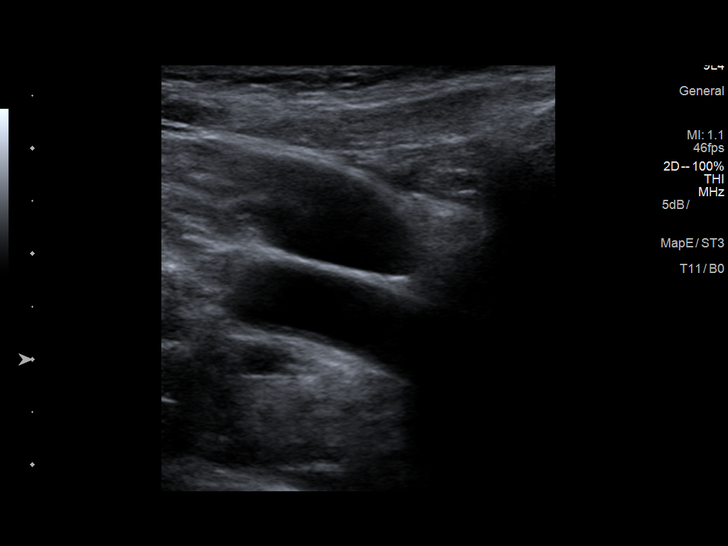
[im 7/81]
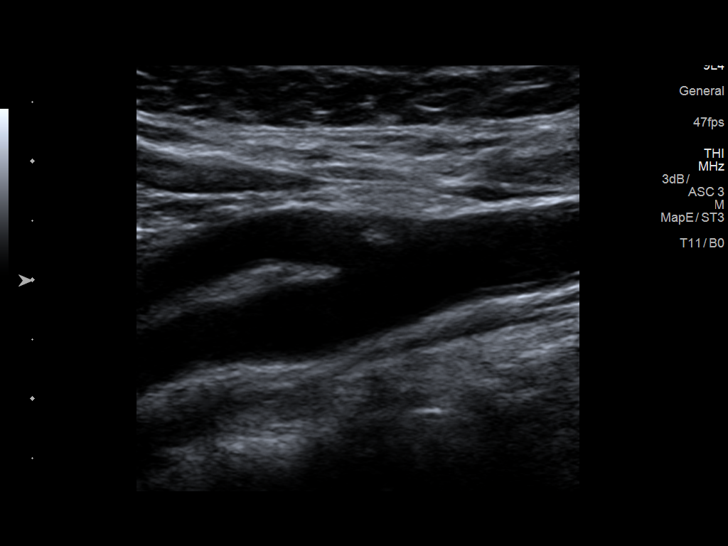
[im 14/81]
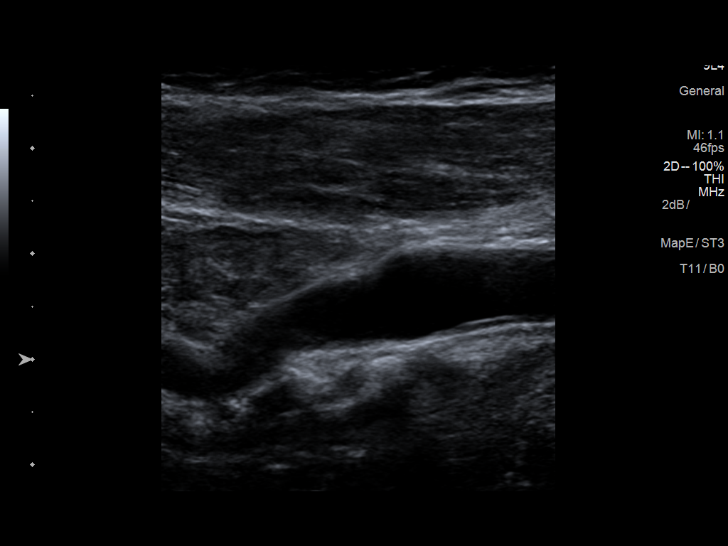
[im 21/81]
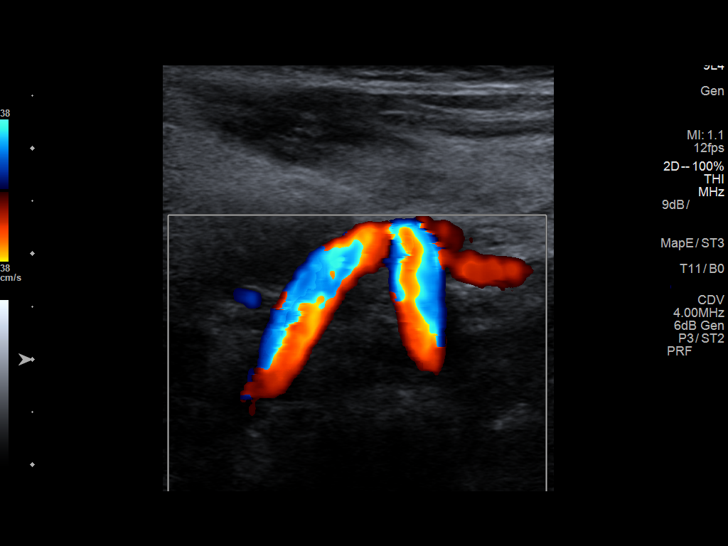
[im 28/81]
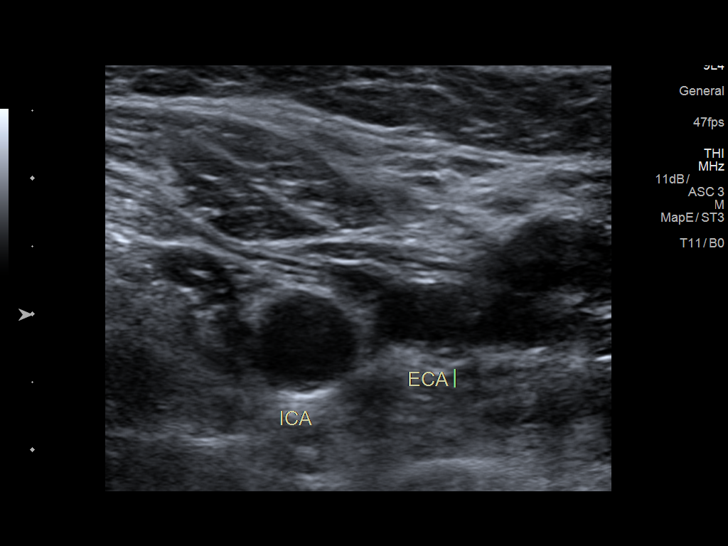
[im 35/81]
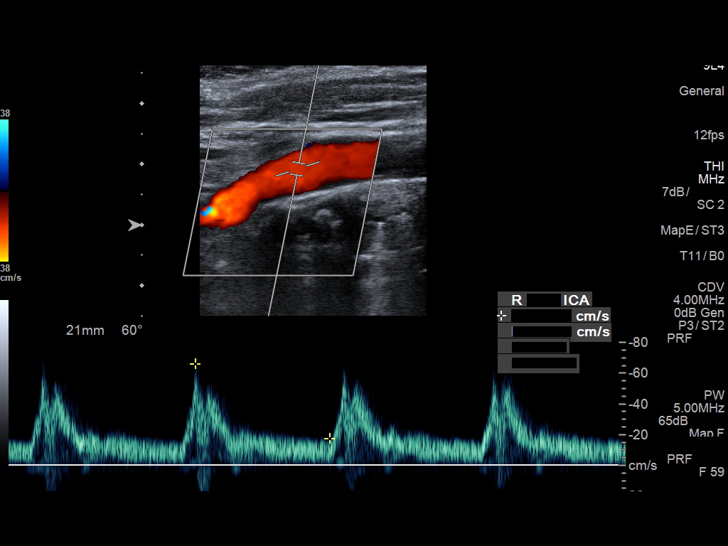
[im 42/81]
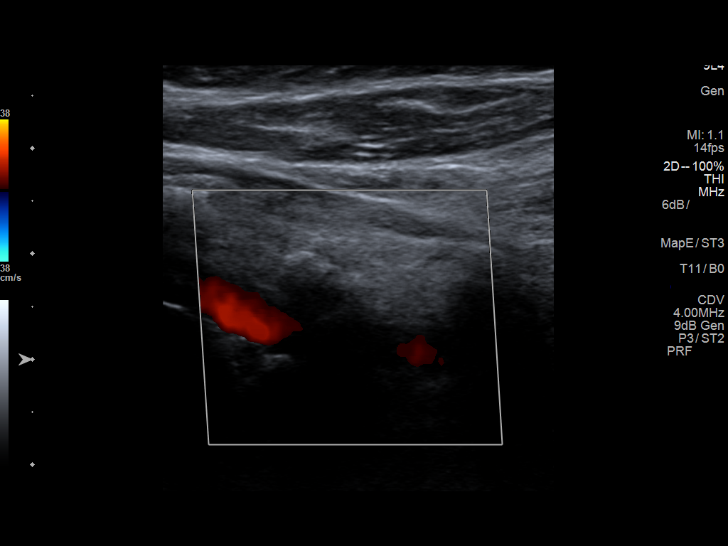
[im 46/81]
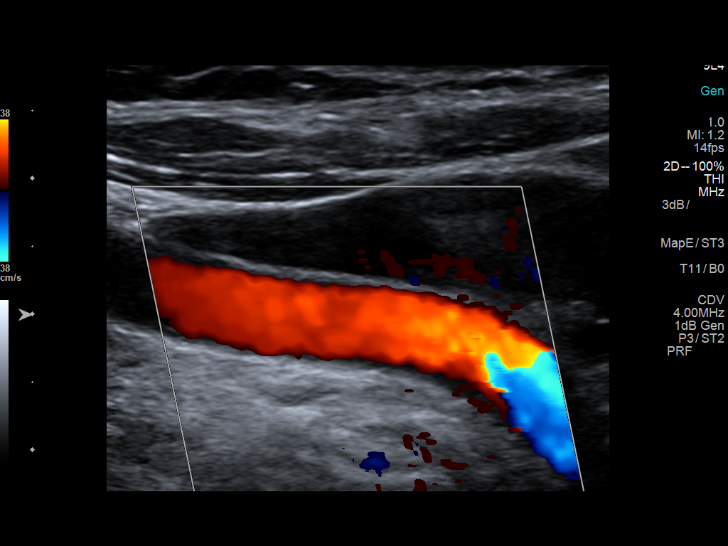
[im 53/81]
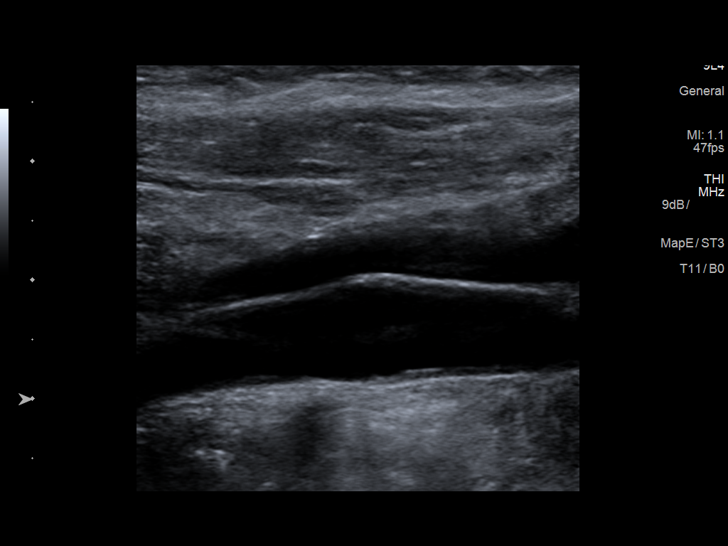
[im 60/81]
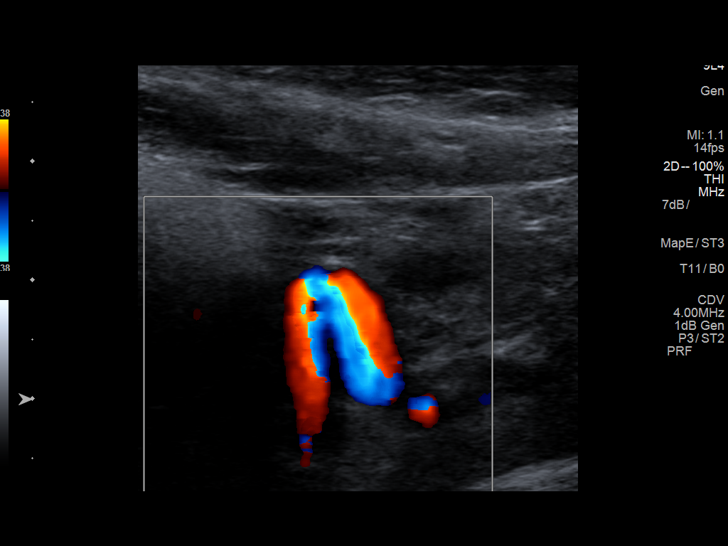
[im 67/81]
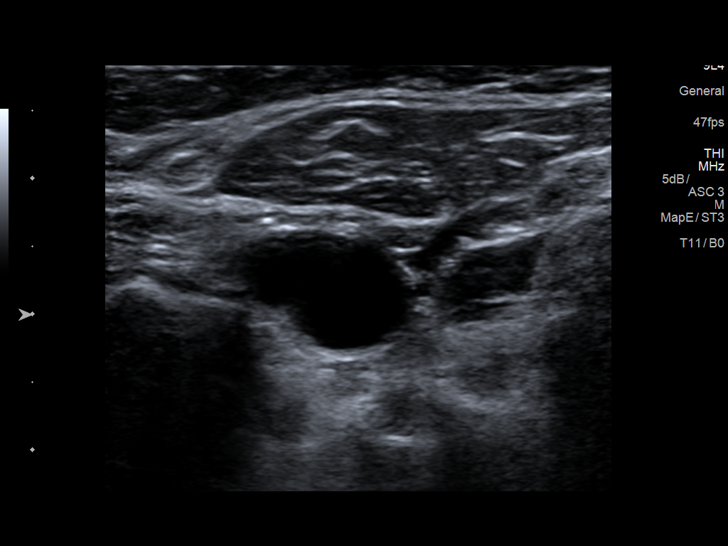
[im 74/81]
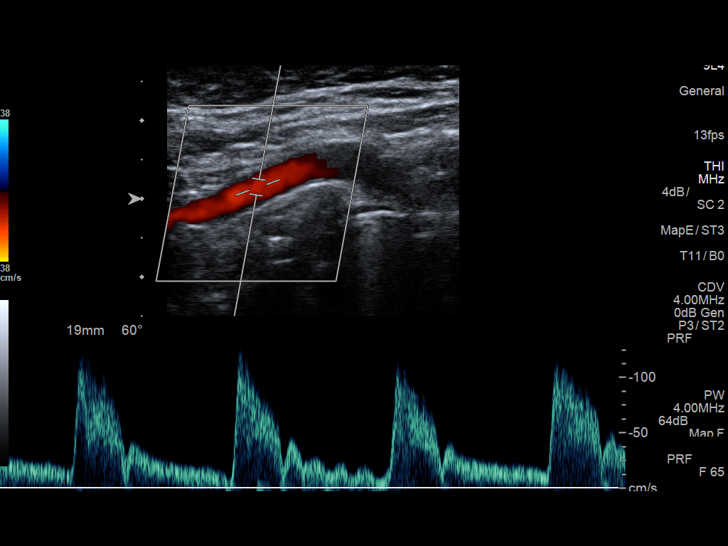
[im 81/81]
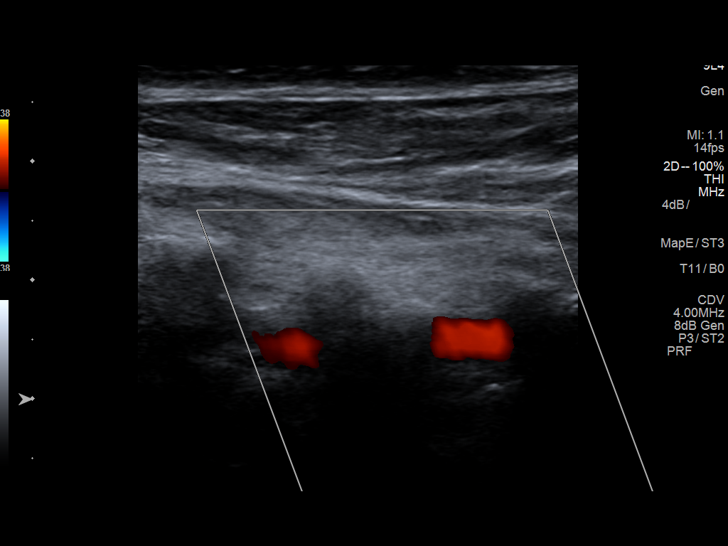

[13 of 24 positions shown; findings below may reference images not displayed]

FINDINGS: Criteria: Quantification of carotid stenosis is based on velocity
parameters that correlate the residual internal carotid diameter
with NASCET-based stenosis levels, using the diameter of the distal
internal carotid lumen as the denominator for stenosis measurement.

The following velocity measurements were obtained:

RIGHT

ICA:  163/40 cm/sec

CCA:  106/22 cm/sec

SYSTOLIC ICA/CCA RATIO:

DIASTOLIC ICA/CCA RATIO:

ECA:  86 cm/sec

LEFT

ICA:  141/41 cm/sec

CCA:  120/28 cm/sec

SYSTOLIC ICA/CCA RATIO:

DIASTOLIC ICA/CCA RATIO:

ECA:  105 cm/sec

RIGHT CAROTID ARTERY: Minor carotid atherosclerosis and intimal
thickening. Right ICA tortuosity noted. This appears to account for
the mild velocity elevation as above. No significant right ICA
stenosis, velocity elevation, or turbulent flow. Degree of narrowing
estimated less than 50%.

RIGHT VERTEBRAL ARTERY:  Antegrade

LEFT CAROTID ARTERY: Similar scattered minor echogenic plaque
formation. Diffuse carotid intimal thickening and distal carotid
tortuosity. No hemodynamically significant left ICA stenosis,
velocity elevation, or turbulent flow.

LEFT VERTEBRAL ARTERY:  Antegrade
IMPRESSION: Minor carotid atherosclerosis, intimal thickening and tortuosity. No
hemodynamically significant ICA stenosis. Degree of stenosis
estimated less than 50% bilaterally by ultrasound criteria.

Patent antegrade vertebral flow bilaterally.

## 2019-01-04 ENCOUNTER — Encounter: Payer: Self-pay | Admitting: Internal Medicine

## 2019-01-04 ENCOUNTER — Ambulatory Visit (INDEPENDENT_AMBULATORY_CARE_PROVIDER_SITE_OTHER): Payer: Medicare Other | Admitting: Internal Medicine

## 2019-01-04 VITALS — BP 126/78 | HR 71 | Temp 98.0°F | Resp 16 | Ht 60.0 in | Wt 143.2 lb

## 2019-01-04 DIAGNOSIS — E119 Type 2 diabetes mellitus without complications: Secondary | ICD-10-CM

## 2019-01-04 DIAGNOSIS — Z0000 Encounter for general adult medical examination without abnormal findings: Secondary | ICD-10-CM

## 2019-01-04 LAB — LIPID PANEL
Cholesterol: 216 mg/dL — ABNORMAL HIGH (ref 0–200)
HDL: 50.4 mg/dL (ref >39.00)
LDL CALC: 128 mg/dL — AB (ref 0–99)
NonHDL: 165.61
Total CHOL/HDL Ratio: 4
Triglycerides: 188 mg/dL — ABNORMAL HIGH (ref 0.0–149.0)
VLDL: 37.6 mg/dL (ref 0.0–40.0)

## 2019-01-04 LAB — COMPREHENSIVE METABOLIC PANEL
ALT: 16 U/L (ref 0–35)
AST: 17 U/L (ref 0–37)
Albumin: 4.1 g/dL (ref 3.5–5.2)
Alkaline Phosphatase: 64 U/L (ref 39–117)
BUN: 22 mg/dL (ref 6–23)
CO2: 31 mEq/L (ref 19–32)
CREATININE: 1.01 mg/dL (ref 0.40–1.20)
Calcium: 9.7 mg/dL (ref 8.4–10.5)
Chloride: 102 mEq/L (ref 96–112)
GFR: 52.71 mL/min — ABNORMAL LOW (ref >60.00)
Glucose, Bld: 115 mg/dL — ABNORMAL HIGH (ref 70–99)
Potassium: 4.1 mEq/L (ref 3.5–5.1)
Sodium: 140 mEq/L (ref 135–145)
Total Bilirubin: 0.5 mg/dL (ref 0.2–1.2)
Total Protein: 7.1 g/dL (ref 6.0–8.3)

## 2019-01-04 LAB — HEMOGLOBIN A1C: Hgb A1c MFr Bld: 6.8 % — ABNORMAL HIGH (ref 4.6–6.5)

## 2019-01-04 MED ORDER — TETANUS-DIPHTH-ACELL PERTUSSIS 5-2.5-18.5 LF-MCG/0.5 IM SUSP: 0.5000 mL | Freq: Once | INTRAMUSCULAR | 0 refills | Status: AC

## 2019-01-04 MED FILL — BOOSTRIX VACCINE SYRINGE: 5-2.5-18.5 | 1 days supply | Qty: 1 | Fill #0

## 2019-01-04 NOTE — Assessment & Plan Note (Signed)
PLAN:  DM: Since the last visit she reported GI symptoms from metformin 1000 mg BID,  dose decreased to 500 mg 3 times daily side effects stopped.  Ambulatory CBGs in the 120s.  Checking A1c HTN: Continue Zestoretic, checking labs Hyperlipidemia: See last visit, simvastatin was held, aches and pains definitely improved, now on Lipitor with no apparent side effects.  Checking FLP DJD: See last visit, labs okay, symptoms decreased after she stopped simva.  Good compliance with NSAIDs, no GI side effects, checking her kidney function today. Anxiety: On Lexapro, not needing Xanax, it was discontinued from her medication list. Allergies: Recommend consistent use of Flonase rtc 6 month

## 2019-01-04 NOTE — Progress Notes (Signed)
Pre visit review using our clinic review tool, if applicable. No additional management support is needed unless otherwise documented below in the visit note. 

## 2019-01-04 NOTE — Patient Instructions (Signed)
GO TO THE LAB : Get the blood work     GO TO THE FRONT DESK Schedule your next appointment   checkup in 6 months   

## 2019-01-04 NOTE — Assessment & Plan Note (Addendum)
-  Td 10-2009, Tdap RX printed  ; Pneumonia 2015; prevnar 2016; s/p  Zostavax ; s/p shingrix x 2 ; Had a Flu shot  Female care  All previous PAPs wnl, further screening no longer indicated per guidelines.  Asx. Last MMG --07/2018, practices SBE: wnl CCS: Cscope 2003, cscope 03-2006 wnl, no FH, pt elected no further screening -- Counseled: Diet, exercise, she is actually doing well. --Labs: CMP, FLP, A1c

## 2019-01-04 NOTE — Progress Notes (Signed)
Subjective:    Patient ID: Monique Bauer, female    DOB: May 25, 1936, 83 y.o.   MRN: [id number]  DOS:  01/04/2019 Type of visit - description: CPX No major concerns We talk about her cholesterol and diabetes medication.   Review of Systems Complains of sneezing, used to happen during the spring and fall but now is year-round. No new exposures. No cough, sinus pain or congestion.  Taking Zyrtec sometimes   Other than above, a 14 point review of systems is negative    Past Medical History:  Diagnosis Date  . Anxiety   . Cervical myelopathy ([place])   . Diabetes ([place]) 12-2012  . Family history of adverse reaction to anesthesia    had problems waking up pt father   . GERD (gastroesophageal reflux disease)   . History of hiatal hernia   . Hyperlipidemia   . Hypertension   . Osteoarthritis   . Osteopenia   . Spiradenoma     Past Surgical History:  Procedure Laterality Date  . ANTERIOR CERVICAL DECOMP/DISCECTOMY FUSION N/A 05/17/2017   Procedure: C4-5/5-6 ACDF;  Surgeon: Kary Kos, MD;  Location: [facility];  Service: Neurosurgery;  Laterality: N/A;  . CARPAL TUNNEL RELEASE Right 07/2017    Social History   Socioeconomic History  . Marital status: Married    Spouse name: Coralee Pesa  . Number of children: 3  . Years of education: Not on file  . Highest education level: Not on file  Occupational History  . Occupation: retired     Fish farm manager: RETIRED  Social Needs  . Financial resource strain: Not on file  . Food insecurity:    Worry: Not on file    Inability: Not on file  . Transportation needs:    Medical: Not on file    Non-medical: Not on file  Tobacco Use  . Smoking status: Former Smoker    Last attempt to quit: 10/15/1981    Years since quitting: 37.2  . Smokeless tobacco: Never Used  . Tobacco comment: was a social smoker  Substance and Sexual Activity  . Alcohol use: Yes    Comment: wine sometimes  . Drug use: No  . Sexual activity: Not Currently  Lifestyle  .  Physical activity:    Days per week: Not on file    Minutes per session: Not on file  . Stress: Not on file  Relationships  . Social connections:    Talks on phone: Not on file    Gets together: Not on file    Attends religious service: Not on file    Active member of club or organization: Not on file    Attends meetings of clubs or organizations: Not on file    Relationship status: Not on file  . Intimate partner violence:    Fear of current or ex partner: Not on file    Emotionally abused: Not on file    Physically abused: Not on file    Forced sexual activity: Not on file  Other Topics Concern  . Not on file  Social History Narrative   Lives w/ husband (married x 3);  3 daughters live in [place], 5 G-children, 1 GG son      Family History  Problem Relation Age of Onset  . Breast cancer Other        aunt   . Coronary artery disease Mother        M at age 49 and F  . Diabetes Mother  [place], daughter, sister  . Hypertension Mother   . Stroke Father   . Asthma Sister   . Colon cancer Neg Hx      Allergies as of 01/04/2019      Reactions   Oxycodone Hcl Itching, Rash   Sulfa Antibiotics Rash   Rash all over body    Sulfamethoxazole-trimethoprim Rash   rash all over      Medication List       Accurate as of January 04, 2019  5:29 PM. Always use your most recent med list.        aspirin 81 MG tablet Take 81 mg by mouth at bedtime.   atorvastatin 20 MG tablet Commonly known as:  LIPITOR Take 1 tablet (20 mg total) by mouth daily.   CALCIUM 500+D PO Take 1 tablet by mouth 2 (two) times daily.   escitalopram 10 MG tablet Commonly known as:  LEXAPRO TAKE 1 TABLET BY MOUTH  DAILY   GLUCOSAMINE-CHONDROITIN PO Take 1 tablet by mouth daily.   lisinopril-hydrochlorothiazide 10-12.5 MG tablet Commonly known as:  PRINZIDE,ZESTORETIC TAKE 1 TABLET BY MOUTH  DAILY   meloxicam 7.5 MG tablet Commonly known as:  MOBIC TAKE 1 TABLET BY MOUTH  DAILY AS NEEDED FOR  PAIN   metFORMIN 1000 MG tablet Commonly known as:  GLUCOPHAGE Take 0.5 tablets (500 mg total) by mouth 3 (three) times daily.   multivitamin with minerals Tabs tablet Take 1 tablet by mouth daily.   ONETOUCH DELICA LANCETS FINE Misc CHECK BLOOD SUGARS ONCE  DAILY.   ONETOUCH VERIO test strip Generic drug:  glucose blood CHECK BLOOD SUGARS ONCE  DAILY   pantoprazole 40 MG tablet Commonly known as:  PROTONIX TAKE 1 TABLET BY MOUTH  DAILY   Tdap 5-2.5-18.5 LF-MCG/0.5 injection Commonly known as:  BOOSTRIX Inject 0.5 mLs into the muscle once for 1 dose.           Objective:   Physical Exam BP 126/78 (BP Location: Left Arm, Patient Position: Sitting, Cuff Size: Small)   Pulse 71   Temp 98 F (36.7 C) (Oral)   Resp 16   Ht 5' (1.524 m)   Wt 143 lb 4 oz (65 kg)   SpO2 97%   BMI 27.98 kg/m   General: Well developed, NAD, BMI noted Neck: No  thyromegaly  HEENT:  Normocephalic . Face symmetric, atraumatic Lungs:  CTA B Normal respiratory effort, no intercostal retractions, no accessory muscle use. Heart: RRR,  no murmur.  No pretibial edema bilaterally  Abdomen:  Not distended, soft, non-tender. No rebound or rigidity.   Skin: Exposed areas without rash. Not pale. Not jaundice Neurologic:  alert & oriented X3.  Speech normal, gait appropriate for age and unassisted Strength symmetric and appropriate for age.  Psych: Cognition and judgment appear intact.  Cooperative with normal attention span and concentration.  Behavior appropriate. No anxious or depressed appearing.     Assessment      Assessment Diabetes 2014 HTN Hyperlipidemia Anxiety DJD GERD Osteopenia; dexa 07-2016 wnl.  Consider next DEXA 2022 Ao [place] (-)  AAA 2010 Cervical myelopathy, s/p surgery 05-2017 CTS s/p R surgery 2018  PLAN:  DM: Since the last visit she reported GI symptoms from metformin 1000 mg BID,  dose decreased to 500 mg 3 times daily side effects stopped.  Ambulatory CBGs  in the 120s.  Checking A1c HTN: Continue Zestoretic, checking labs Hyperlipidemia: See last visit, simvastatin was held, aches and pains definitely improved, now on  Lipitor with no apparent side effects.  Checking FLP DJD: See last visit, labs okay, symptoms decreased after she stopped simva.  Good compliance with NSAIDs, no GI side effects, checking her kidney function today. Anxiety: On Lexapro, not needing Xanax, it was discontinued from her medication list. Allergies: Recommend consistent use of Flonase rtc 6 month

## 2019-01-06 MED ORDER — ATORVASTATIN CALCIUM 40 MG PO TABS: 40.0000 mg | ORAL_TABLET | Freq: Every day | ORAL | 1 refills | Status: DC

## 2019-01-06 NOTE — Addendum Note (Signed)
Addended byDamita Dunnings D on: 01/06/2019 04:30 PM   Modules accepted: Orders

## 2019-01-18 ENCOUNTER — Other Ambulatory Visit: Payer: Self-pay | Admitting: Internal Medicine

## 2019-01-28 ENCOUNTER — Other Ambulatory Visit: Payer: Self-pay | Admitting: Internal Medicine

## 2019-02-02 ENCOUNTER — Other Ambulatory Visit: Payer: Self-pay | Admitting: Internal Medicine

## 2019-02-08 ENCOUNTER — Other Ambulatory Visit: Payer: Self-pay | Admitting: Internal Medicine

## 2019-04-07 ENCOUNTER — Other Ambulatory Visit: Payer: Self-pay | Admitting: Internal Medicine

## 2019-05-17 ENCOUNTER — Encounter: Payer: Self-pay | Admitting: Internal Medicine

## 2019-06-02 ENCOUNTER — Other Ambulatory Visit: Payer: Self-pay | Admitting: Internal Medicine

## 2019-06-28 ENCOUNTER — Other Ambulatory Visit: Payer: Self-pay | Admitting: Internal Medicine

## 2019-07-06 ENCOUNTER — Other Ambulatory Visit: Payer: Self-pay

## 2019-07-06 ENCOUNTER — Encounter: Payer: Self-pay | Admitting: Internal Medicine

## 2019-07-06 ENCOUNTER — Ambulatory Visit (INDEPENDENT_AMBULATORY_CARE_PROVIDER_SITE_OTHER): Payer: Medicare Other | Admitting: Internal Medicine

## 2019-07-06 VITALS — BP 151/59 | HR 63 | Temp 98.2°F | Resp 18 | Ht 60.0 in | Wt 142.2 lb

## 2019-07-06 DIAGNOSIS — E119 Type 2 diabetes mellitus without complications: Secondary | ICD-10-CM

## 2019-07-06 DIAGNOSIS — E785 Hyperlipidemia, unspecified: Secondary | ICD-10-CM | POA: Diagnosis not present

## 2019-07-06 DIAGNOSIS — I10 Essential (primary) hypertension: Secondary | ICD-10-CM

## 2019-07-06 LAB — CBC WITH DIFFERENTIAL/PLATELET
Basophils Absolute: 0 10*3/uL (ref 0.0–0.1)
Basophils Relative: 0.7 % (ref 0.0–3.0)
Eosinophils Absolute: 0.5 10*3/uL (ref 0.0–0.7)
Eosinophils Relative: 7 % — ABNORMAL HIGH (ref 0.0–5.0)
HCT: 38.2 % (ref 36.0–46.0)
Hemoglobin: 12.5 g/dL (ref 12.0–15.0)
Lymphocytes Relative: 27.3 % (ref 12.0–46.0)
Lymphs Abs: 1.9 10*3/uL (ref 0.7–4.0)
MCHC: 32.8 g/dL (ref 30.0–36.0)
MCV: 86.8 fl (ref 78.0–100.0)
Monocytes Absolute: 0.6 10*3/uL (ref 0.1–1.0)
Monocytes Relative: 8.2 % (ref 3.0–12.0)
Neutro Abs: 3.9 10*3/uL (ref 1.4–7.7)
Neutrophils Relative %: 56.8 % (ref 43.0–77.0)
Platelets: 254 10*3/uL (ref 150.0–400.0)
RBC: 4.4 Mil/uL (ref 3.87–5.11)
RDW: 13.6 % (ref 11.5–15.5)
WBC: 6.9 10*3/uL (ref 4.0–10.5)

## 2019-07-06 LAB — LIPID PANEL
Cholesterol: 156 mg/dL (ref 0–200)
HDL: 49.6 mg/dL (ref >39.00)
LDL Cholesterol: 74 mg/dL (ref 0–99)
NonHDL: 106.77
Total CHOL/HDL Ratio: 3
Triglycerides: 163 mg/dL — ABNORMAL HIGH (ref 0.0–149.0)
VLDL: 32.6 mg/dL (ref 0.0–40.0)

## 2019-07-06 LAB — COMPREHENSIVE METABOLIC PANEL
ALT: 16 U/L (ref 0–35)
AST: 16 U/L (ref 0–37)
Albumin: 4.2 g/dL (ref 3.5–5.2)
Alkaline Phosphatase: 63 U/L (ref 39–117)
BUN: 17 mg/dL (ref 6–23)
CO2: 29 mEq/L (ref 19–32)
Calcium: 9.7 mg/dL (ref 8.4–10.5)
Chloride: 101 mEq/L (ref 96–112)
Creatinine, Ser: 1.1 mg/dL (ref 0.40–1.20)
GFR: 47.7 mL/min — ABNORMAL LOW (ref >60.00)
Glucose, Bld: 104 mg/dL — ABNORMAL HIGH (ref 70–99)
Potassium: 4.1 mEq/L (ref 3.5–5.1)
Sodium: 139 mEq/L (ref 135–145)
Total Bilirubin: 0.5 mg/dL (ref 0.2–1.2)
Total Protein: 6.9 g/dL (ref 6.0–8.3)

## 2019-07-06 LAB — HEMOGLOBIN A1C: Hgb A1c MFr Bld: 6.9 % — ABNORMAL HIGH (ref 4.6–6.5)

## 2019-07-06 LAB — VITAMIN B12: Vitamin B-12: 708 pg/mL (ref 211–911)

## 2019-07-06 NOTE — Progress Notes (Signed)
Subjective:    Patient ID: Monique Bauer, female    DOB: February 24, 1936, 83 y.o.   MRN: [id number]  DOS:  07/06/2019 Type of visit - description: rov DM: good med compliance, CBGs very good as well.  She started taking B12 supplements as she is on metformin HTN: Good med compliance, ambulatory BPs at home consistently normal High cholesterol: Based on last lipid panel, Lipitor dose increased, good compliance without apparent side effects. anxiety: Well-controlled, on Lexapro.  Review of Systems Denies fever chills No chest pain or difficulty breathing No cough Patient following all the quarantine rules, this is somewhat distressful to her, feels occasional tired but no other symptoms.  Past Medical History:  Diagnosis Date   Anxiety    Cervical myelopathy ([place])    Diabetes ([place]) 12-2012   Family history of adverse reaction to anesthesia    had problems waking up pt father    GERD (gastroesophageal reflux disease)    History of hiatal hernia    Hyperlipidemia    Hypertension    Osteoarthritis    Osteopenia    Spiradenoma     Past Surgical History:  Procedure Laterality Date   ANTERIOR CERVICAL DECOMP/DISCECTOMY FUSION N/A 05/17/2017   Procedure: C4-5/5-6 ACDF;  Surgeon: Kary Kos, MD;  Location: [facility];  Service: Neurosurgery;  Laterality: N/A;   CARPAL TUNNEL RELEASE Right 07/2017    Social History   Socioeconomic History   Marital status: Married    Spouse name: Ronny   Number of children: 3   Years of education: Not on file   Highest education level: Not on file  Occupational History   Occupation: retired     Fish farm manager: RETIRED  Scientist, product/process development strain: Not on file   Food insecurity    Worry: Not on file    Inability: Not on file   Transportation needs    Medical: Not on file    Non-medical: Not on file  Tobacco Use   Smoking status: Former Smoker    Quit date: 10/15/1981    Years since quitting: 37.7   Smokeless  tobacco: Never Used   Tobacco comment: was a social smoker  Substance and Sexual Activity   Alcohol use: Yes    Comment: wine sometimes   Drug use: No   Sexual activity: Not Currently  Lifestyle   Physical activity    Days per week: Not on file    Minutes per session: Not on file   Stress: Not on file  Relationships   Social connections    Talks on phone: Not on file    Gets together: Not on file    Attends religious service: Not on file    Active member of club or organization: Not on file    Attends meetings of clubs or organizations: Not on file    Relationship status: Not on file   Intimate partner violence    Fear of current or ex partner: Not on file    Emotionally abused: Not on file    Physically abused: Not on file    Forced sexual activity: Not on file  Other Topics Concern   Not on file  Social History Narrative   Lives w/ husband (married x 3);  3 daughters live in [place], 5 G-children, 1 GG son       Allergies as of 07/06/2019      Reactions   Oxycodone Hcl Itching, Rash   Sulfa Antibiotics Rash   Rash  all over body    Sulfamethoxazole-trimethoprim Rash   rash all over      Medication List       Accurate as of July 06, 2019  8:04 AM. If you have any questions, ask your nurse or doctor.        aspirin 81 MG tablet Take 81 mg by mouth at bedtime.   atorvastatin 40 MG tablet Commonly known as: LIPITOR Take 1 tablet (40 mg total) by mouth at bedtime.   B-12 2500 MCG Tabs Take by mouth.   CALCIUM 500+D PO Take 1 tablet by mouth 2 (two) times daily.   escitalopram 10 MG tablet Commonly known as: LEXAPRO Take 1 tablet (10 mg total) by mouth daily.   GLUCOSAMINE-CHONDROITIN PO Take 1 tablet by mouth daily.   lisinopril-hydrochlorothiazide 10-12.5 MG tablet Commonly known as: ZESTORETIC Take 1 tablet by mouth daily.   meloxicam 7.5 MG tablet Commonly known as: MOBIC Take 1 tablet (7.5 mg total) by mouth daily as needed. for pain     metFORMIN 1000 MG tablet Commonly known as: GLUCOPHAGE Take 0.5 tablets (500 mg total) by mouth 3 (three) times daily with meals.   multivitamin with minerals Tabs tablet Take 1 tablet by mouth daily.   OneTouch Delica Plus [id number] Misc CHECK BLOOD SUGAR ONCE  DAILY   OneTouch Verio test strip Generic drug: glucose blood CHECK BLOOD SUGARS ONCE  DAILY   pantoprazole 40 MG tablet Commonly known as: PROTONIX Take 1 tablet (40 mg total) by mouth daily.           Objective:   Physical Exam BP (!) 151/59 (BP Location: Left Arm, Patient Position: Sitting, Cuff Size: Small)    Pulse 63    Temp 98.2 F (36.8 C) (Oral)    Resp 18    Ht 5' (1.524 m)    Wt 142 lb 4 oz (64.5 kg)    SpO2 97%    BMI 27.78 kg/m  General:   Well developed, NAD, BMI noted. HEENT:  Normocephalic . Face symmetric, atraumatic Lungs:  CTA B Normal respiratory effort, no intercostal retractions, no accessory muscle use. Heart: RRR,  no murmur.  No pretibial edema bilaterally  Skin: Not pale. Not jaundice Neurologic:  alert & oriented X3.  Speech normal, gait appropriate for age and unassisted Psych--  Cognition and judgment appear intact.  Cooperative with normal attention span and concentration.  Behavior appropriate. No anxious or depressed appearing.      Assessment    Assessment Diabetes 2014 HTN Hyperlipidemia Anxiety DJD GERD Osteopenia; dexa 07-2016 wnl.  Consider next DEXA 2022 Ao [place] (-)  AAA 2010 Cervical myelopathy, s/p surgery 05-2017 CTS s/p R surgery 2018  PLAN:  DM: Good compliance with metformin, on B12 supplements, check a A1c and B12. HTN: On Zestoretic, BP today slightly elevated, at home is consistently normal.  Check a CMP, CBC. High cholesterol: Based on last FLP, Lipitor increased, check a FLP. DJD: On daily meloxicam, follows GI precautions. Anxiety: Controlled on Lexapro. Preventive care: Encourage a flu shot as soon as is available RTC 12-2019 CPX

## 2019-07-06 NOTE — Patient Instructions (Addendum)
Please schedule Medicare Wellness with Glenard Haring.   GO TO THE LAB : Get the blood work     GO TO THE FRONT DESK Schedule your next appointment  For a physical by 12/2019

## 2019-07-06 NOTE — Assessment & Plan Note (Signed)
DM: Good compliance with metformin, on B12 supplements, check a A1c and B12. HTN: On Zestoretic, BP today slightly elevated, at home is consistently normal.  Check a CMP, CBC. High cholesterol: Based on last FLP, Lipitor increased, check a FLP. DJD: On daily meloxicam, follows GI precautions. Anxiety: Controlled on Lexapro. Preventive care: Encourage a flu shot as soon as is available RTC 12-2019 CPX

## 2019-07-06 NOTE — Progress Notes (Signed)
Pre visit review using our clinic review tool, if applicable. No additional management support is needed unless otherwise documented below in the visit note. 

## 2019-07-08 ENCOUNTER — Other Ambulatory Visit: Payer: Self-pay | Admitting: Internal Medicine

## 2019-07-19 ENCOUNTER — Other Ambulatory Visit: Payer: Self-pay | Admitting: Internal Medicine

## 2019-07-23 ENCOUNTER — Other Ambulatory Visit: Payer: Self-pay | Admitting: Internal Medicine

## 2019-08-07 LAB — HM MAMMOGRAPHY

## 2019-08-09 ENCOUNTER — Encounter: Payer: Self-pay | Admitting: Internal Medicine

## 2019-09-22 ENCOUNTER — Telehealth: Payer: Self-pay | Admitting: Internal Medicine

## 2019-09-22 NOTE — Telephone Encounter (Signed)
Called patient to schedule AWV no answer. Could not leave voicemail. SF will try to call back at a later time. SF

## 2019-10-12 ENCOUNTER — Other Ambulatory Visit: Payer: Self-pay | Admitting: Internal Medicine

## 2019-11-16 ENCOUNTER — Other Ambulatory Visit: Payer: Self-pay | Admitting: Internal Medicine

## 2019-12-10 ENCOUNTER — Other Ambulatory Visit: Payer: Self-pay | Admitting: Internal Medicine

## 2019-12-15 HISTORY — PX: CATARACT EXTRACTION: SUR2

## 2019-12-27 ENCOUNTER — Ambulatory Visit: Payer: Medicare Other | Attending: Internal Medicine

## 2019-12-27 DIAGNOSIS — Z23 Encounter for immunization: Secondary | ICD-10-CM | POA: Insufficient documentation

## 2019-12-27 NOTE — Progress Notes (Signed)
   Covid-19 Vaccination Clinic  Name:  Monique Bauer    MRN: [id number] DOB: [birth year]  12/27/2019  [name] was observed post Covid-19 immunization for 15 minutes without incidence. She was provided with Vaccine Information Sheet and instruction to access the V-Safe system.   [name] was instructed to call 911 with any severe reactions post vaccine: Marland Kitchen Difficulty breathing  . Swelling of your face and throat  . A fast heartbeat  . A bad rash all over your body  . Dizziness and weakness    Immunizations Administered    Name Date Dose VIS Date Route   Pfizer COVID-19 Vaccine 12/27/2019 10:53 AM 0.3 mL 11/24/2019 Intramuscular   Manufacturer: [place]   Lot: [id number]   [place]: [id number]

## 2020-01-11 ENCOUNTER — Other Ambulatory Visit: Payer: Self-pay

## 2020-01-11 ENCOUNTER — Ambulatory Visit (INDEPENDENT_AMBULATORY_CARE_PROVIDER_SITE_OTHER): Payer: Medicare Other | Admitting: Internal Medicine

## 2020-01-11 ENCOUNTER — Encounter: Payer: Self-pay | Admitting: Internal Medicine

## 2020-01-11 VITALS — BP 178/71 | HR 74 | Temp 95.6°F | Resp 16 | Ht 60.0 in | Wt 142.5 lb

## 2020-01-11 DIAGNOSIS — Z0000 Encounter for general adult medical examination without abnormal findings: Secondary | ICD-10-CM | POA: Diagnosis not present

## 2020-01-11 DIAGNOSIS — I10 Essential (primary) hypertension: Secondary | ICD-10-CM | POA: Diagnosis not present

## 2020-01-11 DIAGNOSIS — E119 Type 2 diabetes mellitus without complications: Secondary | ICD-10-CM | POA: Diagnosis not present

## 2020-01-11 DIAGNOSIS — R42 Dizziness and giddiness: Secondary | ICD-10-CM

## 2020-01-11 LAB — COMPREHENSIVE METABOLIC PANEL
ALT: 17 U/L (ref 0–35)
AST: 17 U/L (ref 0–37)
Albumin: 4 g/dL (ref 3.5–5.2)
Alkaline Phosphatase: 77 U/L (ref 39–117)
BUN: 23 mg/dL (ref 6–23)
CO2: 28 mEq/L (ref 19–32)
Calcium: 9.3 mg/dL (ref 8.4–10.5)
Chloride: 103 mEq/L (ref 96–112)
Creatinine, Ser: 1.11 mg/dL (ref 0.40–1.20)
GFR: 47.15 mL/min — ABNORMAL LOW (ref >60.00)
Glucose, Bld: 109 mg/dL — ABNORMAL HIGH (ref 70–99)
Potassium: 3.8 mEq/L (ref 3.5–5.1)
Sodium: 141 mEq/L (ref 135–145)
Total Bilirubin: 0.4 mg/dL (ref 0.2–1.2)
Total Protein: 6.7 g/dL (ref 6.0–8.3)

## 2020-01-11 LAB — HEMOGLOBIN A1C: Hgb A1c MFr Bld: 7.1 % — ABNORMAL HIGH (ref 4.6–6.5)

## 2020-01-11 LAB — VITAMIN B12: Vitamin B-12: >1500 pg/mL — ABNORMAL HIGH (ref 211–911)

## 2020-01-11 NOTE — Progress Notes (Signed)
Subjective:    Patient ID: Monique Bauer, female    DOB: October 16, 1936, 84 y.o.   MRN: [id number]  DOS:  01/11/2020 Type of visit - description: CPX Here for CPX Her BP at home has been elevated when check in the 165/80 range. Also 2 weeks ago noted dizziness whenever she is in bed and turning her head.  Described as a spinning,sxs  last few seconds, no associated nausea, denies double vision-slurred speech-motor deficits. She does have some paresthesia from the left neck to the left face since she had neck surgery 2 years ago and that has no change. Denies feet paresthesia.  BP Readings from Last 3 Encounters:  01/11/20 (!) 178/71  07/06/19 (!) 151/59  01/04/19 126/78    Review of Systems  Other than above, a 14 point review of systems is negative    Past Medical History:  Diagnosis Date  . Anxiety   . Cervical myelopathy ([place])   . Diabetes ([place]) 12-2012  . Family history of adverse reaction to anesthesia    had problems waking up pt father   . GERD (gastroesophageal reflux disease)   . History of hiatal hernia   . Hyperlipidemia   . Hypertension   . Osteoarthritis   . Osteopenia   . Spiradenoma     Past Surgical History:  Procedure Laterality Date  . ANTERIOR CERVICAL DECOMP/DISCECTOMY FUSION N/A 05/17/2017   Procedure: C4-5/5-6 ACDF;  Surgeon: Kary Kos, MD;  Location: [facility];  Service: Neurosurgery;  Laterality: N/A;  . CARPAL TUNNEL RELEASE Right 07/2017   Family History  Problem Relation Age of Onset  . Breast cancer Other        aunt   . Coronary artery disease Mother        M at age 37 and F  . Diabetes Mother        M, F, daughter, sister  . Hypertension Mother   . Stroke Father   . Asthma Sister   . Colon cancer Neg Hx         Objective:   Physical Exam BP (!) 178/71 (BP Location: Left Arm, Patient Position: Sitting, Cuff Size: Small)   Pulse 74   Temp (!) 95.6 F (35.3 C) (Temporal)   Resp 16   Ht 5' (1.524 m)   Wt 142 lb 8 oz (64.6 kg)    SpO2 98%   BMI 27.83 kg/m  General: Well developed, NAD, BMI noted Neck: No  thyromegaly.  Normal carotid pulses HEENT:  Normocephalic . Face symmetric, atraumatic Lungs:  CTA B Normal respiratory effort, no intercostal retractions, no accessory muscle use. Heart: RRR,  no murmur.  No pretibial edema bilaterally  Abdomen:  Not distended, soft, non-tender. No rebound or rigidity.   Skin: Exposed areas without rash. Not pale. Not jaundice DM foot exam: No edema, good pedal pulses, pinprick normal. Neurologic:  alert & oriented X3.  Speech normal, gait appropriate for age and unassisted Strength symmetric and appropriate for age. EOMI. Psych: Cognition and judgment appear intact.  Cooperative with normal attention span and concentration.  Behavior appropriate. No anxious or depressed appearing.     Assessment      Assessment Diabetes 2014 HTN Hyperlipidemia Anxiety DJD GERD Osteopenia; dexa 07-2016 wnl.  Consider next DEXA 2022 Ao [place] (-)  AAA 2010 Cervical myelopathy, s/p surgery 05-2017 CTS s/p R surgery 2018  PLAN:  Here for CPX Diabetes: Currently on Metformin, check A1c and B12 (she is taking supplements).  Feet exam  negative today HTN: BP today 178/81 at home has been in the 160s.  Recommend to increase physical activity, increase lisinopril HCT 10-12.5 mg to two tablets daily.  Monitor BPs.  BMP today and in 2 weeks. She takes meloxicam daily, that may affect her BP but she is getting great benefit from NSAIDs. DJD: As above, benefiting from NSAIDs Anxiety: On Lexapro, controlled. Dizziness: As described above, likely peripheral, observation, call if symptoms change RTC 2 weeks for labs RTC for office visit 3 months    This visit occurred during the SARS-CoV-2 public health emergency.  Safety protocols were in place, including screening questions prior to the visit, additional usage of staff PPE, and extensive cleaning of exam room while observing  appropriate contact time as indicated for disinfecting solutions.

## 2020-01-11 NOTE — Patient Instructions (Addendum)
Please schedule Medicare Wellness with Glenard Haring.  Per our records you are due for an eye exam. Please contact your eye doctor to schedule an appointment. Please have them send copies of your office visit notes to [place]. Our fax number is [phone].  GO TO THE LAB : Get the blood work     [place] Please come back in 2 weeks for blood work only.  Make an appointment  Please come back for office visit in 3 months.  Make an appointment  Increase lisinopril HCT to 2 tablets daily. Continue checking your blood pressures at home 3-4 times a week.  BP GOAL is between 110/65 and  135/85. If it is consistently higher or lower, let me know

## 2020-01-11 NOTE — Progress Notes (Signed)
Pre visit review using our clinic review tool, if applicable. No additional management support is needed unless otherwise documented below in the visit note. 

## 2020-01-12 NOTE — Assessment & Plan Note (Signed)
Here for CPX Diabetes: Currently on Metformin, check A1c and B12 (she is taking supplements).  Feet exam negative today HTN: BP today 178/81 at home has been in the 160s.  Recommend to increase physical activity, increase lisinopril HCT 10-12.5 mg to two tablets daily.  Monitor BPs.  BMP today and in 2 weeks. She takes meloxicam daily, that may affect her BP but she is getting great benefit from NSAIDs. DJD: As above, benefiting from NSAIDs Anxiety: On Lexapro, controlled. Dizziness: As described above, likely peripheral, observation, call if symptoms change RTC 2 weeks for labs RTC for office visit 3 months

## 2020-01-12 NOTE — Assessment & Plan Note (Signed)
-   Tdap 2020 - Pneumonia 2015; prevnar 2016 - s/p  Zostavax ; s/p shingrix x 2  - Had a Flu shot  Female care  Cervical cancer screening no longer indicated. 3D MMG 07-2019 normal CCS: Cscope 2003, cscope 03-2006 wnl, no FH, pt elected no further screening -- Lifestyle: Less active than before, encouraged to take daily walks weather permits . --Labs:  CMP, A1c, B12

## 2020-01-16 ENCOUNTER — Ambulatory Visit: Payer: Medicare Other | Attending: Internal Medicine

## 2020-01-16 DIAGNOSIS — Z23 Encounter for immunization: Secondary | ICD-10-CM

## 2020-01-16 NOTE — Progress Notes (Signed)
   Covid-19 Vaccination Clinic  Name:  Monique Bauer    MRN: [id number] DOB: 10/22/1936  01/16/2020  [name] was observed post Covid-19 immunization for 15 minutes without incidence. She was provided with Vaccine Information Sheet and instruction to access the V-Safe system.   [name] was instructed to call 911 with any severe reactions post vaccine: Marland Kitchen Difficulty breathing  . Swelling of your face and throat  . A fast heartbeat  . A bad rash all over your body  . Dizziness and weakness    Immunizations Administered    Name Date Dose VIS Date Route   Pfizer COVID-19 Vaccine 01/16/2020  9:11 AM 0.3 mL 11/24/2019 Intramuscular   Manufacturer: [place]   Lot: [id number]   [place]: [id number]

## 2020-01-26 ENCOUNTER — Other Ambulatory Visit (INDEPENDENT_AMBULATORY_CARE_PROVIDER_SITE_OTHER): Payer: Medicare Other

## 2020-01-26 ENCOUNTER — Other Ambulatory Visit: Payer: Self-pay

## 2020-01-26 DIAGNOSIS — I10 Essential (primary) hypertension: Secondary | ICD-10-CM | POA: Diagnosis not present

## 2020-01-26 LAB — BASIC METABOLIC PANEL
BUN: 24 mg/dL — ABNORMAL HIGH (ref 6–23)
CO2: 29 mEq/L (ref 19–32)
Calcium: 9.6 mg/dL (ref 8.4–10.5)
Chloride: 103 mEq/L (ref 96–112)
Creatinine, Ser: 1.09 mg/dL (ref 0.40–1.20)
GFR: 48.14 mL/min — ABNORMAL LOW (ref >60.00)
Glucose, Bld: 105 mg/dL — ABNORMAL HIGH (ref 70–99)
Potassium: 3.9 mEq/L (ref 3.5–5.1)
Sodium: 141 mEq/L (ref 135–145)

## 2020-03-04 ENCOUNTER — Encounter: Payer: Self-pay | Admitting: Internal Medicine

## 2020-03-04 LAB — HM DIABETES EYE EXAM

## 2020-03-19 ENCOUNTER — Other Ambulatory Visit: Payer: Self-pay | Admitting: Internal Medicine

## 2020-03-22 ENCOUNTER — Other Ambulatory Visit: Payer: Self-pay | Admitting: Internal Medicine

## 2020-04-01 ENCOUNTER — Encounter: Payer: Self-pay | Admitting: Internal Medicine

## 2020-04-01 MED ORDER — LISINOPRIL-HYDROCHLOROTHIAZIDE 10-12.5 MG PO TABS: 2.0000 | ORAL_TABLET | Freq: Every day | ORAL | 0 refills | Status: DC

## 2020-04-01 MED FILL — LISINOPRIL-HCTZ 10-12.5 MG: 10-12.5 | 15 days supply | Qty: 30 | Fill #0

## 2020-04-02 ENCOUNTER — Encounter: Payer: Self-pay | Admitting: Internal Medicine

## 2020-04-02 LAB — HM DIABETES EYE EXAM

## 2020-04-08 ENCOUNTER — Other Ambulatory Visit: Payer: Self-pay

## 2020-04-08 ENCOUNTER — Ambulatory Visit (INDEPENDENT_AMBULATORY_CARE_PROVIDER_SITE_OTHER): Payer: Medicare Other | Admitting: Internal Medicine

## 2020-04-08 ENCOUNTER — Encounter: Payer: Self-pay | Admitting: Internal Medicine

## 2020-04-08 VITALS — BP 145/60 | HR 70 | Temp 96.5°F | Resp 18 | Ht 60.0 in | Wt 140.2 lb

## 2020-04-08 DIAGNOSIS — I10 Essential (primary) hypertension: Secondary | ICD-10-CM | POA: Diagnosis not present

## 2020-04-08 DIAGNOSIS — E119 Type 2 diabetes mellitus without complications: Secondary | ICD-10-CM | POA: Diagnosis not present

## 2020-04-08 DIAGNOSIS — R110 Nausea: Secondary | ICD-10-CM | POA: Diagnosis not present

## 2020-04-08 DIAGNOSIS — M8949 Other hypertrophic osteoarthropathy, multiple sites: Secondary | ICD-10-CM

## 2020-04-08 DIAGNOSIS — M159 Polyosteoarthritis, unspecified: Secondary | ICD-10-CM

## 2020-04-08 LAB — CBC WITH DIFFERENTIAL/PLATELET
Basophils Absolute: 0.1 10*3/uL (ref 0.0–0.1)
Basophils Relative: 0.7 % (ref 0.0–3.0)
Eosinophils Absolute: 0.5 10*3/uL (ref 0.0–0.7)
Eosinophils Relative: 6.2 % — ABNORMAL HIGH (ref 0.0–5.0)
HCT: 37.8 % (ref 36.0–46.0)
Hemoglobin: 12.5 g/dL (ref 12.0–15.0)
Lymphocytes Relative: 23.9 % (ref 12.0–46.0)
Lymphs Abs: 2 10*3/uL (ref 0.7–4.0)
MCHC: 33.2 g/dL (ref 30.0–36.0)
MCV: 85.4 fl (ref 78.0–100.0)
Monocytes Absolute: 0.7 10*3/uL (ref 0.1–1.0)
Monocytes Relative: 8.1 % (ref 3.0–12.0)
Neutro Abs: 5.1 10*3/uL (ref 1.4–7.7)
Neutrophils Relative %: 61.1 % (ref 43.0–77.0)
Platelets: 288 10*3/uL (ref 150.0–400.0)
RBC: 4.42 Mil/uL (ref 3.87–5.11)
RDW: 14.6 % (ref 11.5–15.5)
WBC: 8.4 10*3/uL (ref 4.0–10.5)

## 2020-04-08 LAB — HEMOGLOBIN A1C: Hgb A1c MFr Bld: 7 % — ABNORMAL HIGH (ref 4.6–6.5)

## 2020-04-08 MED ORDER — MELOXICAM 7.5 MG PO TABS: 7.5000 mg | ORAL_TABLET | Freq: Every day | ORAL | 0 refills | Status: DC

## 2020-04-08 NOTE — Progress Notes (Signed)
Pre visit review using our clinic review tool, if applicable. No additional management support is needed unless otherwise documented below in the visit note. 

## 2020-04-08 NOTE — Progress Notes (Signed)
Subjective:    Patient ID: Monique Bauer, female    DOB: July 08, 1936, 84 y.o.   MRN: [id number]  DOS:  04/08/2020 Type of visit - description: Routine checkup Today we talk about hypertension, diabetes, DJD.  Also for a while is having occasional mild nausea in the afternoons. Decreased with foods sometimes. No vomiting, diarrhea.  No change in the color of the stools.  DJD controlled with meloxicam daily  Review of Systems Occasional headaches, only in the last few days after she had cataract surgery.  Past Medical History:  Diagnosis Date  . Anxiety   . Cervical myelopathy ([place])   . Diabetes ([place]) 12-2012  . Family history of adverse reaction to anesthesia    had problems waking up pt father   . GERD (gastroesophageal reflux disease)   . History of hiatal hernia   . Hyperlipidemia   . Hypertension   . Osteoarthritis   . Osteopenia   . Spiradenoma     Past Surgical History:  Procedure Laterality Date  . ANTERIOR CERVICAL DECOMP/DISCECTOMY FUSION N/A 05/17/2017   Procedure: C4-5/5-6 ACDF;  Surgeon: Kary Kos, MD;  Location: [facility];  Service: Neurosurgery;  Laterality: N/A;  . CARPAL TUNNEL RELEASE Right 07/2017    Allergies as of 04/08/2020      Reactions   Oxycodone Hcl Itching, Rash   Sulfa Antibiotics Rash   Rash all over body    Sulfamethoxazole-trimethoprim Rash   rash all over      Medication List       Accurate as of April 08, 2020 11:59 PM. If you have any questions, ask your nurse or doctor.        aspirin 81 MG tablet Take 81 mg by mouth at bedtime.   atorvastatin 40 MG tablet Commonly known as: LIPITOR Take 1 tablet (40 mg total) by mouth at bedtime.   B-12 2500 MCG Tabs Take by mouth.   CALCIUM 500+D PO Take 1 tablet by mouth 2 (two) times daily.   escitalopram 10 MG tablet Commonly known as: LEXAPRO TAKE 1 TABLET BY MOUTH  DAILY   GLUCOSAMINE-CHONDROITIN PO Take 1 tablet by mouth daily.   lisinopril-hydrochlorothiazide 10-12.5  MG tablet Commonly known as: ZESTORETIC Take 2 tablets by mouth daily.   meloxicam 7.5 MG tablet Commonly known as: MOBIC Take 1 tablet (7.5 mg total) by mouth daily. What changed:   when to take this  reasons to take this Changed by: Kathlene November, MD   metFORMIN 1000 MG tablet Commonly known as: GLUCOPHAGE TAKE 1/2 TABLET BY MOUTH 3  TIMES DAILY WITH MEALS   multivitamin with minerals Tabs tablet Take 1 tablet by mouth daily.   OneTouch Delica Plus 123456 Misc CHECK BLOOD SUGAR ONCE  DAILY   OneTouch Verio test strip Generic drug: glucose blood CHECK BLOOD SUGAR ONCE  DAILY   pantoprazole 40 MG tablet Commonly known as: PROTONIX Take 1 tablet (40 mg total) by mouth daily.          Objective:   Physical Exam BP (!) 145/60 (BP Location: Left Arm, Patient Position: Sitting, Cuff Size: Small)   Pulse 70   Temp (!) 96.5 F (35.8 C) (Temporal)   Resp 18   Ht 5' (1.524 m)   Wt 140 lb 4 oz (63.6 kg)   SpO2 99%   BMI 27.39 kg/m  General:   Well developed, NAD, BMI noted. HEENT:  Normocephalic . Face symmetric, atraumatic Lungs:  CTA B Normal respiratory effort, no intercostal  retractions, no accessory muscle use. Heart: RRR,  no murmur.  Abdomen: Soft, nontender. Lower extremities: no pretibial edema bilaterally  Skin: Not pale. Not jaundice Neurologic:  alert & oriented X3.  Speech normal, gait appropriate for age and unassisted Psych--  Cognition and judgment appear intact.  Cooperative with normal attention span and concentration.  Behavior appropriate. No anxious or depressed appearing.      Assessment     Assessment Diabetes 2014 HTN Hyperlipidemia Anxiety DJD GERD Osteopenia; dexa 07-2016 wnl.  Consider next DEXA 2022 Ao [place] (-)  AAA 2010 Cervical myelopathy, s/p surgery 05-2017 CTS s/p R surgery 2018  PLAN:  DM: On Metformin, last A1c 7.1, check A1c. HTN: Since the last office visit, BMP was okay, on Zestoretic, BP today satisfactory,  reports good ambulatory BPs.  No change.  Check a CBC. Nausea: In the afternoons, mild, no weight loss noted, no blood in the stools or change in the color of the stools.  She does take meloxicam 15 mg daily and is on PPIs. Plan: Decrease meloxicam to 7.5 mg, continue PPIs, checking a CBC today DJD: Decreasing NSAIDs, see above.  Hopefully DJD sxs will continue to be well controlled Dizziness: See last visit: Resolved. RTC 6 months     This visit occurred during the SARS-CoV-2 public health emergency.  Safety protocols were in place, including screening questions prior to the visit, additional usage of staff PPE, and extensive cleaning of exam room while observing appropriate contact time as indicated for disinfecting solutions.

## 2020-04-08 NOTE — Patient Instructions (Addendum)
Please schedule Medicare Wellness with Glenard Haring.   Per our records you are due for an eye exam. Please contact your eye doctor to schedule an appointment. Please have them send copies of your office visit notes to [place]. Our fax number is [phone].  Continue checking your blood pressures BP GOAL is between 110/65 and  135/85. If it is consistently higher or lower, let me know  Decrease meloxicam to 7.5 mg 1 tablet daily with food  GO TO THE LAB : Get the blood work     [place], [place] back for   a checkup in 6 months

## 2020-04-09 NOTE — Assessment & Plan Note (Signed)
DM: On Metformin, last A1c 7.1, check A1c. HTN: Since the last office visit, BMP was okay, on Zestoretic, BP today satisfactory, reports good ambulatory BPs.  No change.  Check a CBC. Nausea: In the afternoons, mild, no weight loss noted, no blood in the stools or change in the color of the stools.  She does take meloxicam 15 mg daily and is on PPIs. Plan: Decrease meloxicam to 7.5 mg, continue PPIs, checking a CBC today DJD: Decreasing NSAIDs, see above.  Hopefully DJD sxs will continue to be well controlled Dizziness: See last visit: Resolved. RTC 6 months

## 2020-04-15 ENCOUNTER — Other Ambulatory Visit: Payer: Self-pay | Admitting: Internal Medicine

## 2020-04-15 MED ORDER — LISINOPRIL-HYDROCHLOROTHIAZIDE 10-12.5 MG PO TABS: 2.0000 | ORAL_TABLET | Freq: Every day | ORAL | 0 refills | Status: DC

## 2020-04-15 MED FILL — LISINOPRIL-HCTZ 10-12.5 MG: 10-12.5 | 30 days supply | Qty: 60 | Fill #0

## 2020-04-26 ENCOUNTER — Other Ambulatory Visit: Payer: Self-pay | Admitting: Internal Medicine

## 2020-05-09 ENCOUNTER — Telehealth: Payer: Self-pay | Admitting: Internal Medicine

## 2020-05-09 NOTE — Progress Notes (Signed)
  Chronic Care Management   Outreach Note  05/09/2020 Name: Monique Bauer MRN: [id number] DOB: 10/29/1936  Referred by: Colon Branch, MD Reason for referral : No chief complaint on file.   An unsuccessful telephone outreach was attempted today. The patient was referred to the pharmacist for assistance with care management and care coordination.   This note is not being shared with the patient for the following reason: To respect privacy (The patient or proxy has requested that the information not be shared).  Follow Up Plan:   Earney [place] [employer] Scheduler

## 2020-05-23 ENCOUNTER — Encounter: Payer: Self-pay | Admitting: Internal Medicine

## 2020-05-23 MED ORDER — LISINOPRIL-HYDROCHLOROTHIAZIDE 10-12.5 MG PO TABS: 2.0000 | ORAL_TABLET | Freq: Every day | ORAL | 1 refills | Status: DC

## 2020-05-24 ENCOUNTER — Telehealth: Payer: Self-pay

## 2020-05-24 NOTE — Telephone Encounter (Signed)
PA approved.  Request Reference Number: [id number]. LISINOP/HCTZ TAB 10-12.5 is approved through 12/13/2020. Your patient may now fill this prescription and it will be covered

## 2020-05-24 NOTE — Telephone Encounter (Signed)
PA initiated via Covermymeds; KEY: [id number]. Awaiting determination.

## 2020-06-11 ENCOUNTER — Other Ambulatory Visit: Payer: Self-pay | Admitting: Internal Medicine

## 2020-06-14 ENCOUNTER — Telehealth: Payer: Self-pay | Admitting: Internal Medicine

## 2020-06-14 NOTE — Progress Notes (Signed)
  Chronic Care Management   Outreach Note  06/14/2020 Name: Monique Bauer MRN: [id number] DOB: 05-12-1936  Referred by: Colon Branch, MD Reason for referral : No chief complaint on file.   A second unsuccessful telephone outreach was attempted today. The patient was referred to pharmacist for assistance with care management and care coordination.  Follow Up Plan:   [place]

## 2020-07-25 ENCOUNTER — Telehealth: Payer: Self-pay | Admitting: Internal Medicine

## 2020-07-25 NOTE — Progress Notes (Signed)
  Chronic Care Management   Outreach Note  07/25/2020 Name: Monique Bauer MRN: [id number] DOB: Mar 01, 1936  Referred by: Colon Branch, MD Reason for referral : No chief complaint on file.   Third unsuccessful telephone outreach was attempted today. The patient was referred to the pharmacist for assistance with care management and care coordination.   Follow Up Plan:   Carley Perdue [employer] Scheduler

## 2020-08-07 LAB — HM MAMMOGRAPHY

## 2020-08-14 ENCOUNTER — Encounter: Payer: Self-pay | Admitting: Internal Medicine

## 2020-08-30 LAB — HM DEXA SCAN

## 2020-09-09 ENCOUNTER — Encounter: Payer: Self-pay | Admitting: Internal Medicine

## 2020-10-07 ENCOUNTER — Ambulatory Visit: Payer: Medicare Other | Admitting: Internal Medicine

## 2020-10-08 ENCOUNTER — Ambulatory Visit: Payer: Medicare Other | Admitting: Internal Medicine

## 2020-10-09 ENCOUNTER — Other Ambulatory Visit: Payer: Self-pay | Admitting: Internal Medicine

## 2020-10-16 ENCOUNTER — Ambulatory Visit (INDEPENDENT_AMBULATORY_CARE_PROVIDER_SITE_OTHER): Payer: Medicare Other | Admitting: Internal Medicine

## 2020-10-16 ENCOUNTER — Other Ambulatory Visit: Payer: Self-pay

## 2020-10-16 ENCOUNTER — Encounter: Payer: Self-pay | Admitting: Internal Medicine

## 2020-10-16 VITALS — BP 122/76 | HR 67 | Temp 97.9°F | Resp 16 | Ht 60.0 in | Wt 127.1 lb

## 2020-10-16 DIAGNOSIS — E119 Type 2 diabetes mellitus without complications: Secondary | ICD-10-CM

## 2020-10-16 DIAGNOSIS — I10 Essential (primary) hypertension: Secondary | ICD-10-CM | POA: Diagnosis not present

## 2020-10-16 DIAGNOSIS — F419 Anxiety disorder, unspecified: Secondary | ICD-10-CM | POA: Diagnosis not present

## 2020-10-16 DIAGNOSIS — E785 Hyperlipidemia, unspecified: Secondary | ICD-10-CM | POA: Diagnosis not present

## 2020-10-16 NOTE — Progress Notes (Signed)
Subjective:    Patient ID: Monique Bauer, female    DOB: 1936-12-09, 84 y.o.   MRN: [id number]  DOS:  10/16/2020 Type of visit - description: Routine checkup Feels very well.  She reports easy bruising but denies any vaginal bleeding, nosebleeds, blood in the urine or stools.  We reviewed her immunizations  Is doing great with diet and exercise and has lost some weight.  Feels she does not need Lexapro anymore   Review of Systems Denies nausea, vomiting. No GERD.  Pain well controlled with meloxicam daily  Past Medical History:  Diagnosis Date  . Anxiety   . Cervical myelopathy ([place])   . Diabetes ([place]) 12-2012  . Family history of adverse reaction to anesthesia    had problems waking up pt father   . GERD (gastroesophageal reflux disease)   . History of hiatal hernia   . Hyperlipidemia   . Hypertension   . Osteoarthritis   . Osteopenia   . Spiradenoma     Past Surgical History:  Procedure Laterality Date  . ANTERIOR CERVICAL DECOMP/DISCECTOMY FUSION N/A 05/17/2017   Procedure: C4-5/5-6 ACDF;  Surgeon: Kary Kos, MD;  Location: [facility];  Service: Neurosurgery;  Laterality: N/A;  . CARPAL TUNNEL RELEASE Right 07/2017    Allergies as of 10/16/2020      Reactions   Oxycodone Hcl Itching, Rash   Sulfa Antibiotics Rash   Rash all over body    Sulfamethoxazole-trimethoprim Rash   rash all over      Medication List       Accurate as of October 16, 2020 11:59 PM. If you have any questions, ask your nurse or doctor.        STOP taking these medications   aspirin 81 MG tablet Stopped by: Kathlene November, MD     TAKE these medications   atorvastatin 40 MG tablet Commonly known as: LIPITOR Take 1 tablet (40 mg total) by mouth at bedtime.   B-12 2500 MCG Tabs Take by mouth.   CALCIUM 500+D PO Take 1 tablet by mouth 2 (two) times daily.   escitalopram 10 MG tablet Commonly known as: LEXAPRO TAKE 1 TABLET BY MOUTH  DAILY   GLUCOSAMINE-CHONDROITIN PO Take 1  tablet by mouth daily.   lisinopril-hydrochlorothiazide 10-12.5 MG tablet Commonly known as: ZESTORETIC Take 2 tablets by mouth daily.   meloxicam 7.5 MG tablet Commonly known as: MOBIC Take 1 tablet (7.5 mg total) by mouth daily as needed for pain.   metFORMIN 1000 MG tablet Commonly known as: GLUCOPHAGE TAKE 1/2 TABLET BY MOUTH 3  TIMES DAILY WITH MEALS   multivitamin with minerals Tabs tablet Take 1 tablet by mouth daily.   OneTouch Delica Plus [id number] Misc CHECK BLOOD SUGAR ONCE  DAILY   OneTouch Verio test strip Generic drug: glucose blood CHECK BLOOD SUGAR ONCE  DAILY   pantoprazole 40 MG tablet Commonly known as: PROTONIX Take 1 tablet (40 mg total) by mouth daily.          Objective:   Physical Exam BP 122/76 (BP Location: Left Arm, Patient Position: Sitting, Cuff Size: Small)   Pulse 67   Temp 97.9 F (36.6 C) (Oral)   Resp 16   Ht 5' (1.524 m)   Wt 127 lb 2 oz (57.7 kg)   SpO2 99%   BMI 24.83 kg/m  General:   Well developed, NAD, BMI noted. HEENT:  Normocephalic . Face symmetric, atraumatic Lungs:  CTA B Normal respiratory effort, no intercostal retractions,  no accessory muscle use. Heart: RRR,  no murmur.  Lower extremities: no pretibial edema bilaterally  Skin: Very superficial ecchymoses in the arms consistent with capillary fragility Neurologic:  alert & oriented X3.  Speech normal, gait appropriate for age and unassisted Psych--  Cognition and judgment appear intact.  Cooperative with normal attention span and concentration.  Behavior appropriate. No anxious or depressed appearing.      Assessment     Assessment Diabetes 2014 HTN Hyperlipidemia Anxiety DJD GERD Osteopenia; dexa 07-2016, 2021 wnl.   Ao [place] (-)  AAA 2010 Cervical myelopathy, s/p surgery 05-2017 CTS s/p R surgery 2018  PLAN:  DM: Doing great with diet, exercise, on Metformin, check A1c HTN: Reports excellent ambulatory BPs, on Zestoretic, check a CMP High  cholesterol: On atorvastatin, check FLP. DJD: On meloxicam daily, we agreed to switch to Tylenol as needed, and take meloxicam with GI precautions the  days that the pain is more than usual. Anxiety: Feels like she is doing great and would like to stop Lexapro.  Decrease to 5 mg daily for 2 - 3 weeks then stop.  Okay to go back on it if needed. Aspirin: We agreed to stop it, no history of CAD or CVA. Weight management: She belongs to [employer], a weight management group and needs a goal: We agreed to get to 122 pounds by December 2022.  Letter signed. Immunizations: Had COVID vaccines including a booster, had a flu shot. RTC CPX 5 months     This visit occurred during the SARS-CoV-2 public health emergency.  Safety protocols were in place, including screening questions prior to the visit, additional usage of staff PPE, and extensive cleaning of exam room while observing appropriate contact time as indicated for disinfecting solutions.

## 2020-10-16 NOTE — Patient Instructions (Addendum)
Happy belated Rudene Anda!  Check the  blood pressure   BP GOAL is between 110/65 and  135/85. If it is consistently higher or lower, let me know  Lexapro:  Decrease to half tablet a day for 2 to 3 weeks and then you can stop. If you feel it was helping you you can go back on it  Stop ASPIRIN  Pain, arthritis -Tylenol  500 mg OTC 2 tabs a day every 8 hours as needed for pain -meloxicam as needed    GO TO THE LAB : Get the blood work     GO TO THE FRONT DESK, [place] back for a physical in 4 to 5 months

## 2020-10-16 NOTE — Progress Notes (Signed)
Pre visit review using our clinic review tool, if applicable. No additional management support is needed unless otherwise documented below in the visit note. 

## 2020-10-17 LAB — COMPREHENSIVE METABOLIC PANEL
AG Ratio: 1.4 (calc) (ref 1.0–2.5)
ALT: 17 U/L (ref 6–29)
AST: 18 U/L (ref 10–35)
Albumin: 4.3 g/dL (ref 3.6–5.1)
Alkaline phosphatase (APISO): 65 U/L (ref 37–153)
BUN/Creatinine Ratio: 31 (calc) — ABNORMAL HIGH (ref 6–22)
BUN: 36 mg/dL — ABNORMAL HIGH (ref 7–25)
CO2: 30 mmol/L (ref 20–32)
Calcium: 10.3 mg/dL (ref 8.6–10.4)
Chloride: 102 mmol/L (ref 98–110)
Creat: 1.17 mg/dL — ABNORMAL HIGH (ref 0.60–0.88)
Globulin: 3 g/dL (calc) (ref 1.9–3.7)
Glucose, Bld: 112 mg/dL — ABNORMAL HIGH (ref 65–99)
Potassium: 4.3 mmol/L (ref 3.5–5.3)
Sodium: 141 mmol/L (ref 135–146)
Total Bilirubin: 0.5 mg/dL (ref 0.2–1.2)
Total Protein: 7.3 g/dL (ref 6.1–8.1)

## 2020-10-17 LAB — HEMOGLOBIN A1C
Hgb A1c MFr Bld: 6.1 % of total Hgb — ABNORMAL HIGH (ref <5.7)
Mean Plasma Glucose: 128 (calc)
eAG (mmol/L): 7.1 (calc)

## 2020-10-17 LAB — LIPID PANEL
Cholesterol: 170 mg/dL (ref <200)
HDL: 52 mg/dL (ref >=50)
LDL Cholesterol (Calc): 93 mg/dL (calc)
Non-HDL Cholesterol (Calc): 118 mg/dL (calc) (ref <130)
Total CHOL/HDL Ratio: 3.3 (calc) (ref <5.0)
Triglycerides: 148 mg/dL (ref <150)

## 2020-10-17 NOTE — Assessment & Plan Note (Signed)
DM: Doing great with diet, exercise, on Metformin, check A1c HTN: Reports excellent ambulatory BPs, on Zestoretic, check a CMP High cholesterol: On atorvastatin, check FLP. DJD: On meloxicam daily, we agreed to switch to Tylenol as needed, and take meloxicam with GI precautions the  days that the pain is more than usual. Anxiety: Feels like she is doing great and would like to stop Lexapro.  Decrease to 5 mg daily for 2 - 3 weeks then stop.  Okay to go back on it if needed. Aspirin: We agreed to stop it, no history of CAD or CVA. Weight management: She belongs to [employer], a weight management group and needs a goal: We agreed to get to 122 pounds by December 2022.  Letter signed. Immunizations: Had COVID vaccines including a booster, had a flu shot. RTC CPX 5 months

## 2020-11-14 ENCOUNTER — Other Ambulatory Visit: Payer: Self-pay | Admitting: Internal Medicine

## 2020-12-03 ENCOUNTER — Other Ambulatory Visit: Payer: Self-pay | Admitting: Internal Medicine

## 2021-01-03 ENCOUNTER — Encounter: Payer: Self-pay | Admitting: Internal Medicine

## 2021-02-12 ENCOUNTER — Other Ambulatory Visit: Payer: Self-pay | Admitting: Internal Medicine

## 2021-03-17 ENCOUNTER — Ambulatory Visit: Payer: Medicare Other | Admitting: Internal Medicine

## 2021-03-20 ENCOUNTER — Ambulatory Visit: Payer: Medicare Other | Attending: Internal Medicine

## 2021-03-20 DIAGNOSIS — Z23 Encounter for immunization: Secondary | ICD-10-CM

## 2021-03-20 NOTE — Progress Notes (Signed)
   Covid-19 Vaccination Clinic  Name:  Monique Bauer    MRN: [id number] DOB: 04/11/1936  03/20/2021  [name] was observed post Covid-19 immunization for 15 minutes without incident. She was provided with Vaccine Information Sheet and instruction to access the V-Safe system.   [name] was instructed to call 911 with any severe reactions post vaccine: Marland Kitchen Difficulty breathing  . Swelling of face and throat  . A fast heartbeat  . A bad rash all over body  . Dizziness and weakness   Immunizations Administered    Name Date Dose VIS Date Route   PFIZER Comrnaty([place] TOP) Covid-19 Vaccine 03/20/2021 10:58 AM 0.3 mL 11/21/2020 Intramuscular   Manufacturer: [place]   Lot: [id number]   [place]: [phone]

## 2021-03-25 ENCOUNTER — Other Ambulatory Visit: Payer: Self-pay | Admitting: Internal Medicine

## 2021-03-26 ENCOUNTER — Other Ambulatory Visit (HOSPITAL_BASED_OUTPATIENT_CLINIC_OR_DEPARTMENT_OTHER): Payer: Self-pay

## 2021-03-26 LAB — HM DIABETES EYE EXAM

## 2021-03-26 MED ORDER — LATANOPROST 0.005 % OP SOLN
OPHTHALMIC | 11 refills | Status: DC
  Filled 2021-03-26: qty 2.5, 25d supply, fill #0
  Filled 2021-04-23: qty 2.5, 25d supply, fill #1
  Filled 2021-05-19: qty 2.5, 25d supply, fill #2
  Filled 2021-06-30: qty 2.5, 25d supply, fill #3
  Filled 2021-08-07: qty 2.5, 25d supply, fill #4
  Filled 2021-09-01: qty 2.5, 25d supply, fill #5
  Filled 2021-09-27: qty 2.5, 25d supply, fill #6
  Filled 2021-10-25: qty 2.5, 25d supply, fill #7
  Filled 2021-12-02: qty 2.5, 25d supply, fill #8
  Filled 2022-01-02: qty 2.5, 25d supply, fill #9
  Filled 2022-01-29: qty 2.5, 25d supply, fill #10
  Filled 2022-03-12: qty 2.5, 25d supply, fill #11

## 2021-03-27 ENCOUNTER — Other Ambulatory Visit (HOSPITAL_BASED_OUTPATIENT_CLINIC_OR_DEPARTMENT_OTHER): Payer: Self-pay

## 2021-03-27 MED ORDER — PFIZER-BIONT COVID-19 VAC-TRIS 30 MCG/0.3ML IM SUSP
INTRAMUSCULAR | 0 refills | Status: DC
Start: 2021-03-20 — End: 2021-04-10
  Filled 2021-03-27: qty 0.3, 1d supply, fill #0

## 2021-03-28 ENCOUNTER — Other Ambulatory Visit (HOSPITAL_BASED_OUTPATIENT_CLINIC_OR_DEPARTMENT_OTHER): Payer: Self-pay

## 2021-04-09 ENCOUNTER — Encounter: Payer: Self-pay | Admitting: Internal Medicine

## 2021-04-09 DIAGNOSIS — H409 Unspecified glaucoma: Secondary | ICD-10-CM | POA: Insufficient documentation

## 2021-04-10 ENCOUNTER — Other Ambulatory Visit: Payer: Self-pay

## 2021-04-10 ENCOUNTER — Encounter: Payer: Self-pay | Admitting: Internal Medicine

## 2021-04-10 ENCOUNTER — Ambulatory Visit (INDEPENDENT_AMBULATORY_CARE_PROVIDER_SITE_OTHER): Payer: Medicare Other | Admitting: Internal Medicine

## 2021-04-10 VITALS — BP 126/74 | HR 66 | Temp 98.1°F | Resp 16 | Ht 60.0 in | Wt 129.1 lb

## 2021-04-10 DIAGNOSIS — Z0000 Encounter for general adult medical examination without abnormal findings: Secondary | ICD-10-CM | POA: Diagnosis not present

## 2021-04-10 DIAGNOSIS — E785 Hyperlipidemia, unspecified: Secondary | ICD-10-CM | POA: Diagnosis not present

## 2021-04-10 DIAGNOSIS — I10 Essential (primary) hypertension: Secondary | ICD-10-CM | POA: Diagnosis not present

## 2021-04-10 DIAGNOSIS — E119 Type 2 diabetes mellitus without complications: Secondary | ICD-10-CM | POA: Diagnosis not present

## 2021-04-10 LAB — CBC WITH DIFFERENTIAL/PLATELET
Basophils Absolute: 0.1 10*3/uL (ref 0.0–0.1)
Basophils Relative: 0.9 % (ref 0.0–3.0)
Eosinophils Absolute: 0.4 10*3/uL (ref 0.0–0.7)
Eosinophils Relative: 6.7 % — ABNORMAL HIGH (ref 0.0–5.0)
HCT: 38.1 % (ref 36.0–46.0)
Hemoglobin: 12.6 g/dL (ref 12.0–15.0)
Lymphocytes Relative: 28 % (ref 12.0–46.0)
Lymphs Abs: 1.9 10*3/uL (ref 0.7–4.0)
MCHC: 33.2 g/dL (ref 30.0–36.0)
MCV: 85.1 fl (ref 78.0–100.0)
Monocytes Absolute: 0.5 10*3/uL (ref 0.1–1.0)
Monocytes Relative: 6.8 % (ref 3.0–12.0)
Neutro Abs: 3.9 10*3/uL (ref 1.4–7.7)
Neutrophils Relative %: 57.6 % (ref 43.0–77.0)
Platelets: 264 10*3/uL (ref 150.0–400.0)
RBC: 4.47 Mil/uL (ref 3.87–5.11)
RDW: 13.9 % (ref 11.5–15.5)
WBC: 6.7 10*3/uL (ref 4.0–10.5)

## 2021-04-10 LAB — BASIC METABOLIC PANEL
BUN: 24 mg/dL — ABNORMAL HIGH (ref 6–23)
CO2: 31 mEq/L (ref 19–32)
Calcium: 9.7 mg/dL (ref 8.4–10.5)
Chloride: 103 mEq/L (ref 96–112)
Creatinine, Ser: 1.13 mg/dL (ref 0.40–1.20)
GFR: 45.31 mL/min — ABNORMAL LOW (ref >60.00)
Glucose, Bld: 104 mg/dL — ABNORMAL HIGH (ref 70–99)
Potassium: 4 mEq/L (ref 3.5–5.1)
Sodium: 141 mEq/L (ref 135–145)

## 2021-04-10 LAB — TSH: TSH: 2.64 u[IU]/mL (ref 0.35–4.50)

## 2021-04-10 LAB — HEMOGLOBIN A1C: Hgb A1c MFr Bld: 6.8 % — ABNORMAL HIGH (ref 4.6–6.5)

## 2021-04-10 NOTE — Patient Instructions (Signed)
Continue checking your blood pressure BP GOAL is between 110/65 and  135/85. If it is consistently higher or lower, let me know  Help with sleep: Okay to take Benadryl 12.5 mg at night if needed  HEALTHY SLEEP Sleep hygiene: Basic rules for a good night's sleep  Sleep only as much as you need to feel rested and then get out of bed  Keep a regular sleep schedule  Avoid forcing sleep  Exercise regularly for at least 20 minutes, preferably 4 to 5 hours before bedtime  Avoid caffeinated beverages after lunch  Avoid alcohol near bedtime: no "night cap"  Avoid smoking, especially in the evening  Do not go to bed hungry  Adjust bedroom environment  Avoid prolonged use of light-emitting screens before bedtime   Deal with your worries before bedtime      GO TO THE LAB : Get the blood work     GO TO THE FRONT DESK, [place] back for   a checkup in 6 months

## 2021-04-10 NOTE — Progress Notes (Signed)
Subjective:    Patient ID: Monique Bauer, female    DOB: 1936-12-04, 85 y.o.   MRN: [id number]  DOS:  04/10/2021 Type of visit - description: CPX  Since the last office visit she is doing well, here for CPX. In general she feels well. Does take meloxicam daily without GI symptoms. + Stress, some insomnia  Review of Systems  Other than above, a 14 point review of systems is negative      Past Medical History:  Diagnosis Date  . Anxiety   . Cervical myelopathy ([place])   . Diabetes ([place]) 12-2012  . Family history of adverse reaction to anesthesia    had problems waking up pt father   . GERD (gastroesophageal reflux disease)   . Glaucoma   . History of hiatal hernia   . Hyperlipidemia   . Hypertension   . Osteoarthritis   . Osteopenia   . Spiradenoma     Past Surgical History:  Procedure Laterality Date  . ANTERIOR CERVICAL DECOMP/DISCECTOMY FUSION N/A 05/17/2017   Procedure: C4-5/5-6 ACDF;  Surgeon: Kary Kos, MD;  Location: [facility];  Service: Neurosurgery;  Laterality: N/A;  . CARPAL TUNNEL RELEASE Right 07/2017   Social History   Socioeconomic History  . Marital status: Married    Spouse name: Coralee Pesa  . Number of children: 3  . Years of education: Not on file  . Highest education level: Not on file  Occupational History  . Occupation: retired     Fish farm manager: RETIRED  Tobacco Use  . Smoking status: Former Smoker    Quit date: 10/15/1981    Years since quitting: 39.5  . Smokeless tobacco: Never Used  . Tobacco comment: was a social smoker  Vaping Use  . Vaping Use: Never used  Substance and Sexual Activity  . Alcohol use: Yes    Comment: wine sometimes  . Drug use: No  . Sexual activity: Not Currently  Other Topics Concern  . Not on file  Social History Narrative   Lives w/ husband (married x 3);  3 daughters live in [place], 5 G-children, 1 GG son    Social Determinants of Health   Financial Resource Strain: Not on file  Food Insecurity: Not on file   Transportation Needs: Not on file  Physical Activity: Not on file  Stress: Not on file  Social Connections: Not on file  Intimate Partner Violence: Not on file    Allergies as of 04/10/2021      Reactions   Oxycodone Hcl Itching, Rash   Sulfa Antibiotics Rash   Rash all over body    Sulfamethoxazole-trimethoprim Rash   rash all over      Medication List       Accurate as of April 10, 2021 11:59 PM. If you have any questions, ask your nurse or doctor.        STOP taking these medications   escitalopram 10 MG tablet Commonly known as: LEXAPRO Stopped by: Kathlene November, MD   Pfizer-BioNT COVID-19 Vac-TriS Susp injection Generic drug: COVID-19 mRNA Vac-TriS Therapist, music) Stopped by: Kathlene November, MD     TAKE these medications   atorvastatin 40 MG tablet Commonly known as: LIPITOR Take 1 tablet (40 mg total) by mouth at bedtime.   B-12 2500 MCG Tabs Take by mouth.   CALCIUM 500+D PO Take 1 tablet by mouth 2 (two) times daily.   GLUCOSAMINE-CHONDROITIN PO Take 1 tablet by mouth daily.   latanoprost 0.005 % ophthalmic solution Commonly known as:  XALATAN Place 1 drop into both eyes nightly.   lisinopril-hydrochlorothiazide 10-12.5 MG tablet Commonly known as: ZESTORETIC Take 2 tablets by mouth daily.   meloxicam 7.5 MG tablet Commonly known as: MOBIC Take 1 tablet (7.5 mg total) by mouth daily as needed for pain.   metFORMIN 1000 MG tablet Commonly known as: GLUCOPHAGE Take 0.5 tablets (500 mg total) by mouth with breakfast, with lunch, and with evening meal.   multivitamin with minerals Tabs tablet Take 1 tablet by mouth daily.   OneTouch Delica Plus [id number] Misc CHECK BLOOD SUGAR ONCE  DAILY   OneTouch Verio test strip Generic drug: glucose blood CHECK BLOOD SUGAR ONCE  DAILY   pantoprazole 40 MG tablet Commonly known as: PROTONIX Take 1 tablet (40 mg total) by mouth daily.          Objective:   Physical Exam BP 126/74 (BP Location: Left Arm, Patient  Position: Sitting, Cuff Size: Small)   Pulse 66   Temp 98.1 F (36.7 C) (Oral)   Resp 16   Ht 5' (1.524 m)   Wt 129 lb 2 oz (58.6 kg)   SpO2 97%   BMI 25.22 kg/m  General: Well developed, NAD, BMI noted Neck: No  thyromegaly  HEENT:  Normocephalic . Face symmetric, atraumatic Lungs:  CTA B Normal respiratory effort, no intercostal retractions, no accessory muscle use. Heart: RRR,  no murmur.  Abdomen:  Not distended, soft, non-tender. No rebound or rigidity.   Lower extremities: no pretibial edema bilaterally  Skin: Exposed areas without rash. Not pale. Not jaundice Neurologic:  alert & oriented X3.  Speech normal, gait appropriate for age and unassisted Strength symmetric and appropriate for age.  Psych: Cognition and judgment appear intact.  Cooperative with normal attention span and concentration.  Behavior appropriate. No anxious or depressed appearing.     Assessment      Assessment Diabetes 2014 HTN Hyperlipidemia Anxiety DJD GERD Osteopenia; dexa 07-2016, 2021 wnl.   Ao [place] (-)  AAA 2010 Cervical myelopathy, s/p surgery 05-2017 CTS s/p R surgery 2018 Glaucoma, early macular degeneration   PLAN: Here for CPX DM: On metformin, checking labs. Hyperlipidemia: On atorvastatin, well-controlled per last FLP Anxiety: Related to stress due to family issues, listening therapy provided, self discontinue Lexapro, feels that she can manage on her own.  Has some insomnia, taking melatonin, rec  a low-dose Benadryl if needed.  Sleep tips provided. DJD: On daily meloxicam, no GI side effects, good GI precautions, recommended stay active, stretching. RTC 6 months    This visit occurred during the SARS-CoV-2 public health emergency.  Safety protocols were in place, including screening questions prior to the visit, additional usage of staff PPE, and extensive cleaning of exam room while observing appropriate contact time as indicated for disinfecting solutions.

## 2021-04-11 ENCOUNTER — Encounter: Payer: Self-pay | Admitting: Internal Medicine

## 2021-04-11 NOTE — Assessment & Plan Note (Signed)
Here for CPX DM: On metformin, checking labs. Hyperlipidemia: On atorvastatin, well-controlled per last FLP Anxiety: Related to stress due to family issues, listening therapy provided, self discontinue Lexapro, feels that she can manage on her own.  Has some insomnia, taking melatonin, rec  a low-dose Benadryl if needed.  Sleep tips provided. DJD: On daily meloxicam, no GI side effects, good GI precautions, recommended stay active, stretching. RTC 6 months

## 2021-04-11 NOTE — Assessment & Plan Note (Signed)
-   Tdap 2020 - Pneumonia 2015; prevnar 2016 - s/pZostavax; s/p shingrix x 2  -COVID VAX x4 Female care  Cervical cancer screening no longer indicated.   MMG 08/07/2020.  [facility]  pt elected no further screening --Labs:  BMP, CBC, A1c, TSH, -Diet exercise: Discussed

## 2021-04-23 ENCOUNTER — Other Ambulatory Visit (HOSPITAL_BASED_OUTPATIENT_CLINIC_OR_DEPARTMENT_OTHER): Payer: Self-pay

## 2021-04-24 ENCOUNTER — Other Ambulatory Visit (HOSPITAL_BASED_OUTPATIENT_CLINIC_OR_DEPARTMENT_OTHER): Payer: Self-pay

## 2021-05-07 ENCOUNTER — Other Ambulatory Visit: Payer: Self-pay | Admitting: Internal Medicine

## 2021-05-19 ENCOUNTER — Other Ambulatory Visit (HOSPITAL_BASED_OUTPATIENT_CLINIC_OR_DEPARTMENT_OTHER): Payer: Self-pay

## 2021-05-20 ENCOUNTER — Other Ambulatory Visit (HOSPITAL_BASED_OUTPATIENT_CLINIC_OR_DEPARTMENT_OTHER): Payer: Self-pay

## 2021-07-01 ENCOUNTER — Other Ambulatory Visit (HOSPITAL_BASED_OUTPATIENT_CLINIC_OR_DEPARTMENT_OTHER): Payer: Self-pay

## 2021-07-15 ENCOUNTER — Other Ambulatory Visit: Payer: Self-pay | Admitting: Internal Medicine

## 2021-08-07 ENCOUNTER — Other Ambulatory Visit (HOSPITAL_BASED_OUTPATIENT_CLINIC_OR_DEPARTMENT_OTHER): Payer: Self-pay

## 2021-08-13 LAB — HM MAMMOGRAPHY

## 2021-08-14 ENCOUNTER — Encounter: Payer: Self-pay | Admitting: Internal Medicine

## 2021-09-01 ENCOUNTER — Other Ambulatory Visit (HOSPITAL_BASED_OUTPATIENT_CLINIC_OR_DEPARTMENT_OTHER): Payer: Self-pay

## 2021-09-04 ENCOUNTER — Ambulatory Visit: Payer: Medicare Other | Attending: Internal Medicine

## 2021-09-04 DIAGNOSIS — Z23 Encounter for immunization: Secondary | ICD-10-CM

## 2021-09-04 NOTE — Progress Notes (Signed)
   Covid-19 Vaccination Clinic  Name:  Monique Bauer    MRN: [id number] DOB: [birth year]  09/04/2021  Monique Bauer was observed post Covid-19 immunization for 15 minutes without incident. She was provided with Vaccine Information Sheet and instruction to access the V-Safe system.   Monique Bauer was instructed to call 911 with any severe reactions post vaccine: Difficulty breathing  Swelling of face and throat  A fast heartbeat  A bad rash all over body  Dizziness and weakness

## 2021-09-08 ENCOUNTER — Other Ambulatory Visit: Payer: Self-pay | Admitting: Internal Medicine

## 2021-09-09 ENCOUNTER — Other Ambulatory Visit: Payer: Self-pay | Admitting: Internal Medicine

## 2021-09-09 MED ORDER — ATORVASTATIN CALCIUM 40 MG PO TABS: 40.0000 mg | ORAL_TABLET | Freq: Every day | ORAL | 1 refills | Status: DC

## 2021-09-10 ENCOUNTER — Other Ambulatory Visit (HOSPITAL_BASED_OUTPATIENT_CLINIC_OR_DEPARTMENT_OTHER): Payer: Self-pay

## 2021-09-10 MED ORDER — COVID-19MRNA BIVAL VACC PFIZER 30 MCG/0.3ML IM SUSP
INTRAMUSCULAR | 0 refills | Status: DC
  Filled 2021-09-10: qty 0.3, 1d supply, fill #0

## 2021-09-29 ENCOUNTER — Other Ambulatory Visit (HOSPITAL_BASED_OUTPATIENT_CLINIC_OR_DEPARTMENT_OTHER): Payer: Self-pay

## 2021-10-10 ENCOUNTER — Ambulatory Visit (INDEPENDENT_AMBULATORY_CARE_PROVIDER_SITE_OTHER): Payer: Medicare Other | Admitting: Internal Medicine

## 2021-10-10 ENCOUNTER — Encounter: Payer: Self-pay | Admitting: Internal Medicine

## 2021-10-10 ENCOUNTER — Other Ambulatory Visit: Payer: Self-pay

## 2021-10-10 VITALS — BP 132/80 | HR 78 | Temp 98.0°F | Resp 16 | Ht 60.0 in | Wt 134.0 lb

## 2021-10-10 DIAGNOSIS — E119 Type 2 diabetes mellitus without complications: Secondary | ICD-10-CM

## 2021-10-10 DIAGNOSIS — E785 Hyperlipidemia, unspecified: Secondary | ICD-10-CM | POA: Diagnosis not present

## 2021-10-10 DIAGNOSIS — I10 Essential (primary) hypertension: Secondary | ICD-10-CM | POA: Diagnosis not present

## 2021-10-10 LAB — COMPREHENSIVE METABOLIC PANEL
ALT: 33 U/L (ref 0–35)
AST: 27 U/L (ref 0–37)
Albumin: 4.2 g/dL (ref 3.5–5.2)
Alkaline Phosphatase: 96 U/L (ref 39–117)
BUN: 38 mg/dL — ABNORMAL HIGH (ref 6–23)
CO2: 30 mEq/L (ref 19–32)
Calcium: 9.6 mg/dL (ref 8.4–10.5)
Chloride: 102 mEq/L (ref 96–112)
Creatinine, Ser: 1.42 mg/dL — ABNORMAL HIGH (ref 0.40–1.20)
GFR: 34.33 mL/min — ABNORMAL LOW (ref >60.00)
Glucose, Bld: 113 mg/dL — ABNORMAL HIGH (ref 70–99)
Potassium: 4.3 mEq/L (ref 3.5–5.1)
Sodium: 142 mEq/L (ref 135–145)
Total Bilirubin: 0.5 mg/dL (ref 0.2–1.2)
Total Protein: 7.3 g/dL (ref 6.0–8.3)

## 2021-10-10 LAB — LIPID PANEL
Cholesterol: 162 mg/dL (ref 0–200)
HDL: 48.7 mg/dL (ref >39.00)
LDL Cholesterol: 83 mg/dL (ref 0–99)
NonHDL: 113.51
Total CHOL/HDL Ratio: 3
Triglycerides: 151 mg/dL — ABNORMAL HIGH (ref 0.0–149.0)
VLDL: 30.2 mg/dL (ref 0.0–40.0)

## 2021-10-10 LAB — HEMOGLOBIN A1C: Hgb A1c MFr Bld: 6.8 % — ABNORMAL HIGH (ref 4.6–6.5)

## 2021-10-10 MED ORDER — METFORMIN HCL 1000 MG PO TABS: 500.0000 mg | ORAL_TABLET | Freq: Three times a day (TID) | ORAL | 1 refills | Status: DC

## 2021-10-10 NOTE — Progress Notes (Signed)
Subjective:    Patient ID: Monique Bauer, female    DOB: May 05, 1936, 85 y.o.   MRN: [id number]  DOS:  10/10/2021 Type of visit - description: f/u Since the last office visit she is doing well. Takes meloxicam as needed only, with food. Ambulatory CBGs in the morning are very good. She remains moderately active. Room for improvement on diet, mild weight gain noted   Wt Readings from Last 3 Encounters:  10/10/21 134 lb (60.8 kg)  04/10/21 129 lb 2 oz (58.6 kg)  10/16/20 127 lb 2 oz (57.7 kg)    Review of Systems See above   Past Medical History:  Diagnosis Date   Anxiety    Cervical myelopathy ([place])    Diabetes ([place]) 12-2012   Family history of adverse reaction to anesthesia    had problems waking up pt father    GERD (gastroesophageal reflux disease)    Glaucoma    History of hiatal hernia    Hyperlipidemia    Hypertension    Osteoarthritis    Osteopenia    Spiradenoma     Past Surgical History:  Procedure Laterality Date   ANTERIOR CERVICAL DECOMP/DISCECTOMY FUSION N/A 05/17/2017   Procedure: C4-5/5-6 ACDF;  Surgeon: Kary Kos, MD;  Location: [facility];  Service: Neurosurgery;  Laterality: N/A;   CARPAL TUNNEL RELEASE Right 07/2017    Allergies as of 10/10/2021       Reactions   Oxycodone Hcl Itching, Rash   Sulfa Antibiotics Rash   Rash all over body    Sulfamethoxazole-trimethoprim Rash   rash all over        Medication List        Accurate as of October 10, 2021 11:59 PM. If you have any questions, ask your nurse or doctor.          STOP taking these medications    Pfizer COVID-19 Vac Bivalent injection Generic drug: COVID-19 mRNA bivalent vaccine Therapist, music) Stopped by: Kathlene November, MD       TAKE these medications    atorvastatin 40 MG tablet Commonly known as: LIPITOR Take 1 tablet (40 mg total) by mouth at bedtime.   B-12 2500 MCG Tabs Take by mouth.   CALCIUM 500+D PO Take 1 tablet by mouth 2 (two) times daily.    GLUCOSAMINE-CHONDROITIN PO Take 1 tablet by mouth daily.   latanoprost 0.005 % ophthalmic solution Commonly known as: XALATAN Place 1 drop into both eyes nightly.   lisinopril-hydrochlorothiazide 10-12.5 MG tablet Commonly known as: ZESTORETIC TAKE 2 TABLETS BY MOUTH  DAILY   meloxicam 7.5 MG tablet Commonly known as: MOBIC Take 1 tablet (7.5 mg total) by mouth daily as needed for pain.   metFORMIN 1000 MG tablet Commonly known as: GLUCOPHAGE Take 0.5 tablets (500 mg total) by mouth with breakfast, with lunch, and with evening meal.   multivitamin with minerals Tabs tablet Take 1 tablet by mouth daily.   OneTouch Delica Plus [id number] Misc CHECK BLOOD SUGAR ONCE  DAILY   OneTouch Verio test strip Generic drug: glucose blood CHECK BLOOD SUGAR ONCE  DAILY   pantoprazole 40 MG tablet Commonly known as: PROTONIX Take 1 tablet (40 mg total) by mouth daily.           Objective:   Physical Exam BP 132/80 (BP Location: Left Arm, Patient Position: Sitting, Cuff Size: Small)   Pulse 78   Temp 98 F (36.7 C) (Oral)   Resp 16   Ht 5' (1.524 m)  Wt 134 lb (60.8 kg)   SpO2 97%   BMI 26.17 kg/m  General:   Well developed, NAD, BMI noted. HEENT:  Normocephalic . Face symmetric, atraumatic Lungs:  CTA B Normal respiratory effort, no intercostal retractions, no accessory muscle use. Heart: RRR,  no murmur.  Lower extremities: no pretibial edema bilaterally  Skin: Not pale. Not jaundice Neurologic:  alert & oriented X3.  Speech normal, gait appropriate for age and unassisted Psych--  Cognition and judgment appear intact.  Cooperative with normal attention span and concentration.  Behavior appropriate. No anxious or depressed appearing.      Assessment    Assessment Diabetes 2014 HTN Hyperlipidemia Anxiety DJD GERD Osteopenia; dexa 07-2016, 2021 wnl.   Ao [place] (-)  AAA 2010 Cervical myelopathy, s/p surgery 05-2017 CTS s/p R surgery 2018 Glaucoma, early  macular degeneration   PLAN: DM: On metformin 500 mg 3 times daily, has gained some weight, will check A1c, consider increase metformin dose.  Encouraged to keep active and go back to a healthier diet. HTN: Well-controlled, no change, on Zestoretic, check CMP High cholesterol: On Lipitor, check FLP Preventive care: Update on immunizations RTC 6 months    This visit occurred during the SARS-CoV-2 public health emergency.  Safety protocols were in place, including screening questions prior to the visit, additional usage of staff PPE, and extensive cleaning of exam room while observing appropriate contact time as indicated for disinfecting solutions.

## 2021-10-10 NOTE — Patient Instructions (Signed)
Diabetes: Check your blood sugar once a day   -early in AM fasting - or 2 hours after a meal   GOALS: --If you checked AM fasting : 70- 130 --If you checked 2 hours after a meal: less than 180    Check the  blood pressure   BP GOAL is between 110/65 and  135/85. If it is consistently higher or lower, let me know    the average.    GO TO THE LAB : Get the blood work     GO TO THE FRONT DESK, PLEASE SCHEDULE YOUR APPOINTMENTS Come back for a physical in 6 months

## 2021-10-11 NOTE — Assessment & Plan Note (Signed)
DM: On metformin 500 mg 3 times daily, has gained some weight, will check A1c, consider increase metformin dose.  Encouraged to keep active and go back to a healthier diet. HTN: Well-controlled, no change, on Zestoretic, check CMP High cholesterol: On Lipitor, check FLP Preventive care: Update on immunizations RTC 6 months

## 2021-10-17 ENCOUNTER — Encounter: Payer: Self-pay | Admitting: Internal Medicine

## 2021-10-17 MED ORDER — ONETOUCH DELICA PLUS LANCET30G MISC: 12 refills | Status: DC

## 2021-10-24 ENCOUNTER — Other Ambulatory Visit: Payer: Self-pay | Admitting: Internal Medicine

## 2021-10-27 ENCOUNTER — Other Ambulatory Visit (HOSPITAL_BASED_OUTPATIENT_CLINIC_OR_DEPARTMENT_OTHER): Payer: Self-pay

## 2021-12-02 ENCOUNTER — Other Ambulatory Visit (HOSPITAL_BASED_OUTPATIENT_CLINIC_OR_DEPARTMENT_OTHER): Payer: Self-pay

## 2021-12-12 ENCOUNTER — Encounter: Payer: Self-pay | Admitting: Internal Medicine

## 2021-12-12 MED ORDER — ONETOUCH VERIO VI STRP: ORAL_STRIP | 12 refills | Status: DC

## 2021-12-17 LAB — HM DIABETES EYE EXAM

## 2022-01-02 ENCOUNTER — Other Ambulatory Visit (HOSPITAL_BASED_OUTPATIENT_CLINIC_OR_DEPARTMENT_OTHER): Payer: Self-pay

## 2022-01-15 ENCOUNTER — Other Ambulatory Visit: Payer: Self-pay | Admitting: Internal Medicine

## 2022-01-29 ENCOUNTER — Other Ambulatory Visit (HOSPITAL_BASED_OUTPATIENT_CLINIC_OR_DEPARTMENT_OTHER): Payer: Self-pay

## 2022-02-25 ENCOUNTER — Other Ambulatory Visit: Payer: Self-pay | Admitting: Internal Medicine

## 2022-03-12 ENCOUNTER — Other Ambulatory Visit (HOSPITAL_BASED_OUTPATIENT_CLINIC_OR_DEPARTMENT_OTHER): Payer: Self-pay

## 2022-04-02 ENCOUNTER — Encounter: Payer: Self-pay | Admitting: Internal Medicine

## 2022-04-09 ENCOUNTER — Encounter: Payer: Self-pay | Admitting: *Deleted

## 2022-04-09 NOTE — Telephone Encounter (Signed)
Patient was in the car.  She will call back to scheduled for next week. ?

## 2022-04-10 ENCOUNTER — Other Ambulatory Visit (HOSPITAL_BASED_OUTPATIENT_CLINIC_OR_DEPARTMENT_OTHER): Payer: Self-pay

## 2022-04-10 MED ORDER — LATANOPROST 0.005 % OP SOLN
OPHTHALMIC | 11 refills | Status: DC
  Filled 2022-04-10: qty 2.5, 25d supply, fill #0
  Filled 2022-05-16: qty 2.5, 25d supply, fill #1
  Filled 2022-06-17: qty 2.5, 25d supply, fill #2
  Filled 2022-07-20: qty 2.5, 25d supply, fill #3
  Filled 2022-08-11: qty 2.5, 25d supply, fill #4
  Filled 2022-09-17: qty 2.5, 25d supply, fill #5
  Filled 2022-10-20: qty 2.5, 25d supply, fill #6
  Filled 2022-11-16: qty 2.5, 25d supply, fill #7
  Filled 2022-12-31: qty 2.5, 25d supply, fill #8
  Filled 2023-01-27: qty 2.5, 25d supply, fill #9
  Filled 2023-03-16: qty 2.5, 25d supply, fill #10

## 2022-04-15 ENCOUNTER — Encounter: Payer: Medicare Other | Admitting: Internal Medicine

## 2022-04-22 ENCOUNTER — Ambulatory Visit (INDEPENDENT_AMBULATORY_CARE_PROVIDER_SITE_OTHER): Payer: Medicare Other

## 2022-04-22 DIAGNOSIS — Z0000 Encounter for general adult medical examination without abnormal findings: Secondary | ICD-10-CM | POA: Diagnosis not present

## 2022-04-22 NOTE — Patient Instructions (Signed)
Monique Bauer , ?Thank you for taking time to come for your Medicare Wellness Visit. I appreciate your ongoing commitment to your health goals. Please review the following plan we discussed and let me know if I can assist you in the future.  ? ?Screening recommendations/referrals: ?Colonoscopy: no longer needed ?Mammogram: 08/13/21 due 08/13/22 ?Bone Density: 08/30/20 due 08/30/22 ?Recommended yearly ophthalmology/optometry visit for glaucoma screening and checkup ?Recommended yearly dental visit for hygiene and checkup ? ?Vaccinations: ?Influenza vaccine: up to date ?Pneumococcal vaccine: up to date ?Tdap vaccine: up to date  ?Shingles vaccine: up to date   ?Covid-19:completed ? ?Advanced directives: yes, on file ? ?Conditions/risks identified: see problem list  ? ?Next appointment: Follow up in one year for your annual wellness visit  ? ? ?Preventive Care 47 Years and Older, Female ?Preventive care refers to lifestyle choices and visits with your health care provider that can promote health and wellness. ?What does preventive care include? ?A yearly physical exam. This is also called an annual well check. ?Dental exams once or twice a year. ?Routine eye exams. Ask your health care provider how often you should have your eyes checked. ?Personal lifestyle choices, including: ?Daily care of your teeth and gums. ?Regular physical activity. ?Eating a healthy diet. ?Avoiding tobacco and drug use. ?Limiting alcohol use. ?Practicing safe sex. ?Taking low-dose aspirin every day. ?Taking vitamin and mineral supplements as recommended by your health care provider. ?What happens during an annual well check? ?The services and screenings done by your health care provider during your annual well check will depend on your age, overall health, lifestyle risk factors, and family history of disease. ?Counseling  ?Your health care provider may ask you questions about your: ?Alcohol use. ?Tobacco use. ?Drug use. ?Emotional well-being. ?Home  and relationship well-being. ?Sexual activity. ?Eating habits. ?History of falls. ?Memory and ability to understand (cognition). ?Work and work Statistician. ?Reproductive health. ?Screening  ?You may have the following tests or measurements: ?Height, weight, and BMI. ?Blood pressure. ?Lipid and cholesterol levels. These may be checked every 5 years, or more frequently if you are over 45 years old. ?Skin check. ?Lung cancer screening. You may have this screening every year starting at age 62 if you have a 30-pack-year history of smoking and currently smoke or have quit within the past 15 years. ?Fecal occult blood test (FOBT) of the stool. You may have this test every year starting at age 63. ?Flexible sigmoidoscopy or colonoscopy. You may have a sigmoidoscopy every 5 years or a colonoscopy every 10 years starting at age 63. ?Hepatitis C blood test. ?Hepatitis B blood test. ?Sexually transmitted disease (STD) testing. ?Diabetes screening. This is done by checking your blood sugar (glucose) after you have not eaten for a while (fasting). You may have this done every 1-3 years. ?Bone density scan. This is done to screen for osteoporosis. You may have this done starting at age 45. ?Mammogram. This may be done every 1-2 years. Talk to your health care provider about how often you should have regular mammograms. ?Talk with your health care provider about your test results, treatment options, and if necessary, the need for more tests. ?Vaccines  ?Your health care provider may recommend certain vaccines, such as: ?Influenza vaccine. This is recommended every year. ?Tetanus, diphtheria, and acellular pertussis (Tdap, Td) vaccine. You may need a Td booster every 10 years. ?Zoster vaccine. You may need this after age 67. ?Pneumococcal 13-valent conjugate (PCV13) vaccine. One dose is recommended after age 47. ?Pneumococcal polysaccharide (PPSV23)  vaccine. One dose is recommended after age 85. ?Talk to your health care provider  about which screenings and vaccines you need and how often you need them. ?This information is not intended to replace advice given to you by your health care provider. Make sure you discuss any questions you have with your health care provider. ?Document Released: 12/27/2015 Document Revised: 08/19/2016 Document Reviewed: 10/01/2015 ?Elsevier Interactive Patient Education ? 2017 [employer]. ? ?Fall Prevention in the Home ?Falls can cause injuries. They can happen to people of all ages. There are many things you can do to make your home safe and to help prevent falls. ?What can I do on the outside of my home? ?Regularly fix the edges of walkways and driveways and fix any cracks. ?Remove anything that might make you trip as you walk through a door, such as a raised step or threshold. ?Trim any bushes or trees on the path to your home. ?Use bright outdoor lighting. ?Clear any walking paths of anything that might make someone trip, such as rocks or tools. ?Regularly check to see if handrails are loose or broken. Make sure that both sides of any steps have handrails. ?Any raised decks and porches should have guardrails on the edges. ?Have any leaves, snow, or ice cleared regularly. ?Use sand or salt on walking paths during winter. ?Clean up any spills in your garage right away. This includes oil or grease spills. ?What can I do in the bathroom? ?Use night lights. ?Install grab bars by the toilet and in the tub and shower. Do not use towel bars as grab bars. ?Use non-skid mats or decals in the tub or shower. ?If you need to sit down in the shower, use a plastic, non-slip stool. ?Keep the floor dry. Clean up any water that spills on the floor as soon as it happens. ?Remove soap buildup in the tub or shower regularly. ?Attach bath mats securely with double-sided non-slip rug tape. ?Do not have throw rugs and other things on the floor that can make you trip. ?What can I do in the bedroom? ?Use night lights. ?Make sure  that you have a light by your bed that is easy to reach. ?Do not use any sheets or blankets that are too big for your bed. They should not hang down onto the floor. ?Have a firm chair that has side arms. You can use this for support while you get dressed. ?Do not have throw rugs and other things on the floor that can make you trip. ?What can I do in the kitchen? ?Clean up any spills right away. ?Avoid walking on wet floors. ?Keep items that you use a lot in easy-to-reach places. ?If you need to reach something above you, use a strong step stool that has a grab bar. ?Keep electrical cords out of the way. ?Do not use floor polish or wax that makes floors slippery. If you must use wax, use non-skid floor wax. ?Do not have throw rugs and other things on the floor that can make you trip. ?What can I do with my stairs? ?Do not leave any items on the stairs. ?Make sure that there are handrails on both sides of the stairs and use them. Fix handrails that are broken or loose. Make sure that handrails are as long as the stairways. ?Check any carpeting to make sure that it is firmly attached to the stairs. Fix any carpet that is loose or worn. ?Avoid having throw rugs at the top or bottom  of the stairs. If you do have throw rugs, attach them to the floor with carpet tape. ?Make sure that you have a light switch at the top of the stairs and the bottom of the stairs. If you do not have them, ask someone to add them for you. ?What else can I do to help prevent falls? ?Wear shoes that: ?Do not have high heels. ?Have rubber bottoms. ?Are comfortable and fit you well. ?Are closed at the toe. Do not wear sandals. ?If you use a stepladder: ?Make sure that it is fully opened. Do not climb a closed stepladder. ?Make sure that both sides of the stepladder are locked into place. ?Ask someone to hold it for you, if possible. ?Clearly mark and make sure that you can see: ?Any grab bars or handrails. ?First and last steps. ?Where the edge of  each step is. ?Use tools that help you move around (mobility aids) if they are needed. These include: ?Canes. ?Walkers. ?Scooters. ?Crutches. ?Turn on the lights when you go into a dark area. Replace any

## 2022-04-22 NOTE — Progress Notes (Addendum)
? ?Subjective:  ? Monique Bauer is a 86 y.o. female who presents for Medicare Annual (Subsequent) preventive examination. ? ?I connected with  Monique Bauer on 04/22/22 by a audio enabled telemedicine application and verified that I am speaking with the correct person using two identifiers. ? ?Patient Location: Home ? ?Provider Location: Office/Clinic ? ?I discussed the limitations of evaluation and management by telemedicine. The patient expressed understanding and agreed to proceed.  ? ?Review of Systems    ? ?Cardiac Risk Factors include: advanced age (>49mn, >>14women);hypertension;dyslipidemia ? ?   ?Objective:  ?  ?There were no vitals filed for this visit. ?There is no height or weight on file to calculate BMI. ? ? ?  04/22/2022  ?  9:42 AM 03/14/2018  ? 10:15 AM 05/17/2017  ? 12:31 PM 03/01/2017  ? 10:20 AM 01/07/2017  ?  9:21 AM 08/24/2015  ?  8:26 PM  ?Advanced Directives  ?Does Patient Have a Medical Advance Directive? Yes Yes Yes Yes Yes No  ?Type of [place] of facility DNR (pink MOST or yellow form);Living will [place] will [place] will [place] will Living will;Healthcare Power of Attorney   ?Does patient want to make changes to medical advance directive? No - Patient declined  No - Patient declined No - Patient declined    ?Copy of [place]? Yes - validated most recent copy scanned in chart (See row information) Yes Yes Yes Yes   ?Would patient like information on creating a medical advance directive?      No - patient declined information  ? ? ?Current Medications (verified) ?Outpatient Encounter Medications as of 04/22/2022  ?Medication Sig  ? atorvastatin (LIPITOR) 40 MG tablet TAKE 1 TABLET BY MOUTH AT  BEDTIME  ? Calcium Carb-Cholecalciferol (CALCIUM 500+D PO) Take 1 tablet by mouth 2 (two) times daily.  ? Cyanocobalamin (B-12) 2500 MCG TABS  Take by mouth.  ? GLUCOSAMINE-CHONDROITIN PO Take 1 tablet by mouth daily.  ? glucose blood (ONETOUCH VERIO) test strip CHECK BLOOD SUGAR ONCE  DAILY  ? Lancets (ONETOUCH DELICA PLUS [id number] MISC CHECK BLOOD SUGAR ONCE  DAILY  ? latanoprost (XALATAN) 0.005 % ophthalmic solution Place 1 drop into both eyes nightly.  ? lisinopril-hydrochlorothiazide (ZESTORETIC) 10-12.5 MG tablet TAKE 2 TABLETS BY MOUTH  DAILY  ? meloxicam (MOBIC) 7.5 MG tablet TAKE 1 TABLET BY MOUTH  DAILY AS NEEDED FOR PAIN  ? metFORMIN (GLUCOPHAGE) 1000 MG tablet Take 0.5 tablets (500 mg total) by mouth with breakfast, with lunch, and with evening meal.  ? Multiple Vitamin (MULTIVITAMIN WITH MINERALS) TABS tablet Take 1 tablet by mouth daily.  ? pantoprazole (PROTONIX) 40 MG tablet TAKE 1 TABLET BY MOUTH  DAILY  ? ?No facility-administered encounter medications on file as of 04/22/2022.  ? ? ?Allergies (verified) ?Oxycodone hcl, Sulfa antibiotics, and Sulfamethoxazole-trimethoprim  ? ?History: ?Past Medical History:  ?Diagnosis Date  ? Anxiety   ? Cervical myelopathy ([place]   ? Diabetes ([place] 12-2012  ? Family history of adverse reaction to anesthesia   ? had problems waking up pt father   ? GERD (gastroesophageal reflux disease)   ? Glaucoma   ? History of hiatal hernia   ? Hyperlipidemia   ? Hypertension   ? Osteoarthritis   ? Osteopenia   ? Spiradenoma   ? ?Past Surgical History:  ?Procedure Laterality Date  ? ANTERIOR CERVICAL DECOMP/DISCECTOMY FUSION N/A  05/17/2017  ? Procedure: C4-5/5-6 ACDF;  Surgeon: Kary Kos, MD;  Location: [facility];  Service: Neurosurgery;  Laterality: N/A;  ? CARPAL TUNNEL RELEASE Right 07/2017  ? ?Family History  ?Problem Relation Age of Onset  ? Breast cancer Other   ?     aunt   ? Coronary artery disease Mother   ?     M at age 95 and F  ? Diabetes Mother   ?     Monique Bauer, daughter, sister  ? Hypertension Mother   ? Stroke Father   ? Asthma Sister   ? Colon cancer Neg Hx   ? ?Social History  ? ?Socioeconomic History  ?  Marital status: Married  ?  Spouse name: Coralee Pesa  ? Number of children: 3  ? Years of education: Not on file  ? Highest education level: Not on file  ?Occupational History  ? Occupation: retired   ?  Employer: RETIRED  ?Tobacco Use  ? Smoking status: Former  ?  Types: Cigarettes  ?  Quit date: 10/15/1981  ?  Years since quitting: 40.5  ? Smokeless tobacco: Never  ? Tobacco comments:  ?  was a social smoker  ?Vaping Use  ? Vaping Use: Never used  ?Substance and Sexual Activity  ? Alcohol use: Yes  ?  Comment: wine sometimes  ? Drug use: No  ? Sexual activity: Not Currently  ?Other Topics Concern  ? Not on file  ?Social History Narrative  ? Lives w/ husband (married x 3);  3 daughters live in [place], 5 G-children, 1 GG son   ? ?Social Determinants of Health  ? ?Financial Resource Strain: Not on file  ?Food Insecurity: Not on file  ?Transportation Needs: Not on file  ?Physical Activity: Not on file  ?Stress: Not on file  ?Social Connections: Not on file  ? ? ?Tobacco Counseling ?Counseling given: Not Answered ?Tobacco comments: was a social smoker ? ? ?Clinical Intake: ? ?Pre-visit preparation completed: Yes ? ?Pain : No/denies pain ? ?  ? ?Nutritional Risks: None ?Diabetes: No ? ?How often do you need to have someone help you when you read instructions, pamphlets, or other written materials from your doctor or pharmacy?: 1 - Never ? ?Diabetic?No ? ?Interpreter Needed?: No ? ?Information entered by :: Champ Keetch ? ? ?Activities of Daily Living ? ?  04/22/2022  ?  9:44 AM  ?In your present state of health, do you have any difficulty performing the following activities:  ?Hearing? 0  ?Vision? 0  ?Difficulty concentrating or making decisions? 0  ?Walking or climbing stairs? 0  ?Dressing or bathing? 0  ?Doing errands, shopping? 0  ?Preparing Food and eating ? N  ?Using the Toilet? N  ?In the past six months, have you accidently leaked urine? N  ?Do you have problems with loss of bowel control? N  ?Managing your Medications? N   ?Managing your Finances? N  ?Housekeeping or managing your Housekeeping? N  ? ? ?Patient Care Team: ?Colon Branch, MD as PCP - General ?Vevelyn Royals, MD as Consulting Physician (Ophthalmology) ?Kary Kos, MD as Consulting Physician (Neurosurgery) ?Radionchenko, Isaias Cowman, MD as Referring Physician (Ophthalmology) ? ?Indicate any recent Medical Services you may have received from other than [facility] providers in the past year (date may be approximate). ? ?   ?Assessment:  ? This is a routine wellness examination for [facility] ? ?Hearing/Vision screen ?No results found. ? ?Dietary issues and exercise activities discussed: ?Current Exercise Habits: Home  exercise routine, Type of exercise: walking, Time (Minutes): 30, Frequency (Times/Week): 7, Weekly Exercise (Minutes/Week): 210, Intensity: Mild, Exercise limited by: None identified ? ? Goals Addressed   ?None ?  ? ?Depression Screen ? ?  04/22/2022  ?  9:43 AM 10/10/2021  ?  8:02 AM 10/16/2020  ?  8:56 AM 01/11/2020  ?  7:59 AM 01/04/2019  ?  9:12 AM 03/14/2018  ? 10:16 AM 02/28/2018  ? 10:49 AM  ?PHQ 2/9 Scores  ?PHQ - 2 Score 0 0 0 0 0 0 0  ?PHQ- 9 Score   0  0    ?  ?Fall Risk ? ?  04/22/2022  ?  9:43 AM 10/10/2021  ?  8:02 AM 04/10/2021  ?  8:02 AM 10/16/2020  ?  8:21 AM 07/06/2019  ?  8:01 AM  ?Fall Risk   ?Falls in the past year? 0 0 0 0 0  ?Number falls in past yr: 0 0 0 0 0  ?Injury with Fall? 0 0 0 0   ?Risk for fall due to : No Fall Risks      ?Follow up Falls evaluation completed Falls evaluation completed Falls evaluation completed Falls evaluation completed   ? ? ?FALL RISK PREVENTION PERTAINING TO THE HOME: ? ?Any stairs in or around the home? No  ?If so, are there any without handrails?  N/A ?Home free of loose throw rugs in walkways, pet beds, electrical cords, etc? Yes  ?Adequate lighting in your home to reduce risk of falls? Yes  ? ?ASSISTIVE DEVICES UTILIZED TO PREVENT FALLS: ? ?Life alert? No  ?Use of a cane, walker or w/c? No  ?Grab bars in the bathroom? Yes   ?Shower chair or bench in shower? No  ?Elevated toilet seat or a handicapped toilet? No  ? ?TIMED UP AND GO: ? ?Was the test performed? No .  ? ? ?Cognitive Function: ? ?  03/14/2018  ? 10:22 AM 01/07/2017  ?

## 2022-04-27 ENCOUNTER — Ambulatory Visit (INDEPENDENT_AMBULATORY_CARE_PROVIDER_SITE_OTHER): Payer: Medicare Other | Admitting: Internal Medicine

## 2022-04-27 ENCOUNTER — Encounter: Payer: Self-pay | Admitting: Internal Medicine

## 2022-04-27 VITALS — BP 134/68 | HR 68 | Temp 97.9°F | Resp 16 | Ht 60.0 in | Wt 138.0 lb

## 2022-04-27 DIAGNOSIS — Z0000 Encounter for general adult medical examination without abnormal findings: Secondary | ICD-10-CM

## 2022-04-27 DIAGNOSIS — Z23 Encounter for immunization: Secondary | ICD-10-CM | POA: Diagnosis not present

## 2022-04-27 DIAGNOSIS — E119 Type 2 diabetes mellitus without complications: Secondary | ICD-10-CM | POA: Diagnosis not present

## 2022-04-27 DIAGNOSIS — E785 Hyperlipidemia, unspecified: Secondary | ICD-10-CM

## 2022-04-27 DIAGNOSIS — I10 Essential (primary) hypertension: Secondary | ICD-10-CM | POA: Diagnosis not present

## 2022-04-27 LAB — CBC WITH DIFFERENTIAL/PLATELET
Basophils Absolute: 0.1 10*3/uL (ref 0.0–0.1)
Basophils Relative: 1 % (ref 0.0–3.0)
Eosinophils Absolute: 0.6 10*3/uL (ref 0.0–0.7)
Eosinophils Relative: 7.7 % — ABNORMAL HIGH (ref 0.0–5.0)
HCT: 36.2 % (ref 36.0–46.0)
Hemoglobin: 12 g/dL (ref 12.0–15.0)
Lymphocytes Relative: 29.1 % (ref 12.0–46.0)
Lymphs Abs: 2.3 10*3/uL (ref 0.7–4.0)
MCHC: 33.2 g/dL (ref 30.0–36.0)
MCV: 87.8 fl (ref 78.0–100.0)
Monocytes Absolute: 0.7 10*3/uL (ref 0.1–1.0)
Monocytes Relative: 8.2 % (ref 3.0–12.0)
Neutro Abs: 4.3 10*3/uL (ref 1.4–7.7)
Neutrophils Relative %: 54 % (ref 43.0–77.0)
Platelets: 208 10*3/uL (ref 150.0–400.0)
RBC: 4.13 Mil/uL (ref 3.87–5.11)
RDW: 14.4 % (ref 11.5–15.5)
WBC: 7.9 10*3/uL (ref 4.0–10.5)

## 2022-04-27 LAB — HEMOGLOBIN A1C: Hgb A1c MFr Bld: 6.9 % — ABNORMAL HIGH (ref 4.6–6.5)

## 2022-04-27 NOTE — Patient Instructions (Signed)
Check the  blood pressure regularly BP GOAL is between 110/65 and  135/85. If it is consistently higher or lower, let me know    GO TO THE LAB : Get the blood work     GO TO THE FRONT DESK, PLEASE SCHEDULE YOUR APPOINTMENTS Come back for   a checkup in 6 months  Fall Prevention in the Home, Adult Falls can cause injuries and affect people of all ages. There are many simple things that you can do to make your home safe and to help prevent falls. Ask for help when making these changes, if needed. What actions can I take to prevent falls? General instructions Use good lighting in all rooms. Replace any light bulbs that burn out, turn on lights if it is dark, and use night-lights. Place frequently used items in easy-to-reach places. Lower the shelves around your home if necessary. Set up furniture so that there are clear paths around it. Avoid moving your furniture around. Remove throw rugs and other tripping hazards from the floor. Avoid walking on wet floors. Fix any uneven floor surfaces. Add color or contrast paint or tape to grab bars and handrails in your home. Place contrasting color strips on the first and last steps of staircases. When you use a stepladder, make sure that it is completely opened and that the sides and supports are firmly locked. Have someone hold the ladder while you are using it. Do not climb a closed stepladder. Know where your pets are when moving through your home. What can I do in the bathroom?     Keep the floor dry. Immediately clean up any water that is on the floor. Remove soap buildup in the tub or shower regularly. Use nonskid mats or decals on the floor of the tub or shower. Attach bath mats securely with double-sided, nonslip rug tape. If you need to sit down while you are in the shower, use a plastic, nonslip stool. Install grab bars by the toilet and in the tub and shower. Do not use towel bars as grab bars. What can I do in the bedroom? Make  sure that a bedside light is easy to reach. Do not use oversized bedding that reaches the floor. Have a firm chair that has side arms to use for getting dressed. What can I do in the kitchen? Clean up any spills right away. If you need to reach for something above you, use a sturdy step stool that has a grab bar. Keep electrical cables out of the way. Do not use floor polish or wax that makes floors slippery. If you must use wax, make sure that it is non-skid floor wax. What can I do with my stairs? Do not leave any items on the stairs. Make sure that you have a light switch at the top and the bottom of the stairs. Have them installed if you do not have them. Make sure that there are handrails on both sides of the stairs. Fix handrails that are broken or loose. Make sure that handrails are as long as the staircases. Install non-slip stair treads on all stairs in your home. Avoid having throw rugs at the top or bottom of stairs, or secure the rugs with carpet tape to prevent them from moving. Choose a carpet design that does not hide the edge of steps on the stairs. Check any carpeting to make sure that it is firmly attached to the stairs. Fix any carpet that is loose or worn. What can   I do on the outside of my home? Use bright outdoor lighting. Regularly repair the edges of walkways and driveways and fix any cracks. Remove high doorway thresholds. Trim any shrubbery on the main path into your home. Regularly check that handrails are securely fastened and in good repair. Both sides of all steps should have handrails. Install guardrails along the edges of any raised decks or porches. Clear walkways of debris and clutter, including tools and rocks. Have leaves, snow, and ice cleared regularly. Use sand or salt on walkways during winter months. In the garage, clean up any spills right away, including grease or oil spills. What other actions can I take? Wear closed-toe shoes that fit well and  support your feet. Wear shoes that have rubber soles or low heels. Use mobility aids as needed, such as canes, walkers, scooters, and crutches. Review your medicines with your health care provider. Some medicines can cause dizziness or changes in blood pressure, which increase your risk of falling. Talk with your health care provider about other ways that you can decrease your risk of falls. This may include working with a physical therapist or trainer to improve your strength, balance, and endurance. Where to find more information Centers for Disease Control and Prevention, [employer]: www.cdc.gov National Institute on Aging: www.nia.nih.gov Contact a health care provider if: You are afraid of falling at home. You feel weak, drowsy, or dizzy at home. You fall at home. Summary There are many simple things that you can do to make your home safe and to help prevent falls. Ways to make your home safe include removing tripping hazards and installing grab bars in the bathroom. Ask for help when making these changes in your home. This information is not intended to replace advice given to you by your health care provider. Make sure you discuss any questions you have with your health care provider. Document Revised: 09/01/2021 Document Reviewed: 07/03/2020 Elsevier Patient Education  2023 [employer] Inc.  

## 2022-04-27 NOTE — Progress Notes (Signed)
? ?Subjective:  ? ? Patient ID: Monique Bauer, female    DOB: [birth year], 86 y.o.   MRN: [id number] ? ?DOS:  04/27/2022 ?Type of visit - description: CPX ? ?Here for CPX ?In general feeling well. ?Taking meloxicam daily, with food, denies nausea-vomiting-blood in the stools-abdominal pain ? ?Review of Systems ? ?Other than above, a 14 point review of systems is negative  ? ?  ? ? ?Past Medical History:  ?Diagnosis Date  ? Anxiety   ? Cervical myelopathy ([place])   ? Diabetes ([place]) 12-2012  ? Family history of adverse reaction to anesthesia   ? had problems waking up pt father   ? GERD (gastroesophageal reflux disease)   ? Glaucoma   ? History of hiatal hernia   ? Hyperlipidemia   ? Hypertension   ? Osteoarthritis   ? Osteopenia   ? Spiradenoma   ? ? ?Past Surgical History:  ?Procedure Laterality Date  ? ANTERIOR CERVICAL DECOMP/DISCECTOMY FUSION N/A 05/17/2017  ? Procedure: C4-5/5-6 ACDF;  Surgeon: Kary Kos, MD;  Location: [facility];  Service: Neurosurgery;  Laterality: N/A;  ? CARPAL TUNNEL RELEASE Right 07/2017  ? ?Social History  ? ?Socioeconomic History  ? Marital status: Married  ?  Spouse name: Coralee Pesa  ? Number of children: 3  ? Years of education: Not on file  ? Highest education level: Not on file  ?Occupational History  ? Occupation: retired   ?  Employer: RETIRED  ?Tobacco Use  ? Smoking status: Former  ?  Types: Cigarettes  ?  Quit date: 10/15/1981  ?  Years since quitting: 40.5  ? Smokeless tobacco: Never  ? Tobacco comments:  ?  was a social smoker  ?Vaping Use  ? Vaping Use: Never used  ?Substance and Sexual Activity  ? Alcohol use: Yes  ?  Comment: wine sometimes  ? Drug use: No  ? Sexual activity: Not Currently  ?Other Topics Concern  ? Not on file  ?Social History Narrative  ? Lives w/ husband (married x 3);  3 daughters live in [place], 5 G-children, 2 GG son   ? ?Social Determinants of Health  ? ?Financial Resource Strain: Low Risk   ? Difficulty of Paying Living Expenses: Not hard at all  ?Food Insecurity: No  Food Insecurity  ? Worried About Charity fundraiser in the Last Year: Never true  ? Ran Out of Food in the Last Year: Never true  ?Transportation Needs: No Transportation Needs  ? Lack of Transportation (Medical): No  ? Lack of Transportation (Non-Medical): No  ?Physical Activity: Sufficiently Active  ? Days of Exercise per Week: 7 days  ? Minutes of Exercise per Session: 30 min  ?Stress: No Stress Concern Present  ? Feeling of Stress : Not at all  ?Social Connections: Socially Integrated  ? Frequency of Communication with Friends and Family: More than three times a week  ? Frequency of Social Gatherings with Friends and Family: More than three times a week  ? Attends Religious Services: More than 4 times per year  ? Active Member of Clubs or Organizations: Yes  ? Attends Archivist Meetings: More than 4 times per year  ? Marital Status: Married  ?Intimate Partner Violence: Not At Risk  ? Fear of Current or Ex-Partner: No  ? Emotionally Abused: No  ? Physically Abused: No  ? Sexually Abused: No  ? ? ?Current Outpatient Medications  ?Medication Instructions  ? atorvastatin (LIPITOR) 40 MG tablet TAKE 1 TABLET  BY MOUTH AT  BEDTIME  ? Calcium Carb-Cholecalciferol (CALCIUM 500+D PO) 1 tablet, Oral, 2 times daily  ? Cyanocobalamin (B-12) 2500 MCG TABS Oral  ? GLUCOSAMINE-CHONDROITIN PO 1 tablet, Oral, Daily  ? glucose blood (ONETOUCH VERIO) test strip CHECK BLOOD SUGAR ONCE  DAILY  ? Lancets (ONETOUCH DELICA PLUS [id number]) MISC CHECK BLOOD SUGAR ONCE  DAILY  ? latanoprost (XALATAN) 0.005 % ophthalmic solution Place 1 drop into both eyes nightly.  ? lisinopril-hydrochlorothiazide (ZESTORETIC) 10-12.5 MG tablet 2 tablets, Oral, Daily  ? meloxicam (MOBIC) 7.5 MG tablet TAKE 1 TABLET BY MOUTH  DAILY AS NEEDED FOR PAIN  ? metFORMIN (GLUCOPHAGE) 500 mg, Oral, 3 times daily with meals  ? Multiple Vitamin (MULTIVITAMIN WITH MINERALS) TABS tablet 1 tablet, Oral, Daily  ? pantoprazole (PROTONIX) 40 MG tablet TAKE 1  TABLET BY MOUTH  DAILY  ? ? ?   ?Objective:  ? Physical Exam ?BP 134/68 (BP Location: Left Arm, Patient Position: Sitting, Cuff Size: Small)   Pulse 68   Temp 97.9 ?F (36.6 ?C) (Oral)   Resp 16   Ht 5' (1.524 m)   Wt 138 lb (62.6 kg)   SpO2 97%   BMI 26.95 kg/m?  ?General: ?Well developed, NAD, BMI noted ?Neck: No  thyromegaly  ?HEENT:  ?Normocephalic . Face symmetric, atraumatic ?Lungs:  ?CTA B ?Normal respiratory effort, no intercostal retractions, no accessory muscle use. ?Heart: RRR,  no murmur.  ?Abdomen:  ?Not distended, soft, non-tender. No rebound or rigidity.   ?DM foot exam: No edema, good pedal pulses, pinprick examination normal ?Skin: Exposed areas without rash. Not pale. Not jaundice ?Neurologic:  ?alert & oriented X3.  ?Speech normal, gait appropriate for age and unassisted ?Strength symmetric and appropriate for age.  ?Psych: ?Cognition and judgment appear intact.  ?Cooperative with normal attention span and concentration.  ?Behavior appropriate. ?No anxious or depressed appearing. ? ?   ?Assessment   ? ?Assessment ?Diabetes 2014 ?HTN ?Hyperlipidemia ?Anxiety ?DJD ?GERD ?Osteopenia; dexa 07-2016, 2021 wnl.   ?Ao [place] (-)  AAA 2010 ?Cervical myelopathy, s/p surgery 05-2017 ?CTS s/p R surgery 2018 ?Glaucoma, early macular degeneration ? ? ?PLAN: ?Here for CPX ?DM: On metformin, check A1c ?HTN: On Zestoretic, BP today is great, recommend ambulatory BPs.  Check labs. ?Hyperlipidemia: On atorvastatin, last FLP satisfactory, recheck: RTC ?DJD: On daily meloxicam, takes with food, no GI symptoms.  Continue present care. ?RTC 6 months ? ? ? ?

## 2022-04-28 ENCOUNTER — Encounter: Payer: Self-pay | Admitting: Internal Medicine

## 2022-04-28 LAB — COMPREHENSIVE METABOLIC PANEL
ALT: 29 U/L (ref 0–35)
AST: 26 U/L (ref 0–37)
Albumin: 4.2 g/dL (ref 3.5–5.2)
Alkaline Phosphatase: 90 U/L (ref 39–117)
BUN: 30 mg/dL — ABNORMAL HIGH (ref 6–23)
CO2: 26 mEq/L (ref 19–32)
Calcium: 9.6 mg/dL (ref 8.4–10.5)
Chloride: 104 mEq/L (ref 96–112)
Creatinine, Ser: 1.35 mg/dL — ABNORMAL HIGH (ref 0.40–1.20)
GFR: 36.34 mL/min — ABNORMAL LOW (ref >60.00)
Glucose, Bld: 100 mg/dL — ABNORMAL HIGH (ref 70–99)
Potassium: 4 mEq/L (ref 3.5–5.1)
Sodium: 141 mEq/L (ref 135–145)
Total Bilirubin: 0.4 mg/dL (ref 0.2–1.2)
Total Protein: 7.1 g/dL (ref 6.0–8.3)

## 2022-04-28 NOTE — Assessment & Plan Note (Signed)
Here for CPX ?DM: On metformin, check A1c ?HTN: On Zestoretic, BP today is great, recommend ambulatory BPs.  Check labs. ?Hyperlipidemia: On atorvastatin, last FLP satisfactory, recheck: RTC ?DJD: On daily meloxicam, takes with food, no GI symptoms.  Continue present care. ?RTC 6 months ? ?

## 2022-04-28 NOTE — Assessment & Plan Note (Signed)
-   Tdap 2020 ?- Pneumonia 2015; prevnar 2016; PNM 20: Today ?- s/p  Zostavax ; s/p shingrix x 2  ?-COVID VAX : utd ?Female care  ?Cervical cancer screening no longer indicated. ?Breast cancer screening: MMG 07/2021 [facility].  We agreed that it would be reasonable to stop in the next 2 to 3 years. ?CCS  pt elected no further screening ?--Labs:   CMP CBC A1c ?- ACP documents on file ?- Patient education:  fall prevention, diet, physical activity ? ?  ?

## 2022-05-16 ENCOUNTER — Other Ambulatory Visit (HOSPITAL_BASED_OUTPATIENT_CLINIC_OR_DEPARTMENT_OTHER): Payer: Self-pay

## 2022-05-18 ENCOUNTER — Other Ambulatory Visit (HOSPITAL_BASED_OUTPATIENT_CLINIC_OR_DEPARTMENT_OTHER): Payer: Self-pay

## 2022-05-20 LAB — HM DIABETES EYE EXAM

## 2022-05-22 ENCOUNTER — Other Ambulatory Visit: Payer: Self-pay | Admitting: Internal Medicine

## 2022-05-26 ENCOUNTER — Other Ambulatory Visit: Payer: Self-pay | Admitting: Internal Medicine

## 2022-06-17 ENCOUNTER — Other Ambulatory Visit (HOSPITAL_BASED_OUTPATIENT_CLINIC_OR_DEPARTMENT_OTHER): Payer: Self-pay

## 2022-07-20 ENCOUNTER — Other Ambulatory Visit (HOSPITAL_BASED_OUTPATIENT_CLINIC_OR_DEPARTMENT_OTHER): Payer: Self-pay

## 2022-08-11 ENCOUNTER — Other Ambulatory Visit: Payer: Self-pay | Admitting: Internal Medicine

## 2022-08-12 ENCOUNTER — Other Ambulatory Visit (HOSPITAL_BASED_OUTPATIENT_CLINIC_OR_DEPARTMENT_OTHER): Payer: Self-pay

## 2022-08-19 ENCOUNTER — Encounter: Payer: Self-pay | Admitting: Internal Medicine

## 2022-08-19 LAB — HM MAMMOGRAPHY

## 2022-09-07 ENCOUNTER — Other Ambulatory Visit (HOSPITAL_BASED_OUTPATIENT_CLINIC_OR_DEPARTMENT_OTHER): Payer: Self-pay

## 2022-09-07 MED ORDER — FLUAD QUADRIVALENT 0.5 ML IM PRSY
PREFILLED_SYRINGE | INTRAMUSCULAR | 0 refills | Status: DC
  Filled 2022-09-07: qty 0.5, 1d supply, fill #0

## 2022-09-17 ENCOUNTER — Other Ambulatory Visit (HOSPITAL_BASED_OUTPATIENT_CLINIC_OR_DEPARTMENT_OTHER): Payer: Self-pay

## 2022-09-28 ENCOUNTER — Other Ambulatory Visit (HOSPITAL_BASED_OUTPATIENT_CLINIC_OR_DEPARTMENT_OTHER): Payer: Self-pay

## 2022-09-28 MED ORDER — COMIRNATY 30 MCG/0.3ML IM SUSY
PREFILLED_SYRINGE | INTRAMUSCULAR | 0 refills | Status: DC
  Filled 2022-09-28: qty 0.3, 1d supply, fill #0

## 2022-10-21 ENCOUNTER — Other Ambulatory Visit (HOSPITAL_BASED_OUTPATIENT_CLINIC_OR_DEPARTMENT_OTHER): Payer: Self-pay

## 2022-10-27 ENCOUNTER — Other Ambulatory Visit: Payer: Self-pay

## 2022-10-28 ENCOUNTER — Encounter: Payer: Self-pay | Admitting: Internal Medicine

## 2022-10-28 ENCOUNTER — Ambulatory Visit (INDEPENDENT_AMBULATORY_CARE_PROVIDER_SITE_OTHER): Payer: Medicare Other | Admitting: Internal Medicine

## 2022-10-28 VITALS — BP 124/80 | HR 76 | Temp 97.5°F | Resp 16 | Ht 60.0 in | Wt 128.4 lb

## 2022-10-28 DIAGNOSIS — I10 Essential (primary) hypertension: Secondary | ICD-10-CM | POA: Diagnosis not present

## 2022-10-28 DIAGNOSIS — E119 Type 2 diabetes mellitus without complications: Secondary | ICD-10-CM | POA: Diagnosis not present

## 2022-10-28 DIAGNOSIS — E785 Hyperlipidemia, unspecified: Secondary | ICD-10-CM | POA: Diagnosis not present

## 2022-10-28 LAB — BASIC METABOLIC PANEL
BUN: 30 mg/dL — ABNORMAL HIGH (ref 6–23)
CO2: 31 mEq/L (ref 19–32)
Calcium: 9.5 mg/dL (ref 8.4–10.5)
Chloride: 102 mEq/L (ref 96–112)
Creatinine, Ser: 1.22 mg/dL — ABNORMAL HIGH (ref 0.40–1.20)
GFR: 40.89 mL/min — ABNORMAL LOW (ref >60.00)
Glucose, Bld: 107 mg/dL — ABNORMAL HIGH (ref 70–99)
Potassium: 4.2 mEq/L (ref 3.5–5.1)
Sodium: 140 mEq/L (ref 135–145)

## 2022-10-28 LAB — LIPID PANEL
Cholesterol: 151 mg/dL (ref 0–200)
HDL: 46.1 mg/dL (ref >39.00)
LDL Cholesterol: 73 mg/dL (ref 0–99)
NonHDL: 104.97
Total CHOL/HDL Ratio: 3
Triglycerides: 159 mg/dL — ABNORMAL HIGH (ref 0.0–149.0)
VLDL: 31.8 mg/dL (ref 0.0–40.0)

## 2022-10-28 LAB — HEMOGLOBIN A1C: Hgb A1c MFr Bld: 6.8 % — ABNORMAL HIGH (ref 4.6–6.5)

## 2022-10-28 NOTE — Patient Instructions (Addendum)
It was good to see you today.  Check the  blood pressure regularly BP GOAL is between 110/65 and  135/85. If it is consistently higher or lower, let me know    GO TO THE LAB : Get the blood work     [place], [place] back for   a physical exam by May 2024

## 2022-10-28 NOTE — Progress Notes (Signed)
Subjective:    Patient ID: Monique Bauer, female    DOB: 1936/08/09, 86 y.o.   MRN: [id number]  DOS:  10/28/2022 Type of visit - description: F/U  Routine follow-up. Good compliance with medication. She stopped meloxicam due to a slightly decreased kidney function.   Review of Systems See above   Past Medical History:  Diagnosis Date   Anxiety    Cervical myelopathy ([place])    Diabetes ([place]) 12-2012   Family history of adverse reaction to anesthesia    had problems waking up pt father    GERD (gastroesophageal reflux disease)    Glaucoma    History of hiatal hernia    Hyperlipidemia    Hypertension    Osteoarthritis    Osteopenia    Spiradenoma     Past Surgical History:  Procedure Laterality Date   ANTERIOR CERVICAL DECOMP/DISCECTOMY FUSION N/A 05/17/2017   Procedure: C4-5/5-6 ACDF;  Surgeon: Kary Kos, MD;  Location: [facility];  Service: Neurosurgery;  Laterality: N/A;   CARPAL TUNNEL RELEASE Right 07/2017    Current Outpatient Medications  Medication Instructions   atorvastatin (LIPITOR) 40 mg, Oral, Daily at bedtime   Calcium Carb-Cholecalciferol (CALCIUM 500+D PO) 1 tablet, Oral, 2 times daily   Cyanocobalamin (B-12) 2500 MCG TABS Oral   escitalopram (LEXAPRO) 10 MG tablet TAKE 1 TABLET BY MOUTH  DAILY   GLUCOSAMINE-CHONDROITIN PO 1 tablet, Oral, Daily   glucose blood (ONETOUCH VERIO) test strip CHECK BLOOD SUGAR ONCE  DAILY   Lancets (ONETOUCH DELICA PLUS [id number]) MISC CHECK BLOOD SUGAR ONCE  DAILY   latanoprost (XALATAN) 0.005 % ophthalmic solution Place 1 drop into both eyes nightly.   lisinopril-hydrochlorothiazide (ZESTORETIC) 10-12.5 MG tablet 2 tablets, Oral, Daily   metFORMIN (GLUCOPHAGE) 1000 MG tablet TAKE ONE-HALF TABLET BY MOUTH  WITH BREAKFAST , WITH LUNCH AND  WITH EVENING MEAL   Multiple Vitamin (MULTIVITAMIN WITH MINERALS) TABS tablet 1 tablet, Oral, Daily   pantoprazole (PROTONIX) 40 MG tablet TAKE 1 TABLET BY MOUTH DAILY       Objective:    Physical Exam BP 124/80   Pulse 76   Temp (!) 97.5 F (36.4 C) (Oral)   Resp 16   Ht 5' (1.524 m)   Wt 128 lb 6 oz (58.2 kg)   SpO2 97%   BMI 25.07 kg/m  General:   Well developed, NAD, BMI noted. HEENT:  Normocephalic . Face symmetric, atraumatic Lungs:  CTA B Normal respiratory effort, no intercostal retractions, no accessory muscle use. Heart: RRR,  no murmur.  Lower extremities: no pretibial edema bilaterally  Skin: Not pale. Not jaundice Neurologic:  alert & oriented X3.  Speech normal, gait appropriate for age and unassisted Psych--  Cognition and judgment appear intact.  Cooperative with normal attention span and concentration.  Behavior appropriate. No anxious or depressed appearing.      Assessment    Assessment Diabetes 2014 HTN Hyperlipidemia Anxiety DJD GERD Osteopenia; dexa 07-2016, 2021 wnl.   Ao [place] (-)  AAA 2010 Cervical myelopathy, s/p surgery 05-2017 CTS s/p R surgery 2018 Glaucoma, early macular degeneration   PLAN: DM: On metformin, good compliance without apparent side effects.  Check A1c HTN: BP looks very good today, recommend to check amb BPs regularly, on Zestoretic, check BMP. Increased creatinine: Since the last visit patient completely stopped NSAIDs, aches and pains managed with Tylenol successfully. Hyperlipidemia: On atorvastatin, check FLP. Anxiety: Well-controlled on Lexapro, her 3 daughters have medical issues, fortunately Alzena is doing well emotionally  and supporting all her children. Praised. Had a flu and COVID-vaccine, plans to get an RSV RTC 04/2019 for CPX

## 2022-10-28 NOTE — Assessment & Plan Note (Signed)
DM: On metformin, good compliance without apparent side effects.  Check A1c HTN: BP looks very good today, recommend to check amb BPs regularly, on Zestoretic, check BMP. Increased creatinine: Since the last visit patient completely stopped NSAIDs, aches and pains managed with Tylenol successfully. Hyperlipidemia: On atorvastatin, check FLP. Anxiety: Well-controlled on Lexapro, her 3 daughters have medical issues, fortunately Natania is doing well emotionally and supporting all her children. Praised. Had a flu and COVID-vaccine, plans to get an RSV RTC 04/2019 for CPX

## 2022-11-17 ENCOUNTER — Other Ambulatory Visit (HOSPITAL_BASED_OUTPATIENT_CLINIC_OR_DEPARTMENT_OTHER): Payer: Self-pay

## 2022-11-18 ENCOUNTER — Other Ambulatory Visit (HOSPITAL_BASED_OUTPATIENT_CLINIC_OR_DEPARTMENT_OTHER): Payer: Self-pay

## 2022-11-18 MED ORDER — AREXVY 120 MCG/0.5ML IM SUSR
INTRAMUSCULAR | 0 refills | Status: DC
  Filled 2022-11-18: qty 0.5, 1d supply, fill #0

## 2022-11-25 ENCOUNTER — Other Ambulatory Visit (HOSPITAL_BASED_OUTPATIENT_CLINIC_OR_DEPARTMENT_OTHER): Payer: Self-pay

## 2022-11-25 MED ORDER — LATANOPROST 0.005 % OP SOLN
1.0000 [drp] | Freq: Every evening | OPHTHALMIC | 11 refills | Status: DC
  Filled 2022-11-25: qty 2.5, 25d supply, fill #0

## 2022-12-04 ENCOUNTER — Other Ambulatory Visit: Payer: Self-pay | Admitting: Internal Medicine

## 2022-12-09 ENCOUNTER — Other Ambulatory Visit (HOSPITAL_BASED_OUTPATIENT_CLINIC_OR_DEPARTMENT_OTHER): Payer: Self-pay

## 2022-12-09 ENCOUNTER — Encounter: Payer: Self-pay | Admitting: Internal Medicine

## 2022-12-09 LAB — HM DIABETES EYE EXAM

## 2022-12-09 MED ORDER — METFORMIN HCL 1000 MG PO TABS
1500.0000 mg | ORAL_TABLET | Freq: Three times a day (TID) | ORAL | 0 refills | Status: DC
  Filled 2022-12-09: qty 45, 10d supply, fill #0

## 2022-12-31 ENCOUNTER — Other Ambulatory Visit (HOSPITAL_BASED_OUTPATIENT_CLINIC_OR_DEPARTMENT_OTHER): Payer: Self-pay

## 2023-03-17 ENCOUNTER — Other Ambulatory Visit: Payer: Self-pay

## 2023-03-17 ENCOUNTER — Other Ambulatory Visit (HOSPITAL_BASED_OUTPATIENT_CLINIC_OR_DEPARTMENT_OTHER): Payer: Self-pay

## 2023-03-19 ENCOUNTER — Other Ambulatory Visit: Payer: Self-pay

## 2023-04-07 ENCOUNTER — Telehealth: Payer: Self-pay | Admitting: Internal Medicine

## 2023-04-07 NOTE — Telephone Encounter (Signed)
Contacted Monique Bauer to schedule their annual wellness visit. Appointment made for 04/26/2023.  Monique Bauer; Care Guide Ambulatory Clinical Support  l [facility] Direct Dial: [phone]

## 2023-04-12 ENCOUNTER — Other Ambulatory Visit (HOSPITAL_BASED_OUTPATIENT_CLINIC_OR_DEPARTMENT_OTHER): Payer: Self-pay

## 2023-04-12 MED ORDER — LATANOPROST 0.005 % OP SOLN
1.0000 [drp] | Freq: Every day | OPHTHALMIC | 11 refills | Status: DC
  Filled 2023-04-12: qty 2.5, 50d supply, fill #0
  Filled 2023-05-21: qty 2.5, 50d supply, fill #1
  Filled 2023-06-28: qty 2.5, 50d supply, fill #2

## 2023-05-03 ENCOUNTER — Encounter: Payer: Medicare Other | Admitting: Internal Medicine

## 2023-05-25 ENCOUNTER — Encounter: Payer: Self-pay | Admitting: Internal Medicine

## 2023-05-26 ENCOUNTER — Ambulatory Visit (INDEPENDENT_AMBULATORY_CARE_PROVIDER_SITE_OTHER): Payer: Medicare Other | Admitting: *Deleted

## 2023-05-26 DIAGNOSIS — Z0000 Encounter for general adult medical examination without abnormal findings: Secondary | ICD-10-CM | POA: Diagnosis not present

## 2023-05-26 NOTE — Progress Notes (Signed)
Subjective:  Pt completed ADLs, Fall risk, and SDOH during e-check in on 05/25/23.  Answers verified with pt.    Monique Bauer is a 87 y.o. female who presents for Medicare Annual (Subsequent) preventive examination.  I connected with  Monique Bauer on 05/26/23 by a audio enabled telemedicine application and verified that I am speaking with the correct person using two identifiers.  Patient Location: Home  Provider Location: Office/Clinic  I discussed the limitations of evaluation and management by telemedicine. The patient expressed understanding and agreed to proceed.   Review of Systems     Cardiac Risk Factors include: advanced age (>37men, >79 women);diabetes mellitus;dyslipidemia;hypertension     Objective:    Today's Vitals   There is no height or weight on file to calculate BMI.     05/26/2023    8:21 AM 04/22/2022    9:42 AM 03/14/2018   10:15 AM 05/17/2017   12:31 PM 03/01/2017   10:20 AM 01/07/2017    9:21 AM 08/24/2015    8:26 PM  Advanced Directives  Does Patient Have a Medical Advance Directive? Yes Yes Yes Yes Yes Yes No  Type of Estate agent of [place];Living will Healthcare Power of [place];Out of facility DNR (pink MOST or yellow form);Living will Healthcare Power of [place];Living will Healthcare Power of [employer];Living will Healthcare Power of [place];Living will Living will;Healthcare Power of Attorney   Does patient want to make changes to medical advance directive? No - Patient declined No - Patient declined  No - Patient declined No - Patient declined    Copy of Healthcare Power of Attorney in Chart? Yes - validated most recent copy scanned in chart (See row information) Yes - validated most recent copy scanned in chart (See row information) Yes Yes Yes Yes   Would patient like information on creating a medical advance directive?       No - patient declined information    Current Medications (verified) Outpatient Encounter  Medications as of 05/26/2023  Medication Sig   atorvastatin (LIPITOR) 40 MG tablet TAKE 1 TABLET BY MOUTH AT  BEDTIME   Calcium Carb-Cholecalciferol (CALCIUM 500+D PO) Take 1 tablet by mouth 2 (two) times daily.   Cyanocobalamin (B-12) 2500 MCG TABS Take by mouth.   escitalopram (LEXAPRO) 10 MG tablet TAKE 1 TABLET BY MOUTH  DAILY   GLUCOSAMINE-CHONDROITIN PO Take 1 tablet by mouth daily.   glucose blood (ONETOUCH VERIO) test strip CHECK BLOOD SUGAR DAILY   Lancets (ONETOUCH DELICA PLUS LANCET30G) MISC CHECK BLOOD SUGAR ONCE  DAILY   latanoprost (XALATAN) 0.005 % ophthalmic solution Place 1 drop into both eyes at bedtime.   lisinopril-hydrochlorothiazide (ZESTORETIC) 10-12.5 MG tablet TAKE 2 TABLETS BY MOUTH DAILY   metFORMIN (GLUCOPHAGE) 1000 MG tablet Take 1.5 tablets (1,500 mg total) by mouth with breakfast, with lunch, and with evening meal.   Multiple Vitamin (MULTIVITAMIN WITH MINERALS) TABS tablet Take 1 tablet by mouth daily.   pantoprazole (PROTONIX) 40 MG tablet TAKE 1 TABLET BY MOUTH DAILY   [DISCONTINUED] latanoprost (XALATAN) 0.005 % ophthalmic solution Place 1 drop into both eyes at bedtime.   [DISCONTINUED] RSV vaccine recomb adjuvanted (AREXVY) 120 MCG/0.5ML injection Inject into the muscle.   No facility-administered encounter medications on file as of 05/26/2023.    Allergies (verified) Oxycodone hcl, Sulfa antibiotics, and Sulfamethoxazole-trimethoprim   History: Past Medical History:  Diagnosis Date   Allergy 1960   Anxiety    Cervical myelopathy (HCC)    Diabetes (HCC)  12/2012   Family history of adverse reaction to anesthesia    had problems waking up pt father    GERD (gastroesophageal reflux disease)    Glaucoma    History of hiatal hernia    Hyperlipidemia    Hypertension    Osteoarthritis    Osteopenia    Spiradenoma    Past Surgical History:  Procedure Laterality Date   ANTERIOR CERVICAL DECOMP/DISCECTOMY FUSION N/A 05/17/2017   Procedure: C4-5/5-6  ACDF;  Surgeon: Donalee Citrin, MD;  Location: [facility] OR;  Service: Neurosurgery;  Laterality: N/A;   CARPAL TUNNEL RELEASE Right 07/2017   SPINE SURGERY  05/17/2017   Family History  Problem Relation Age of Onset   Breast cancer Other        aunt    Coronary artery disease Mother        M at age 46 and F   Diabetes Mother        M, F, daughter, sister   Hypertension Mother    Arthritis Mother    Early death Mother    Varicose Veins Mother    Stroke Father    Diabetes Father    Asthma Sister    Diabetes Sister    Cancer Daughter    Diabetes Daughter    Hypertension Daughter    Cancer Daughter    Hypertension Daughter    Colon cancer Neg Hx    Social History   Socioeconomic History   Marital status: Married    Spouse name: Ronny   Number of children: 3   Years of education: Not on file   Highest education level: Not on file  Occupational History   Occupation: retired     Associate Professor: RETIRED  Tobacco Use   Smoking status: Former    Packs/day: 0.25    Years: 30.00    Additional pack years: 0.00    Total pack years: 7.50    Types: Cigarettes    Quit date: 10/15/1981    Years since quitting: 41.6   Smokeless tobacco: Never   Tobacco comments:    was a social smoker  Vaping Use   Vaping Use: Never used  Substance and Sexual Activity   Alcohol use: Yes    Alcohol/week: 1.0 standard drink of alcohol    Types: 1 Glasses of wine per week    Comment: Socially and only occasionally   Drug use: No   Sexual activity: Not Currently  Other Topics Concern   Not on file  Social History Narrative   Lives w/ husband (married x 3);  3 daughters live in [place], 5 G-children, 2 GG son    Social Determinants of Health   Financial Resource Strain: Low Risk  (05/25/2023)   Overall Financial Resource Strain (CARDIA)    Difficulty of Paying Living Expenses: Not hard at all  Food Insecurity: No Food Insecurity (05/25/2023)   Hunger Vital Sign    Worried About Running Out of Food in the Last  Year: Never true    Ran Out of Food in the Last Year: Never true  Transportation Needs: No Transportation Needs (05/25/2023)   PRAPARE - Administrator, Civil Service (Medical): No    Lack of Transportation (Non-Medical): No  Physical Activity: Insufficiently Active (05/25/2023)   Exercise Vital Sign    Days of Exercise per Week: 3 days    Minutes of Exercise per Session: 30 min  Stress: No Stress Concern Present (05/25/2023)   [employer] of Occupational Health -  Occupational Stress Questionnaire    Feeling of Stress : Not at all  Social Connections: Unknown (05/25/2023)   Social Connection and Isolation Panel [NHANES]    Frequency of Communication with Friends and Family: More than three times a week    Frequency of Social Gatherings with Friends and Family: Twice a week    Attends Religious Services: Not on Marketing executive or Organizations: Yes    Attends Engineer, structural: More than 4 times per year    Marital Status: Married    Tobacco Counseling Counseling given: Not Answered Tobacco comments: was a social smoker   Clinical Intake:  Pre-visit preparation completed: Yes  Pain : No/denies pain  Nutritional Risks: None Diabetes: Yes CBG done?: No Did pt. bring in CBG monitor from home?: No  How often do you need to have someone help you when you read instructions, pamphlets, or other written materials from your doctor or pharmacy?: 1 - Never   Activities of Daily Living    05/25/2023   11:01 AM 04/22/2023    6:21 PM  In your present state of health, do you have any difficulty performing the following activities:  Hearing? 0 0  Vision? 0 0  Difficulty concentrating or making decisions? 0 0  Walking or climbing stairs? 0 0  Dressing or bathing? 0 0  Doing errands, shopping? 0 0  Preparing Food and eating ? N N  Using the Toilet? N N  In the past six months, have you accidently leaked urine? N N  Do you have problems with  loss of bowel control? N N  Managing your Medications? N N  Managing your Finances? N N  Housekeeping or managing your Housekeeping? N N    Patient Care Team: Wanda Plump, MD as PCP - Hall Busing, MD as Consulting Physician (Ophthalmology) Donalee Citrin, MD as Consulting Physician (Neurosurgery) Arnetha Gula Lucious Groves, MD as Referring Physician (Ophthalmology)  Indicate any recent Medical Services you may have received from other than [facility] providers in the past year (date may be approximate).     Assessment:   This is a routine wellness examination for [facility].  Hearing/Vision screen No results found.  Dietary issues and exercise activities discussed: Current Exercise Habits: Home exercise routine, Type of exercise: walking, Time (Minutes): 30, Frequency (Times/Week): 3, Weekly Exercise (Minutes/Week): 90, Intensity: Moderate, Exercise limited by: None identified   Goals Addressed   None    Depression Screen    05/26/2023    8:26 AM 10/28/2022    8:01 AM 04/22/2022    9:43 AM 10/10/2021    8:02 AM 10/16/2020    8:56 AM 01/11/2020    7:59 AM 01/04/2019    9:12 AM  PHQ 2/9 Scores  PHQ - 2 Score 0 0 0 0 0 0 0  PHQ- 9 Score     0  0    Fall Risk    05/25/2023   11:01 AM 04/22/2023    6:21 PM 10/28/2022    8:01 AM 04/22/2022    9:43 AM 10/10/2021    8:02 AM  Fall Risk   Falls in the past year? 1 1 0 0 0  Comment misstepped on the escalator      Number falls in past yr: 0 0 0 0 0  Injury with Fall? 1 1 0 0 0  Risk for fall due to : History of fall(s)   No Fall Risks   Follow up  Falls evaluation completed  Falls evaluation completed Falls evaluation completed Falls evaluation completed    FALL RISK PREVENTION PERTAINING TO THE HOME:  Any stairs in or around the home? No  Home free of loose throw rugs in walkways, pet beds, electrical cords, etc? Yes  Adequate lighting in your home to reduce risk of falls? Yes   ASSISTIVE DEVICES UTILIZED TO PREVENT  FALLS:  Life alert? No  Use of a cane, walker or w/c? No  Grab bars in the bathroom? Yes  Shower chair or bench in shower? Yes  Elevated toilet seat or a handicapped toilet? No   TIMED UP AND GO:  Was the test performed?  No, audio visit .    Cognitive Function:    03/14/2018   10:22 AM 01/07/2017    9:25 AM  MMSE - Mini Mental State Exam  Orientation to time 5 5  Orientation to Place 5 5  Registration 3 3  Attention/ Calculation 5 5  Recall 3 3  Language- name 2 objects 2 2  Language- repeat 1 1  Language- follow 3 step command 3 3  Language- read & follow direction 1 1  Write a sentence 1 1  Copy design 1 1  Total score 30 30        05/26/2023    8:32 AM 04/22/2022    9:45 AM  6CIT Screen  What Year? 0 points 0 points  What month? 0 points 0 points  What time? 0 points 0 points  Count back from 20 0 points 0 points  Months in reverse 0 points 0 points  Repeat phrase 0 points 0 points  Total Score 0 points 0 points    Immunizations Immunization History  Administered Date(s) Administered   COVID-19, mRNA, vaccine(Comirnaty)12 years and older 09/28/2022   Fluad Quad(high Dose 65+) 09/07/2022   Influenza Split 09/18/2011, 09/14/2012, 08/21/2021   Influenza Whole 09/28/2007, 10/03/2008, 09/11/2009, 09/16/2010   Influenza, High Dose Seasonal PF 09/11/2015, 09/03/2016, 09/13/2018   Influenza,inj,Quad PF,6+ Mos 09/06/2013, 09/05/2014, 07/26/2019   Influenza-Unspecified 08/04/2017, 09/13/2018, 09/13/2020   PFIZER Comirnaty(Gray Top)Covid-19 Tri-Sucrose Vaccine 03/20/2021   PFIZER(Purple Top)SARS-COV-2 Vaccination 12/27/2019, 01/16/2020, 09/09/2020   PNEUMOCOCCAL CONJUGATE-20 04/27/2022   Pfizer Covid-19 Vaccine Bivalent Booster 50yrs & up 09/04/2021   Pneumococcal Conjugate-13 04/19/2015   Pneumococcal Polysaccharide-23 12/14/2005, 01/11/2014   Respiratory Syncytial Virus Vaccine,Recomb Aduvanted(Arexvy) 11/18/2022   Td 12/14/1998, 11/11/2009   Tdap 01/04/2019    Zoster Recombinat (Shingrix) 10/01/2017, 12/16/2017   Zoster, Live 01/25/2008    TDAP status: Up to date  Flu Vaccine status: Up to date  Pneumococcal vaccine status: Up to date  Covid-19 vaccine status: Information provided on how to obtain vaccines.   Qualifies for Shingles Vaccine? Yes   Zostavax completed Yes   Shingrix Completed?: Yes  Screening Tests Health Maintenance  Topic Date Due   Medicare Annual Wellness (AWV)  04/23/2023   FOOT EXAM  04/28/2023   HEMOGLOBIN A1C  04/28/2023   Diabetic kidney evaluation - Urine ACR  10/28/2027 (Originally 10/13/1954)   INFLUENZA VACCINE  07/15/2023   Diabetic kidney evaluation - eGFR measurement  10/29/2023   OPHTHALMOLOGY EXAM  12/10/2023   DTaP/Tdap/Td (4 - Td or Tdap) 01/04/2029   Pneumonia Vaccine 40+ Years old  Completed   DEXA SCAN  Completed   Zoster Vaccines- Shingrix  Completed   HPV VACCINES  Aged Out   COVID-19 Vaccine  Discontinued    Health Maintenance  Health Maintenance Due  Topic Date Due  Medicare Annual Wellness (AWV)  04/23/2023   FOOT EXAM  04/28/2023   HEMOGLOBIN A1C  04/28/2023    Colorectal cancer screening: No longer required.   Mammogram status: Completed 08/19/22. Repeat every year  Bone Density status: Completed 08/30/20. Results reflect: Bone density results: NORMAL. Repeat every 2 years.  Lung Cancer Screening: (Low Dose CT Chest recommended if Age 38-80 years, 30 pack-year currently smoking OR have quit w/in 15years.) does not qualify.   Additional Screening:  Hepatitis C Screening: does not qualify  Vision Screening: Recommended annual ophthalmology exams for early detection of glaucoma and other disorders of the eye. Is the patient up to date with their annual eye exam?  Yes  Who is the provider or what is the name of the office in which the patient attends annual eye exams? Dr. Atlee Abide If pt is not established with a provider, would they like to be referred to a provider to  establish care? No .   Dental Screening: Recommended annual dental exams for proper oral hygiene  Community Resource Referral / Chronic Care Management: CRR required this visit?  No   CCM required this visit?  No      Plan:     I have personally reviewed and noted the following in the patient's chart:   Medical and social history Use of alcohol, tobacco or illicit drugs  Current medications and supplements including opioid prescriptions. Patient is not currently taking opioid prescriptions. Functional ability and status Nutritional status Physical activity Advanced directives List of other physicians Hospitalizations, surgeries, and ER visits in previous 12 months Vitals Screenings to include cognitive, depression, and falls Referrals and appointments  In addition, I have reviewed and discussed with patient certain preventive protocols, quality metrics, and best practice recommendations. A written personalized care plan for preventive services as well as general preventive health recommendations were provided to patient.   Due to this being a telephonic visit, the after visit summary with patients personalized plan was offered to patient via mail or my-chart.  Patient would like to access on my-chart.   Donne Anon, [place]   05/26/2023   Nurse Notes: None

## 2023-05-26 NOTE — Patient Instructions (Signed)
Monique Bauer , Thank you for taking time to come for your Medicare Wellness Visit. I appreciate your ongoing commitment to your health goals. Please review the following plan we discussed and let me know if I can assist you in the future.      This is a list of the screening recommended for you and due dates:  Health Maintenance  Topic Date Due   Complete foot exam   04/28/2023   Hemoglobin A1C  04/28/2023   Yearly kidney health urinalysis for diabetes  10/28/2027*   Flu Shot  07/15/2023   Yearly kidney function blood test for diabetes  10/29/2023   Eye exam for diabetics  12/10/2023   Medicare Annual Wellness Visit  05/25/2024   DTaP/Tdap/Td vaccine (4 - Td or Tdap) 01/04/2029   Pneumonia Vaccine  Completed   DEXA scan (bone density measurement)  Completed   Zoster (Shingles) Vaccine  Completed   HPV Vaccine  Aged Out   COVID-19 Vaccine  Discontinued  *Topic was postponed. The date shown is not the original due date.    Next appointment: Follow up in one year for your annual wellness visit.   Preventive Care 85 Years and Older, Female Preventive care refers to lifestyle choices and visits with your health care provider that can promote health and wellness. What does preventive care include? A yearly physical exam. This is also called an annual well check. Dental exams once or twice a year. Routine eye exams. Ask your health care provider how often you should have your eyes checked. Personal lifestyle choices, including: Daily care of your teeth and gums. Regular physical activity. Eating a healthy diet. Avoiding tobacco and drug use. Limiting alcohol use. Practicing safe sex. Taking low-dose aspirin every day. Taking vitamin and mineral supplements as recommended by your health care provider. What happens during an annual well check? The services and screenings done by your health care provider during your annual well check will depend on your age, overall health, lifestyle  risk factors, and family history of disease. Counseling  Your health care provider may ask you questions about your: Alcohol use. Tobacco use. Drug use. Emotional well-being. Home and relationship well-being. Sexual activity. Eating habits. History of falls. Memory and ability to understand (cognition). Work and work Astronomer. Reproductive health. Screening  You may have the following tests or measurements: Height, weight, and BMI. Blood pressure. Lipid and cholesterol levels. These may be checked every 5 years, or more frequently if you are over 37 years old. Skin check. Lung cancer screening. You may have this screening every year starting at age 13 if you have a 30-pack-year history of smoking and currently smoke or have quit within the past 15 years. Fecal occult blood test (FOBT) of the stool. You may have this test every year starting at age 20. Flexible sigmoidoscopy or colonoscopy. You may have a sigmoidoscopy every 5 years or a colonoscopy every 10 years starting at age 82. Hepatitis C blood test. Hepatitis B blood test. Sexually transmitted disease (STD) testing. Diabetes screening. This is done by checking your blood sugar (glucose) after you have not eaten for a while (fasting). You may have this done every 1-3 years. Bone density scan. This is done to screen for osteoporosis. You may have this done starting at age 19. Mammogram. This may be done every 1-2 years. Talk to your health care provider about how often you should have regular mammograms. Talk with your health care provider about your test results, treatment options,  and if necessary, the need for more tests. Vaccines  Your health care provider may recommend certain vaccines, such as: Influenza vaccine. This is recommended every year. Tetanus, diphtheria, and acellular pertussis (Tdap, Td) vaccine. You may need a Td booster every 10 years. Zoster vaccine. You may need this after age 46. Pneumococcal  13-valent conjugate (PCV13) vaccine. One dose is recommended after age 60. Pneumococcal polysaccharide (PPSV23) vaccine. One dose is recommended after age 26. Talk to your health care provider about which screenings and vaccines you need and how often you need them. This information is not intended to replace advice given to you by your health care provider. Make sure you discuss any questions you have with your health care provider. Document Released: 12/27/2015 Document Revised: 08/19/2016 Document Reviewed: 10/01/2015 Elsevier Interactive Patient Education  2017 [place] Prevention in the Home Falls can cause injuries. They can happen to people of all ages. There are many things you can do to make your home safe and to help prevent falls. What can I do on the outside of my home? Regularly fix the edges of walkways and driveways and fix any cracks. Remove anything that might make you trip as you walk through a door, such as a raised step or threshold. Trim any bushes or trees on the path to your home. Use bright outdoor lighting. Clear any walking paths of anything that might make someone trip, such as rocks or tools. Regularly check to see if handrails are loose or broken. Make sure that both sides of any steps have handrails. Any raised decks and porches should have guardrails on the edges. Have any leaves, snow, or ice cleared regularly. Use sand or salt on walking paths during winter. Clean up any spills in your garage right away. This includes oil or grease spills. What can I do in the bathroom? Use night lights. Install grab bars by the toilet and in the tub and shower. Do not use towel bars as grab bars. Use non-skid mats or decals in the tub or shower. If you need to sit down in the shower, use a plastic, non-slip stool. Keep the floor dry. Clean up any water that spills on the floor as soon as it happens. Remove soap buildup in the tub or shower regularly. Attach  bath mats securely with double-sided non-slip rug tape. Do not have throw rugs and other things on the floor that can make you trip. What can I do in the bedroom? Use night lights. Make sure that you have a light by your bed that is easy to reach. Do not use any sheets or blankets that are too big for your bed. They should not hang down onto the floor. Have a firm chair that has side arms. You can use this for support while you get dressed. Do not have throw rugs and other things on the floor that can make you trip. What can I do in the kitchen? Clean up any spills right away. Avoid walking on wet floors. Keep items that you use a lot in easy-to-reach places. If you need to reach something above you, use a strong step stool that has a grab bar. Keep electrical cords out of the way. Do not use floor polish or wax that makes floors slippery. If you must use wax, use non-skid floor wax. Do not have throw rugs and other things on the floor that can make you trip. What can I do with my stairs? Do not leave  any items on the stairs. Make sure that there are handrails on both sides of the stairs and use them. Fix handrails that are broken or loose. Make sure that handrails are as long as the stairways. Check any carpeting to make sure that it is firmly attached to the stairs. Fix any carpet that is loose or worn. Avoid having throw rugs at the top or bottom of the stairs. If you do have throw rugs, attach them to the floor with carpet tape. Make sure that you have a light switch at the top of the stairs and the bottom of the stairs. If you do not have them, ask someone to add them for you. What else can I do to help prevent falls? Wear shoes that: Do not have high heels. Have rubber bottoms. Are comfortable and fit you well. Are closed at the toe. Do not wear sandals. If you use a stepladder: Make sure that it is fully opened. Do not climb a closed stepladder. Make sure that both sides of the  stepladder are locked into place. Ask someone to hold it for you, if possible. Clearly mark and make sure that you can see: Any grab bars or handrails. First and last steps. Where the edge of each step is. Use tools that help you move around (mobility aids) if they are needed. These include: Canes. Walkers. Scooters. Crutches. Turn on the lights when you go into a dark area. Replace any light bulbs as soon as they burn out. Set up your furniture so you have a clear path. Avoid moving your furniture around. If any of your floors are uneven, fix them. If there are any pets around you, be aware of where they are. Review your medicines with your doctor. Some medicines can make you feel dizzy. This can increase your chance of falling. Ask your doctor what other things that you can do to help prevent falls. This information is not intended to replace advice given to you by your health care provider. Make sure you discuss any questions you have with your health care provider. Document Released: 09/26/2009 Document Revised: 05/07/2016 Document Reviewed: 01/04/2015 Elsevier Interactive Patient Education  2017 [employer].

## 2023-07-14 ENCOUNTER — Encounter (INDEPENDENT_AMBULATORY_CARE_PROVIDER_SITE_OTHER): Payer: Self-pay

## 2023-07-30 ENCOUNTER — Other Ambulatory Visit (HOSPITAL_BASED_OUTPATIENT_CLINIC_OR_DEPARTMENT_OTHER): Payer: Self-pay

## 2023-07-30 MED ORDER — DORZOLAMIDE HCL-TIMOLOL MAL 2-0.5 % OP SOLN
1.0000 [drp] | Freq: Two times a day (BID) | OPHTHALMIC | 11 refills | Status: DC
  Filled 2023-07-30: qty 10, 100d supply, fill #0
  Filled 2023-10-28: qty 10, 50d supply, fill #1
  Filled 2023-12-03: qty 10, 50d supply, fill #2
  Filled 2024-02-10: qty 10, 50d supply, fill #3
  Filled 2024-04-11: qty 10, 50d supply, fill #4
  Filled 2024-06-12: qty 10, 50d supply, fill #5

## 2023-08-04 ENCOUNTER — Ambulatory Visit (INDEPENDENT_AMBULATORY_CARE_PROVIDER_SITE_OTHER): Payer: Medicare Other | Admitting: Internal Medicine

## 2023-08-04 ENCOUNTER — Encounter: Payer: Self-pay | Admitting: Internal Medicine

## 2023-08-04 VITALS — BP 120/68 | HR 64 | Temp 98.2°F | Resp 16 | Ht 60.0 in | Wt 133.1 lb

## 2023-08-04 DIAGNOSIS — I10 Essential (primary) hypertension: Secondary | ICD-10-CM | POA: Diagnosis not present

## 2023-08-04 DIAGNOSIS — M2550 Pain in unspecified joint: Secondary | ICD-10-CM | POA: Diagnosis not present

## 2023-08-04 DIAGNOSIS — Z0000 Encounter for general adult medical examination without abnormal findings: Secondary | ICD-10-CM

## 2023-08-04 DIAGNOSIS — E785 Hyperlipidemia, unspecified: Secondary | ICD-10-CM

## 2023-08-04 DIAGNOSIS — E119 Type 2 diabetes mellitus without complications: Secondary | ICD-10-CM

## 2023-08-04 DIAGNOSIS — Z0001 Encounter for general adult medical examination with abnormal findings: Secondary | ICD-10-CM

## 2023-08-04 DIAGNOSIS — M2560 Stiffness of unspecified joint, not elsewhere classified: Secondary | ICD-10-CM

## 2023-08-04 DIAGNOSIS — R269 Unspecified abnormalities of gait and mobility: Secondary | ICD-10-CM

## 2023-08-04 DIAGNOSIS — Z7984 Long term (current) use of oral hypoglycemic drugs: Secondary | ICD-10-CM

## 2023-08-04 LAB — LIPID PANEL
Cholesterol: 154 mg/dL (ref 0–200)
HDL: 40.7 mg/dL (ref >39.00)
LDL Cholesterol: 73 mg/dL (ref 0–99)
NonHDL: 112.87
Total CHOL/HDL Ratio: 4
Triglycerides: 198 mg/dL — ABNORMAL HIGH (ref 0.0–149.0)
VLDL: 39.6 mg/dL (ref 0.0–40.0)

## 2023-08-04 LAB — COMPREHENSIVE METABOLIC PANEL
ALT: 24 U/L (ref 0–35)
AST: 23 U/L (ref 0–37)
Albumin: 4 g/dL (ref 3.5–5.2)
Alkaline Phosphatase: 92 U/L (ref 39–117)
BUN: 23 mg/dL (ref 6–23)
CO2: 28 meq/L (ref 19–32)
Calcium: 9.9 mg/dL (ref 8.4–10.5)
Chloride: 103 meq/L (ref 96–112)
Creatinine, Ser: 1.2 mg/dL (ref 0.40–1.20)
GFR: 41.48 mL/min — ABNORMAL LOW (ref >60.00)
Glucose, Bld: 115 mg/dL — ABNORMAL HIGH (ref 70–99)
Potassium: 4.1 mEq/L (ref 3.5–5.1)
Sodium: 141 mEq/L (ref 135–145)
Total Bilirubin: 0.5 mg/dL (ref 0.2–1.2)
Total Protein: 7.4 g/dL (ref 6.0–8.3)

## 2023-08-04 LAB — CBC WITH DIFFERENTIAL/PLATELET
Basophils Absolute: 0 10*3/uL (ref 0.0–0.1)
Basophils Relative: 0.5 % (ref 0.0–3.0)
Eosinophils Absolute: 0.5 10*3/uL (ref 0.0–0.7)
Eosinophils Relative: 6.2 % — ABNORMAL HIGH (ref 0.0–5.0)
HCT: 38.8 % (ref 36.0–46.0)
Hemoglobin: 12.5 g/dL (ref 12.0–15.0)
Lymphocytes Relative: 29.7 % (ref 12.0–46.0)
Lymphs Abs: 2.4 10*3/uL (ref 0.7–4.0)
MCHC: 32.3 g/dL (ref 30.0–36.0)
MCV: 86.5 fl (ref 78.0–100.0)
Monocytes Absolute: 0.6 10*3/uL (ref 0.1–1.0)
Monocytes Relative: 7.8 % (ref 3.0–12.0)
Neutro Abs: 4.5 10*3/uL (ref 1.4–7.7)
Neutrophils Relative %: 55.8 % (ref 43.0–77.0)
Platelets: 288 10*3/uL (ref 150.0–400.0)
RBC: 4.48 Mil/uL (ref 3.87–5.11)
RDW: 14.3 % (ref 11.5–15.5)
WBC: 8.1 10*3/uL (ref 4.0–10.5)

## 2023-08-04 LAB — HEMOGLOBIN A1C: Hgb A1c MFr Bld: 6.9 % — ABNORMAL HIGH (ref 4.6–6.5)

## 2023-08-04 LAB — SEDIMENTATION RATE: Sed Rate: 57 mm/h — ABNORMAL HIGH (ref 0–30)

## 2023-08-04 LAB — C-REACTIVE PROTEIN: CRP: <1 mg/dL (ref 0.5–20.0)

## 2023-08-04 NOTE — Assessment & Plan Note (Signed)
Here for CPX  DM: On metformin, checking labs. Hyperlipidemia: On Lipitor, long-term, checking labs HTN: Continue Zestoretic.  Labs Anxiety: See HPI,  self d/c  Lexapro, still have some family issues but less stress than before.  She actually is having some mild depressions mostly due to aches and pains. I mention Cymbalta as a option to hel her mood and pain, will reassess the issue on RTC DJD, arthralgias: Is the  main issue for the patient at this point;  gets stiff really easily, pain in multiple sites. Plan: Sed rate, CRP, CBC, TSH, ANA, RF. Further advised with results. In the meantime continue Tylenol, referred to physical therapy; rec to consider  yoga and tai chi   On atorvastatin long-term, consider holding it for few weeks.    Increased creatinine: Felt to be due to meloxicam, rechecking today. RTC 3 months.  Sooner if needed

## 2023-08-04 NOTE — Progress Notes (Signed)
Subjective:    Patient ID: Monique Bauer, female    DOB: [birth year], 87 y.o.   MRN: [id number]  DOS:  08/04/2023 Type of visit - description: cpx  Here for CPX.  Multiple other issues addressed.  Patient stopped Lexapro, felt she did not needed it anymore, 2 of her daughters have cancer and they are doing better. Still, remains somewhat depressed due to other family issues and mostly due to chronic pain.  See next  States she is achy all the time and get very stiff with any degree of inactivity. Arthralgias are "all over" including neck, shoulders, knees, hips. Had 1 fall few months ago.  Denies fever or chills.  No headache.  No rash. Denies any jaw  claudication.   Review of Systems  Other than above, a 14 point review of systems is negative     Past Medical History:  Diagnosis Date   Allergy 1960   Anxiety    Cervical myelopathy (HCC)    Diabetes (HCC) 12/2012   Family history of adverse reaction to anesthesia    had problems waking up pt father    GERD (gastroesophageal reflux disease)    Glaucoma    History of hiatal hernia    Hyperlipidemia    Hypertension    Osteoarthritis    Osteopenia    Spiradenoma     Past Surgical History:  Procedure Laterality Date   ANTERIOR CERVICAL DECOMP/DISCECTOMY FUSION N/A 05/17/2017   Procedure: C4-5/5-6 ACDF;  Surgeon: Donalee Citrin, MD;  Location: [facility] OR;  Service: Neurosurgery;  Laterality: N/A;   CARPAL TUNNEL RELEASE Right 07/2017   SPINE SURGERY  05/17/2017   Social History   Socioeconomic History   Marital status: Married    Spouse name: Ronny   Number of children: 3   Years of education: Not on file   Highest education level: Not on file  Occupational History   Occupation: retired     Associate Professor: RETIRED  Tobacco Use   Smoking status: Former    Current packs/day: 0.00    Average packs/day: 0.3 packs/day for 30.0 years (7.5 ttl pk-yrs)    Types: Cigarettes    Start date: 10/16/1951    Quit date: 10/15/1981     Years since quitting: 41.8   Smokeless tobacco: Never   Tobacco comments:    was a social smoker  Vaping Use   Vaping status: Never Used  Substance and Sexual Activity   Alcohol use: Yes    Alcohol/week: 1.0 standard drink of alcohol    Types: 1 Glasses of wine per week    Comment: Socially and only occasionally   Drug use: No   Sexual activity: Not Currently  Other Topics Concern   Not on file  Social History Narrative   Lives w/ husband (married x 3);  3 daughters live in [place], 5 G-children, 2 GG son    Social Determinants of Health   Financial Resource Strain: Low Risk  (05/25/2023)   Overall Financial Resource Strain (CARDIA)    Difficulty of Paying Living Expenses: Not hard at all  Food Insecurity: No Food Insecurity (05/25/2023)   Hunger Vital Sign    Worried About Running Out of Food in the Last Year: Never true    Ran Out of Food in the Last Year: Never true  Transportation Needs: No Transportation Needs (05/25/2023)   PRAPARE - Administrator, Civil Service (Medical): No    Lack of Transportation (Non-Medical): No  Physical Activity:  Insufficiently Active (05/25/2023)   Exercise Vital Sign    Days of Exercise per Week: 3 days    Minutes of Exercise per Session: 30 min  Stress: No Stress Concern Present (05/25/2023)   [employer] of Occupational Health - Occupational Stress Questionnaire    Feeling of Stress : Not at all  Social Connections: Unknown (05/25/2023)   Social Connection and Isolation Panel [NHANES]    Frequency of Communication with Friends and Family: More than three times a week    Frequency of Social Gatherings with Friends and Family: Twice a week    Attends Religious Services: Not on Marketing executive or Organizations: Yes    Attends Banker Meetings: More than 4 times per year    Marital Status: Married  Catering manager Violence: Not At Risk (04/22/2022)   Humiliation, Afraid, Rape, and Kick questionnaire     Fear of Current or Ex-Partner: No    Emotionally Abused: No    Physically Abused: No    Sexually Abused: No    Current Outpatient Medications  Medication Instructions   atorvastatin (LIPITOR) 40 mg, Oral, Daily at bedtime   Calcium Carb-Cholecalciferol (CALCIUM 500+D PO) 1 tablet, Oral, 2 times daily   Cyanocobalamin (B-12) 2500 MCG TABS Oral   dorzolamide-timolol (COSOPT) 2-0.5 % ophthalmic solution 1 drop, Both Eyes, 2 times daily   GLUCOSAMINE-CHONDROITIN PO 1 tablet, Oral, Daily   glucose blood (ONETOUCH VERIO) test strip CHECK BLOOD SUGAR DAILY   Lancets (ONETOUCH DELICA PLUS LANCET30G) MISC CHECK BLOOD SUGAR ONCE  DAILY   lisinopril-hydrochlorothiazide (ZESTORETIC) 10-12.5 MG tablet 2 tablets, Oral, Daily   metFORMIN (GLUCOPHAGE) 1,500 mg, Oral, 3 times daily with meals   Multiple Vitamin (MULTIVITAMIN WITH MINERALS) TABS tablet 1 tablet, Oral, Daily   pantoprazole (PROTONIX) 40 MG tablet TAKE 1 TABLET BY MOUTH DAILY       Objective:   Physical Exam BP 120/68   Pulse 64   Temp 98.2 F (36.8 C) (Oral)   Resp 16   Ht 5' (1.524 m)   Wt 133 lb 2 oz (60.4 kg)   SpO2 98%   BMI 26.00 kg/m  General: Well developed, NAD, BMI noted Neck: No  thyromegaly  HEENT:  Normocephalic . Face symmetric, atraumatic. Temples: No TTP Carotid pulses: Normal Lungs:  CTA B Normal respiratory effort, no intercostal retractions, no accessory muscle use. Heart: RRR,  no murmur.  Abdomen:  Not distended, soft, non-tender. No rebound or rigidity.   Lower extremities: no pretibial edema bilaterally Hands: DJD changes without synovitis Skin: Exposed areas without rash. Not pale. Not jaundice Neurologic:  alert & oriented X3.  Speech normal, gait appropriate for age and unassisted Strength symmetric and appropriate for age.  Psych: Cognition and judgment appear intact.  Cooperative with normal attention span and concentration.  Behavior appropriate. No anxious or depressed  appearing.     Assessment   Assessment Diabetes 2014 HTN Hyperlipidemia Anxiety DJD GERD Osteopenia; dexa 07-2016, 2021 wnl.   Ao [place] (-)  AAA 2010 Cervical myelopathy, s/p surgery 05-2017 CTS s/p R surgery 2018 Glaucoma, early macular degeneration   PLAN: Here for CPX - Tdap 2020 - Pneumonia 2015; prevnar 2016; PNM 20: 2023 - s/p  Zostavax ; s/p shingrix x 2  -Vaccines I recommend:   Flu shot this fall, new COVID booster in few weeks, RSV. Female care  Cervical cancer screening no longer indicated. Breast cancer screening: MMG 08-2022 [facility].  Okay to stop MMGs  if so desired.   CCS  pt elected no further screening --Labs:   See orders - ACP documents on file DM: On metformin, checking labs. Hyperlipidemia: On Lipitor, long-term, checking labs HTN: Continue Zestoretic.  Labs Anxiety: See HPI,  self d/c  Lexapro, still have some family issues but less stress than before.  She actually is having some mild depressions mostly due to aches and pains. I mention Cymbalta as a option to hel her mood and pain, will reassess the issue on RTC DJD, arthralgias: Is the  main issue for the patient at this point;  gets stiff really easily, pain in multiple sites. Plan: Sed rate, CRP, CBC, TSH, ANA, RF. Further advised with results. In the meantime continue Tylenol, referred to physical therapy; rec to consider  yoga and tai chi   On atorvastatin long-term, consider holding it for few weeks.    Increased creatinine: Felt to be due to meloxicam, rechecking today. RTC 3 months.  Sooner if needed

## 2023-08-04 NOTE — Assessment & Plan Note (Signed)
Here for CPX - Tdap 2020 - Pneumonia 2015; prevnar 2016; PNM 20: 2023 - s/p  Zostavax ; s/p shingrix x 2  -Vaccines I recommend:   Flu shot this fall, new COVID booster in few weeks, RSV. Female care  Cervical cancer screening no longer indicated. Breast cancer screening: MMG 08-2022 [facility].  Okay to stop MMGs if so desired.   CCS  pt elected no further screening --Labs:   See orders - ACP documents on file

## 2023-08-04 NOTE — Patient Instructions (Addendum)
Vaccines I recommend: Flu shot this fall New COVID booster in few weeks RSV  We are referring you to physical therapy   Check the  blood pressure regularly Blood pressure goal:  between 110/65 and  135/85. If it is consistently higher or lower, let me know      GO TO THE LAB : Get the blood work     GO TO THE FRONT DESK, PLEASE SCHEDULE YOUR APPOINTMENTS Come back for a checkup in 3 months  Come back sooner if: Pains get worse, you have fever or chills, you have severe headache

## 2023-08-05 LAB — RHEUMATOID FACTOR: Rheumatoid fact SerPl-aCnc: <10 [IU]/mL (ref <14)

## 2023-08-05 LAB — ANA: Anti Nuclear Antibody (ANA): NEGATIVE

## 2023-08-06 LAB — TSH: TSH: 1.87 u[IU]/mL (ref 0.35–5.50)

## 2023-08-23 ENCOUNTER — Telehealth: Payer: Self-pay

## 2023-08-23 NOTE — Telephone Encounter (Signed)
Plan of care signed and faxed back to [facility] at [phone]. Form sent for scanning.  

## 2023-08-25 LAB — HM MAMMOGRAPHY

## 2023-08-27 ENCOUNTER — Encounter: Payer: Self-pay | Admitting: Internal Medicine

## 2023-09-01 ENCOUNTER — Encounter: Payer: Self-pay | Admitting: Internal Medicine

## 2023-09-02 ENCOUNTER — Other Ambulatory Visit: Payer: Self-pay

## 2023-09-02 ENCOUNTER — Encounter: Payer: Self-pay | Admitting: Internal Medicine

## 2023-09-02 NOTE — Telephone Encounter (Signed)
Spoke w/ Pt- informed that form has been completed and placed at front desk for pick up at her convenience. Copy sent for scanning.

## 2023-09-02 NOTE — Telephone Encounter (Signed)
Please provide a permanent parking permit

## 2023-09-03 ENCOUNTER — Telehealth: Payer: Self-pay | Admitting: Hematology and Oncology

## 2023-09-03 LAB — SURGICAL PATHOLOGY

## 2023-09-03 NOTE — Telephone Encounter (Signed)
Spoke to patient to confirm upcoming afternoon [facility] clinic appointment on 9/25, paperwork will be sent via e-mail.   Gave location and time, also informed patient that the surgeon's office would be calling as well to get information from them similar to the packet that they will be receiving so make sure to do both.  Reminded patient that all providers will be coming to the clinic to see them HERE and if they had any questions to not hesitate to reach back out to myself or their navigators.

## 2023-09-07 ENCOUNTER — Encounter: Payer: Self-pay | Admitting: *Deleted

## 2023-09-07 DIAGNOSIS — Z170 Estrogen receptor positive status [ER+]: Secondary | ICD-10-CM | POA: Insufficient documentation

## 2023-09-08 ENCOUNTER — Encounter: Payer: Self-pay | Admitting: Genetic Counselor

## 2023-09-08 ENCOUNTER — Encounter: Payer: Self-pay | Admitting: *Deleted

## 2023-09-08 ENCOUNTER — Ambulatory Visit
Admission: RE | Admit: 2023-09-08 | Discharge: 2023-09-08 | Disposition: A | Discharge status: Home / Self Care | Payer: Medicare Other | Source: Ambulatory Visit | Attending: Radiation Oncology | Admitting: Radiation Oncology

## 2023-09-08 ENCOUNTER — Encounter: Payer: Self-pay | Admitting: General Practice

## 2023-09-08 ENCOUNTER — Inpatient Hospital Stay: Payer: Medicare Other | Attending: Hematology and Oncology

## 2023-09-08 ENCOUNTER — Inpatient Hospital Stay: Payer: Medicare Other | Admitting: Genetic Counselor

## 2023-09-08 ENCOUNTER — Ambulatory Visit: Payer: Self-pay | Admitting: General Surgery

## 2023-09-08 ENCOUNTER — Ambulatory Visit: Payer: Medicare Other | Admitting: Physical Therapy

## 2023-09-08 ENCOUNTER — Inpatient Hospital Stay: Payer: Medicare Other | Admitting: Hematology and Oncology

## 2023-09-08 VITALS — BP 180/86 | HR 80 | Temp 97.7°F | Resp 18 | Ht 60.0 in | Wt 136.4 lb

## 2023-09-08 DIAGNOSIS — Z170 Estrogen receptor positive status [ER+]: Secondary | ICD-10-CM

## 2023-09-08 DIAGNOSIS — Z87891 Personal history of nicotine dependence: Secondary | ICD-10-CM | POA: Diagnosis not present

## 2023-09-08 DIAGNOSIS — C50212 Malignant neoplasm of upper-inner quadrant of left female breast: Secondary | ICD-10-CM | POA: Insufficient documentation

## 2023-09-08 DIAGNOSIS — Z8051 Family history of malignant neoplasm of kidney: Secondary | ICD-10-CM

## 2023-09-08 DIAGNOSIS — Z803 Family history of malignant neoplasm of breast: Secondary | ICD-10-CM

## 2023-09-08 LAB — CBC WITH DIFFERENTIAL (CANCER CENTER ONLY)
Abs Immature Granulocytes: 0.02 10*3/uL (ref 0.00–0.07)
Basophils Absolute: 0.1 10*3/uL (ref 0.0–0.1)
Basophils Relative: 1 %
Eosinophils Absolute: 0.5 10*3/uL (ref 0.0–0.5)
Eosinophils Relative: 5 %
HCT: 39.1 % (ref 36.0–46.0)
Hemoglobin: 12.8 g/dL (ref 12.0–15.0)
Immature Granulocytes: 0 %
Lymphocytes Relative: 30 %
Lymphs Abs: 2.7 10*3/uL (ref 0.7–4.0)
MCH: 28.3 pg (ref 26.0–34.0)
MCHC: 32.7 g/dL (ref 30.0–36.0)
MCV: 86.5 fL (ref 80.0–100.0)
Monocytes Absolute: 0.8 10*3/uL (ref 0.1–1.0)
Monocytes Relative: 8 %
Neutro Abs: 4.9 10*3/uL (ref 1.7–7.7)
Neutrophils Relative %: 56 %
Platelet Count: 299 10*3/uL (ref 150–400)
RBC: 4.52 MIL/uL (ref 3.87–5.11)
RDW: 14.2 % (ref 11.5–15.5)
WBC Count: 8.9 10*3/uL (ref 4.0–10.5)
nRBC: 0 % (ref 0.0–0.2)

## 2023-09-08 LAB — GENETIC SCREENING ORDER

## 2023-09-08 LAB — CMP (CANCER CENTER ONLY)
ALT: 23 U/L (ref 0–44)
AST: 23 U/L (ref 15–41)
Albumin: 4.2 g/dL (ref 3.5–5.0)
Alkaline Phosphatase: 107 U/L (ref 38–126)
Anion gap: 10 (ref 5–15)
BUN: 23 mg/dL (ref 8–23)
CO2: 26 mmol/L (ref 22–32)
Calcium: 9.9 mg/dL (ref 8.9–10.3)
Chloride: 106 mmol/L (ref 98–111)
Creatinine: 1.35 mg/dL — ABNORMAL HIGH (ref 0.44–1.00)
GFR, Estimated: 39 mL/min — ABNORMAL LOW (ref >60)
Glucose, Bld: 126 mg/dL — ABNORMAL HIGH (ref 70–99)
Potassium: 3.8 mmol/L (ref 3.5–5.1)
Sodium: 142 mmol/L (ref 135–145)
Total Bilirubin: 0.4 mg/dL (ref 0.3–1.2)
Total Protein: 7.9 g/dL (ref 6.5–8.1)

## 2023-09-08 MED ORDER — KETOROLAC TROMETHAMINE 15 MG/ML IJ SOLN: 15.0000 mg | INTRAMUSCULAR | Status: AC

## 2023-09-08 NOTE — Progress Notes (Signed)
[facility] Multidisciplinary Clinic Spiritual Care Note  Met with [name] in Breast Multidisciplinary Clinic to introduce Support Center team/resources.  she completed SDOH screening; results follow below.  SDOH Interventions    Flowsheet Row Clinical Support from 05/26/2023 in [facility] at [facility] Clinical Support from 04/22/2022 in [facility] Primary Care at [facility]  SDOH Interventions    Food Insecurity Interventions Intervention Not Indicated Intervention Not Indicated  Housing Interventions Intervention Not Indicated Intervention Not Indicated  Transportation Interventions Intervention Not Indicated Intervention Not Indicated  Utilities Interventions Intervention Not Indicated --  Alcohol Usage Interventions Intervention Not Indicated (Score <7) --  Financial Strain Interventions Intervention Not Indicated Intervention Not Indicated  Physical Activity Interventions Intervention Not Indicated Intervention Not Indicated  Stress Interventions Intervention Not Indicated Intervention Not Indicated  Social Connections Interventions Intervention Not Indicated Intervention Not Indicated       SDOH Screenings   Food Insecurity: No Food Insecurity (09/08/2023)  Housing: Low Risk  (09/08/2023)  Transportation Needs: No Transportation Needs (09/08/2023)  Utilities: Not At Risk (09/08/2023)  Alcohol Screen: Low Risk  (05/25/2023)  Depression (PHQ2-9): Low Risk  (08/04/2023)  Financial Resource Strain: Low Risk  (05/25/2023)  Physical Activity: Insufficiently Active (05/25/2023)  Social Connections: Unknown (05/25/2023)  Stress: No Stress Concern Present (05/25/2023)  Tobacco Use: Medium Risk (08/04/2023)    Chaplain and patient discussed common feelings and emotions when being diagnosed with cancer, and the importance of support during treatment.  Chaplain informed patient of the support team and support services at [facility].  Chaplain provided contact  information and encouraged patient to call with any questions or concerns.  Follow up needed: Yes.  Monique Bauer reports that she is doing well overall; we plan to follow up by phone postoperatively for a pastoral check-in.   [address] Rush Barer, [place], [facility] Pager [phone] Voicemail [phone]

## 2023-09-08 NOTE — Assessment & Plan Note (Signed)
08/31/2023:Screening mammogram detected left breast mass 0.6 cm at 10 o'clock position, axilla negative, biopsy revealed grade 1 IDC with intermediate grade DCIS ER 100%, PR 95%, Ki67 10%, HER2 1+ negative  Pathology and radiology counseling: Discussed with the patient, the details of pathology including the type of breast cancer,the clinical staging, the significance of ER, PR and HER-2/neu receptors and the implications for treatment. After reviewing the pathology in detail, we proceeded to discuss the different treatment options between surgery, radiation, chemotherapy, antiestrogen therapies.  Treatment plan: Breast conserving surgery Antiestrogen therapy  We did not feel that she would benefit from radiation. Patient is happy to hear that. Return to clinic after surgery to start antiestrogens.

## 2023-09-08 NOTE — Progress Notes (Addendum)
Cancer Center CONSULT NOTE  Patient Care Team: Wanda Plump, MD as PCP - General Tyrone Schimke, MD as Consulting Physician (Ophthalmology) Donalee Citrin, MD as Consulting Physician (Neurosurgery) Radionchenko, Lucious Groves, MD as Referring Physician (Ophthalmology) Pershing Proud, RN as Oncology Nurse Navigator Donnelly Angelica, RN as Oncology Nurse Navigator Griselda Miner, MD as Consulting Physician (General Surgery) Antony Blackbird, MD as Consulting Physician (Radiation Oncology) Serena Croissant, MD as Consulting Physician (Hematology and Oncology)  CHIEF COMPLAINTS/PURPOSE OF CONSULTATION:  Newly diagnosed breast cancer  HISTORY OF PRESENTING ILLNESS:  Screening mammogram detected left breast mass at 10 o'clock position 0.6 cm in size axilla negative biopsy revealed grade 1 IDC with intermediate DCIS is ER/PR positive HER2 negative with a Ki67 10%.  She was presented this morning to the multidisciplinary tumor board and she is here today for treatment plan.   I reviewed her records extensively and collaborated the history with the patient.  SUMMARY OF ONCOLOGIC HISTORY: Oncology History  Malignant neoplasm of upper-inner quadrant of left breast in female, estrogen receptor positive (HCC)  08/31/2023 Initial Diagnosis   Screening mammogram detected left breast mass 0.6 cm at 10 o'clock position, axilla negative, biopsy revealed grade 1 IDC with intermediate grade DCIS ER 100%, PR 95%, Ki67 10%, HER2 1+ negative      MEDICAL HISTORY:  Past Medical History:  Diagnosis Date   Allergy 1960   Anxiety    Cervical myelopathy (HCC)    Diabetes (HCC) 12/2012   Family history of adverse reaction to anesthesia    had problems waking up pt father    GERD (gastroesophageal reflux disease)    Glaucoma    History of hiatal hernia    Hyperlipidemia    Hypertension    Osteoarthritis    Osteopenia    Spiradenoma     SURGICAL HISTORY: Past Surgical History:  Procedure  Laterality Date   ANTERIOR CERVICAL DECOMP/DISCECTOMY FUSION N/A 05/17/2017   Procedure: C4-5/5-6 ACDF;  Surgeon: Donalee Citrin, MD;  Location: [facility] OR;  Service: Neurosurgery;  Laterality: N/A;   CARPAL TUNNEL RELEASE Right 07/2017   SPINE SURGERY  05/17/2017    SOCIAL HISTORY: Social History   Socioeconomic History   Marital status: Married    Spouse name: Ronny   Number of children: 3   Years of education: Not on file   Highest education level: Not on file  Occupational History   Occupation: retired     Associate Professor: RETIRED  Tobacco Use   Smoking status: Former    Current packs/day: 0.00    Average packs/day: 0.3 packs/day for 30.0 years (7.5 ttl pk-yrs)    Types: Cigarettes    Start date: 10/16/1951    Quit date: 10/15/1981    Years since quitting: 41.9   Smokeless tobacco: Never   Tobacco comments:    was a social smoker  Vaping Use   Vaping status: Never Used  Substance and Sexual Activity   Alcohol use: Yes    Alcohol/week: 1.0 standard drink of alcohol    Types: 1 Glasses of wine per week    Comment: Socially and only occasionally   Drug use: No   Sexual activity: Not Currently  Other Topics Concern   Not on file  Social History Narrative   Lives w/ husband (married x 3);  3 daughters live in [place], 5 G-children, 2 GG son    Social Determinants of Health   Financial Resource Strain: Low Risk  (05/25/2023)   Overall  Financial Resource Strain (CARDIA)    Difficulty of Paying Living Expenses: Not hard at all  Food Insecurity: No Food Insecurity (05/25/2023)   Hunger Vital Sign    Worried About Running Out of Food in the Last Year: Never true    Ran Out of Food in the Last Year: Never true  Transportation Needs: No Transportation Needs (05/25/2023)   PRAPARE - Administrator, Civil Service (Medical): No    Lack of Transportation (Non-Medical): No  Physical Activity: Insufficiently Active (05/25/2023)   Exercise Vital Sign    Days of Exercise per Week: 3 days     Minutes of Exercise per Session: 30 min  Stress: No Stress Concern Present (05/25/2023)   [employer] of Occupational Health - Occupational Stress Questionnaire    Feeling of Stress : Not at all  Social Connections: Unknown (05/25/2023)   Social Connection and Isolation Panel [NHANES]    Frequency of Communication with Friends and Family: More than three times a week    Frequency of Social Gatherings with Friends and Family: Twice a week    Attends Religious Services: Not on Insurance claims handler of Clubs or Organizations: Yes    Attends Banker Meetings: More than 4 times per year    Marital Status: Married  Catering manager Violence: Not At Risk (04/22/2022)   Humiliation, Afraid, Rape, and Kick questionnaire    Fear of Current or Ex-Partner: No    Emotionally Abused: No    Physically Abused: No    Sexually Abused: No    FAMILY HISTORY: Family History  Problem Relation Age of Onset   Breast cancer Other        aunt    Coronary artery disease Mother        M at age 86 and F   Diabetes Mother        M, F, daughter, sister   Hypertension Mother    Arthritis Mother    Early death Mother    Varicose Veins Mother    Stroke Father    Diabetes Father    Asthma Sister    Diabetes Sister    Cancer Daughter    Diabetes Daughter    Hypertension Daughter    Cancer Daughter    Hypertension Daughter    Colon cancer Neg Hx     ALLERGIES:  is allergic to oxycodone hcl, sulfa antibiotics, and sulfamethoxazole-trimethoprim.  MEDICATIONS:  Current Outpatient Medications  Medication Sig Dispense Refill   atorvastatin (LIPITOR) 40 MG tablet TAKE 1 TABLET BY MOUTH AT  BEDTIME 90 tablet 3   Calcium Carb-Cholecalciferol (CALCIUM 500+D PO) Take 1 tablet by mouth 2 (two) times daily.     Cyanocobalamin (B-12) 5000 MCG CAPS Take 1 tablet by mouth daily.     dorzolamide-timolol (COSOPT) 2-0.5 % ophthalmic solution Place 1 drop into both eyes 2 (two) times daily. 10 mL 11    GLUCOSAMINE-CHONDROITIN PO Take 1 tablet by mouth daily.     glucose blood (ONETOUCH VERIO) test strip CHECK BLOOD SUGAR DAILY 100 strip 3   Lancets (ONETOUCH DELICA PLUS LANCET30G) MISC CHECK BLOOD SUGAR ONCE  DAILY 100 each 12   lisinopril-hydrochlorothiazide (ZESTORETIC) 10-12.5 MG tablet TAKE 2 TABLETS BY MOUTH DAILY 180 tablet 3   metFORMIN (GLUCOPHAGE) 1000 MG tablet Take 1.5 tablets (1,500 mg total) by mouth with breakfast, with lunch, and with evening meal. 45 tablet 0   Multiple Vitamin (MULTIVITAMIN WITH MINERALS) TABS tablet Take 1 tablet  by mouth daily.     pantoprazole (PROTONIX) 40 MG tablet TAKE 1 TABLET BY MOUTH DAILY 90 tablet 3   No current facility-administered medications for this visit.    REVIEW OF SYSTEMS:   Constitutional: Denies fevers, chills or abnormal night sweats Breast:  Denies any palpable lumps or discharge All other systems were reviewed with the patient and are negative.  PHYSICAL EXAMINATION: ECOG PERFORMANCE STATUS: 1 - Symptomatic but completely ambulatory  Vitals:   09/08/23 1234  BP: (!) 180/86  Pulse: 80  Resp: 18  Temp: 97.7 F (36.5 C)  SpO2: 98%   Filed Weights   09/08/23 1234  Weight: 136 lb 6.4 oz (61.9 kg)    GENERAL:alert, no distress and comfortable    LABORATORY DATA:  I have reviewed the data as listed Lab Results  Component Value Date   WBC 8.9 09/08/2023   HGB 12.8 09/08/2023   HCT 39.1 09/08/2023   MCV 86.5 09/08/2023   PLT 299 09/08/2023   Lab Results  Component Value Date   NA 142 09/08/2023   K 3.8 09/08/2023   CL 106 09/08/2023   CO2 26 09/08/2023    RADIOGRAPHIC STUDIES: I have personally reviewed the radiological reports and agreed with the findings in the report.  ASSESSMENT AND PLAN:  Malignant neoplasm of upper-inner quadrant of left breast in female, estrogen receptor positive (HCC) 08/31/2023:Screening mammogram detected left breast mass 0.6 cm at 10 o'clock position, axilla negative, biopsy  revealed grade 1 IDC with intermediate grade DCIS ER 100%, PR 95%, Ki67 10%, HER2 1+ negative  Pathology and radiology counseling: Discussed with the patient, the details of pathology including the type of breast cancer,the clinical staging, the significance of ER, PR and HER-2/neu receptors and the implications for treatment. After reviewing the pathology in detail, we proceeded to discuss the different treatment options between surgery, radiation, chemotherapy, antiestrogen therapies.  Treatment plan: Breast conserving surgery Antiestrogen therapy  We did not feel that she would benefit from radiation. Patient is happy to hear that. Her daughter is also my patient. Return to clinic after surgery to start antiestrogens.        All questions were answered. The patient knows to call the clinic with any problems, questions or concerns.    Tamsen Meek, MD 09/08/23

## 2023-09-08 NOTE — Progress Notes (Signed)
Radiation Oncology         [phone] ________________________________  Multidisciplinary Breast Oncology Clinic [facility]) Initial Outpatient Consultation  Name: Monique Bauer MRN: [id number]  Date: 09/08/2023  DOB: May 31, 1936  CC:Paz, Nolon Rod, MD  Monique Miner, MD   REFERRING PHYSICIAN: Chevis Pretty III, MD  DIAGNOSIS: The encounter diagnosis was Malignant neoplasm of upper-inner quadrant of left breast in female, estrogen receptor positive (HCC).  Stage IA (cT1b, cN0, cM0) Left Breast UIQ, Invasive ductal carcinoma with intermediate grade DCIS, ER+ / PR+ / Her2-, Grade 1    ICD-10-CM   1. Malignant neoplasm of upper-inner quadrant of left breast in female, estrogen receptor positive (HCC)  C50.212    Z17.0       HISTORY OF PRESENT ILLNESS::Monique Bauer is a 87 y.o. female who is presenting to the office today for evaluation of her newly diagnosed breast cancer. She is accompanied by her husband. She is doing well overall.   She had routine screening mammography on 08/25/23 showing a possible abnormality in the left breast. She underwent a left breast diagnostic mammography with tomography and left breast ultrasonography at [facility] on 08/31/23 showing: a 0.6 cm mass in the 10 o'clock left breast suspicious of malignancy.  Biopsy of the 10 o'clock left breast on 09/02/23 showed: grade 1 invasive ductal carcinoma measuring 7 mm in the greatest linear extent of the sample with intermediate grade DCIS. Prognostic indicators significant for: estrogen receptor, 100% positive with strong staining intensity and progesterone receptor, 95% positive with moderate-strong staining intensity. Proliferation marker Ki67 at 10%%. HER2 negative.  Menarche: 87 years old Age at first live birth: 87 years old GP: 3 LMP: in 1989  Contraceptive: never used  HRT: yes, for 10 years and stopped in 2002   The patient was referred today for presentation in the multidisciplinary conference.  Radiology  studies and pathology slides were presented there for review and discussion of treatment options.  A consensus was discussed regarding potential next steps.  PREVIOUS RADIATION THERAPY: No  PAST MEDICAL HISTORY:  Past Medical History:  Diagnosis Date   Allergy 1960   Anxiety    Cervical myelopathy (HCC)    Diabetes (HCC) 12/2012   Family history of adverse reaction to anesthesia    had problems waking up pt father    GERD (gastroesophageal reflux disease)    Glaucoma    History of hiatal hernia    Hyperlipidemia    Hypertension    Osteoarthritis    Osteopenia    Spiradenoma     PAST SURGICAL HISTORY: Past Surgical History:  Procedure Laterality Date   ANTERIOR CERVICAL DECOMP/DISCECTOMY FUSION N/A 05/17/2017   Procedure: C4-5/5-6 ACDF;  Surgeon: Donalee Citrin, MD;  Location: [facility] OR;  Service: Neurosurgery;  Laterality: N/A;   CARPAL TUNNEL RELEASE Right 07/2017   SPINE SURGERY  05/17/2017    FAMILY HISTORY:  Family History  Problem Relation Age of Onset   Coronary artery disease Mother        M at age 58 and F   Diabetes Mother        M, F, daughter, sister   Hypertension Mother    Arthritis Mother    Early death Mother    Varicose Veins Mother    Stroke Father    Diabetes Father    Asthma Sister    Diabetes Sister    Breast cancer Paternal Aunt        dx 37s   Kidney cancer Daughter  59   Diabetes Daughter    Hypertension Daughter    Breast cancer Daughter 75   Hypertension Daughter    Colon cancer Neg Hx     SOCIAL HISTORY:  Social History   Socioeconomic History   Marital status: Married    Spouse name: Ronny   Number of children: 3   Years of education: Not on file   Highest education level: Not on file  Occupational History   Occupation: retired     Associate Professor: RETIRED  Tobacco Use   Smoking status: Former    Current packs/day: 0.00    Average packs/day: 0.3 packs/day for 30.0 years (7.5 ttl pk-yrs)    Types: Cigarettes    Start date:  10/16/1951    Quit date: 10/15/1981    Years since quitting: 41.9   Smokeless tobacco: Never   Tobacco comments:    was a social smoker  Vaping Use   Vaping status: Never Used  Substance and Sexual Activity   Alcohol use: Yes    Alcohol/week: 1.0 standard drink of alcohol    Types: 1 Glasses of wine per week    Comment: Socially and only occasionally   Drug use: No   Sexual activity: Not Currently  Other Topics Concern   Not on file  Social History Narrative   Lives w/ husband (married x 3);  3 daughters live in [place], 5 G-children, 2 GG son    Social Determinants of Health   Financial Resource Strain: Low Risk  (05/25/2023)   Overall Financial Resource Strain (CARDIA)    Difficulty of Paying Living Expenses: Not hard at all  Food Insecurity: No Food Insecurity (05/25/2023)   Hunger Vital Sign    Worried About Running Out of Food in the Last Year: Never true    Ran Out of Food in the Last Year: Never true  Transportation Needs: No Transportation Needs (05/25/2023)   PRAPARE - Administrator, Civil Service (Medical): No    Lack of Transportation (Non-Medical): No  Physical Activity: Insufficiently Active (05/25/2023)   Exercise Vital Sign    Days of Exercise per Week: 3 days    Minutes of Exercise per Session: 30 min  Stress: No Stress Concern Present (05/25/2023)   [employer] of Occupational Health - Occupational Stress Questionnaire    Feeling of Stress : Not at all  Social Connections: Unknown (05/25/2023)   Social Connection and Isolation Panel [NHANES]    Frequency of Communication with Friends and Family: More than three times a week    Frequency of Social Gatherings with Friends and Family: Twice a week    Attends Religious Services: Not on Marketing executive or Organizations: Yes    Attends Engineer, structural: More than 4 times per year    Marital Status: Married    ALLERGIES:  Allergies  Allergen Reactions   Oxycodone Hcl  Itching and Rash   Sulfa Antibiotics Rash    Rash all over body    Sulfamethoxazole-Trimethoprim Rash    rash all over    MEDICATIONS:  Current Outpatient Medications  Medication Sig Dispense Refill   atorvastatin (LIPITOR) 40 MG tablet TAKE 1 TABLET BY MOUTH AT  BEDTIME 90 tablet 3   Calcium Carb-Cholecalciferol (CALCIUM 500+D PO) Take 1 tablet by mouth 2 (two) times daily.     Cyanocobalamin (B-12) 5000 MCG CAPS Take 1 tablet by mouth daily.     dorzolamide-timolol (COSOPT) 2-0.5 % ophthalmic solution  Place 1 drop into both eyes 2 (two) times daily. 10 mL 11   GLUCOSAMINE-CHONDROITIN PO Take 1 tablet by mouth daily.     glucose blood (ONETOUCH VERIO) test strip CHECK BLOOD SUGAR DAILY 100 strip 3   Lancets (ONETOUCH DELICA PLUS LANCET30G) MISC CHECK BLOOD SUGAR ONCE  DAILY 100 each 12   lisinopril-hydrochlorothiazide (ZESTORETIC) 10-12.5 MG tablet TAKE 2 TABLETS BY MOUTH DAILY 180 tablet 3   metFORMIN (GLUCOPHAGE) 1000 MG tablet Take 1.5 tablets (1,500 mg total) by mouth with breakfast, with lunch, and with evening meal. 45 tablet 0   Multiple Vitamin (MULTIVITAMIN WITH MINERALS) TABS tablet Take 1 tablet by mouth daily.     pantoprazole (PROTONIX) 40 MG tablet TAKE 1 TABLET BY MOUTH DAILY 90 tablet 3   No current facility-administered medications for this encounter.    REVIEW OF SYSTEMS: A 10+ POINT REVIEW OF SYSTEMS WAS OBTAINED including neurology, dermatology, psychiatry, cardiac, respiratory, lymph, extremities, GI, GU, musculoskeletal, constitutional, reproductive, HEENT. On the provided form, she reports wearing glasses, arthritis, and diabetes. She denies any other symptoms.    PHYSICAL EXAM:     09/08/2023  Vitals with BMI   Height 5\' 0"    Weight 136 lbs 6 oz   BMI 26.64   Systolic 180 !   Diastolic 86 !   Pulse 80     Legend: ! Abnormal  Lungs are clear to auscultation bilaterally. Heart has regular rate and rhythm. No palpable cervical, supraclavicular, or  axillary adenopathy. Abdomen soft, non-tender, normal bowel sounds. Breast: Right breast with no palpable mass, nipple discharge, or bleeding. Left breast with small Band-Aid in place in the medial aspect (10 o'clock) breast at the biopsy site. No significant bruising appreciated.   KPS = 90  100 - Normal; no complaints; no evidence of disease. 90   - Able to carry on normal activity; minor signs or symptoms of disease. 80   - Normal activity with effort; some signs or symptoms of disease. 62   - Cares for self; unable to carry on normal activity or to do active work. 60   - Requires occasional assistance, but is able to care for most of his personal needs. 50   - Requires considerable assistance and frequent medical care. 40   - Disabled; requires special care and assistance. 30   - Severely disabled; hospital admission is indicated although death not imminent. 20   - Very sick; hospital admission necessary; active supportive treatment necessary. 10   - Moribund; fatal processes progressing rapidly. 0     - Dead  Karnofsky DA, Abelmann WH, Craver LS and Burchenal [facility] [phone]) The use of the nitrogen mustards in the palliative treatment of carcinoma: with particular reference to bronchogenic carcinoma Cancer 1 634-56  LABORATORY DATA:  Lab Results  Component Value Date   WBC 8.9 09/08/2023   HGB 12.8 09/08/2023   HCT 39.1 09/08/2023   MCV 86.5 09/08/2023   PLT 299 09/08/2023   Lab Results  Component Value Date   NA 142 09/08/2023   K 3.8 09/08/2023   CL 106 09/08/2023   CO2 26 09/08/2023   Lab Results  Component Value Date   ALT 23 09/08/2023   AST 23 09/08/2023   ALKPHOS 107 09/08/2023   BILITOT 0.4 09/08/2023    PULMONARY FUNCTION TEST:   Review Flowsheet        No data to display          RADIOGRAPHY: No results found.  IMPRESSION: Stage IA (cT1b, cN0, cM0) Left Breast UIQ, Invasive ductal carcinoma with intermediate grade DCIS, ER+ / PR+ / Her2-, Grade 1    Patient will be a good candidate for breast conservation with radiotherapy to the left breast. We discussed the general course of radiation, potential side effects, and toxicities with radiation and the patient is interested in this approach.   Depending on final pathologic findings the patient may be able to omit radiation therapy if she proceeds with adjuvant hormonal therapy.  If she decides to have radiation therapy she would be a candidate for ultra hypofractionated radiation therapy given once a week for 5 weeks.   PLAN:  Genetics  Left lumpectomy  +/- adjuvant radiation therapy  Aromatase inhibitor    ------------------------------------------------  Billie Lade, PhD, MD  This document serves as a record of services personally performed by Antony Blackbird, MD. It was created on his behalf by Neena Rhymes, a trained medical scribe. The creation of this record is based on the scribe's personal observations and the provider's statements to them. This document has been checked and approved by the attending provider.

## 2023-09-08 NOTE — Progress Notes (Signed)
REFERRING PROVIDER: Serena Croissant, MD [address] [place],  [place] [address]  PRIMARY PROVIDER:  Wanda Plump, MD  PRIMARY REASON FOR VISIT:  1. Malignant neoplasm of upper-inner quadrant of left breast in female, estrogen receptor positive (HCC)   2. Family history of breast cancer   3. Family history of kidney cancer     HISTORY OF PRESENT ILLNESS:   Monique Bauer, a 87 y.o. female, was seen for a [place] cancer genetics consultation at the request of Dr. Pamelia Hoit due to a personal and family history of breast cancer.  Monique Bauer presents to clinic today to discuss the possibility of a hereditary predisposition to cancer, to discuss genetic testing, and to further clarify her future cancer risks, as well as potential cancer risks for family members.   In 2024, at the age of 58, Monique Bauer was diagnosed with invasive ductal carcinoma of the left breast (ER+/PR+/HER2-). The treatment plan includes breast conserving surgery and anti-estrogens.    CANCER HISTORY:  Oncology History  Malignant neoplasm of upper-inner quadrant of left breast in female, estrogen receptor positive (HCC)  08/31/2023 Initial Diagnosis   Screening mammogram detected left breast mass 0.6 cm at 10 o'clock position, axilla negative, biopsy revealed grade 1 IDC with intermediate grade DCIS ER 100%, PR 95%, Ki67 10%, HER2 1+ negative   09/08/2023 Cancer Staging   Staging form: Breast, AJCC 8th Edition - Clinical stage from 09/08/2023: Stage IA (cT1b, cN0, cM0, G1, ER+, PR+, HER2-) - Signed by Serena Croissant, MD on 09/08/2023 Stage prefix: Initial diagnosis Histologic grading system: 3 grade system Laterality: Left Staged by: Pathologist and managing physician National guidelines used in treatment planning: Yes Type of national guideline used in treatment planning: NCCN      Past Medical History:  Diagnosis Date   Allergy 1960   Anxiety    Cervical myelopathy (HCC)    Diabetes (HCC) 12/2012   Family  history of adverse reaction to anesthesia    had problems waking up pt father    GERD (gastroesophageal reflux disease)    Glaucoma    History of hiatal hernia    Hyperlipidemia    Hypertension    Osteoarthritis    Osteopenia    Spiradenoma     Past Surgical History:  Procedure Laterality Date   ANTERIOR CERVICAL DECOMP/DISCECTOMY FUSION N/A 05/17/2017   Procedure: C4-5/5-6 ACDF;  Surgeon: Donalee Citrin, MD;  Location: [facility] OR;  Service: Neurosurgery;  Laterality: N/A;   CARPAL TUNNEL RELEASE Right 07/2017   SPINE SURGERY  05/17/2017    FAMILY HISTORY:  We obtained a detailed, 4-generation family history.  Significant diagnoses are listed below: Family History  Problem Relation Age of Onset   Breast cancer Paternal Aunt        dx 49s   Kidney cancer Daughter 49   Breast cancer Daughter 73    Monique Bauer is unaware of previous family history of genetic testing for hereditary cancer risks.  There is no reported Ashkenazi Jewish ancestry. There is no known consanguinity.  GENETIC COUNSELING ASSESSMENT: Monique Bauer is a 87 y.o. female with a personal and family history which is somewhat suggestive of a hereditary cancer syndrome and predisposition to cancer given the presence of breast cancer in multiple generations. We, therefore, discussed and recommended the following at today's visit.   DISCUSSION: We discussed that 5 - 10% of cancer is hereditary.  Most cases of hereditary breast cancer are associated with mutations in BRCA1/2.  There are  other genes that can be associated with hereditary breast cancer syndromes.  We discussed that testing is beneficial for several reasons including knowing how to follow individuals for their cancer risks and understanding if other family members could be at risk for cancer and allowing them to undergo genetic testing.   We reviewed the characteristics, features and inheritance patterns of hereditary cancer syndromes. We also discussed genetic testing,  including the appropriate family members to test, the process of testing, insurance coverage and turn-around-time for results. We discussed the implications of a negative, positive, carrier and/or variant of uncertain significant result. We recommended Monique Bauer pursue genetic testing for a panel that includes genes associated with breast, kidney, and other cancers.   The CancerNext-Expanded gene panel offered by [facility] and includes sequencing, rearrangement, and RNA analysis for the following 71 genes:  AIP, ALK, APC, ATM, BAP1, BARD1, BMPR1A, BRCA1, BRCA2, BRIP1, CDC73, CDH1, CDK4, CDKN1B, CDKN2A, CHEK2, DICER1, FH, FLCN, KIF1B, LZTR1,MAX, MEN1, MET, MLH1, MSH2, MSH6, MUTYH, NF1, NF2, NTHL1, PALB2, PHOX2B, PMS2, POT1, PRKAR1A, PTCH1, PTEN, RAD51C,RAD51D, RB1, RET, SDHA, SDHAF2, SDHB, SDHC, SDHD, SMAD4, SMARCA4, SMARCB1, SMARCE1, STK11, SUFU, TMEM127, TP53,TSC1, TSC2 and VHL (sequencing and deletion/duplication); AXIN2, CTNNA1, EGFR, EGLN1, HOXB13, KIT, MITF, MSH3, PDGFRA, POLD1 and POLE (sequencing only); EPCAM and GREM1 (deletion/duplication only).   Based on Monique Bauer's personal history of breast cancer and family history of breast cancer in two close relatives, she meets medical criteria for genetic testing. Despite that she meets criteria, she may still have an out of pocket cost. We discussed that if her out of pocket cost for testing is over $100, the laboratory should contact her and discuss the self-pay prices and/or patient pay assistance programs.    PLAN: After considering the risks, benefits, and limitations, Monique Bauer provided informed consent to pursue genetic testing and the blood sample was sent to [facility] for analysis of the CancerNext-Expanded +RNA Panel. Results should be available within approximately 3 weeks' time, at which point they will be disclosed by telephone to Monique Bauer, as will any additional recommendations warranted by these results. Monique Bauer will receive a  summary of her genetic counseling visit and a copy of her results once available. This information will also be available in Epic.   Monique Bauer questions were answered to her satisfaction today. Our contact information was provided should additional questions or concerns arise. Thank you for the referral and allowing [place] to share in the care of your patient.   Monique Sample M. Rennie Plowman, MS, [facility] Genetic Counselor [email or web address] ([phone]) [phone]  The patient was seen for less than 15 minutes of face to face counseling.

## 2023-09-13 ENCOUNTER — Telehealth: Payer: Self-pay | Admitting: Hematology and Oncology

## 2023-09-13 NOTE — Telephone Encounter (Signed)
Left patient a message regarding scheduled appointment times/dates with [facility] per In Basket Message on 9/30

## 2023-09-15 ENCOUNTER — Encounter: Payer: Self-pay | Admitting: *Deleted

## 2023-09-15 ENCOUNTER — Telehealth: Payer: Self-pay | Admitting: *Deleted

## 2023-09-15 NOTE — Telephone Encounter (Signed)
Spoke with patient to follow up from [facility] 9/25 and assess navigation needs. Patient denies any questions or concerns at this time. Encouraged her to call should anything arise. Patient verbalized understanding.

## 2023-09-15 NOTE — Progress Notes (Signed)
Surgical Instructions    Your procedure is scheduled on September 22, 2023.  Report to [facility] Main Entrance "A" at 10:00 A.M., then check in with the Admitting office.  Call this number if you have problems the morning of surgery:  [phone]  If you have any questions prior to your surgery date call [phone]: Open Monday-Friday 8am-4pm If you experience any cold or flu symptoms such as cough, fever, chills, shortness of breath, etc. between now and your scheduled surgery, please notify us at the above number.     Remember:  Do not eat after midnight the night before your surgery  You may drink clear liquids until 9:00 the morning of your surgery.   Clear liquids allowed are: Water, Non-Citrus Juices (without pulp), Carbonated Beverages, Clear Tea, Black Coffee Only (NO MILK, CREAM OR POWDERED CREAMER of any kind), and Gatorade.    Take these medicines the morning of surgery with A SIP OF WATER  dorzolamide-timolol (COSOPT)  pantoprazole (PROTONIX)   IF needed  As of today, STOP taking any Aspirin (unless otherwise instructed by your surgeon) Aleve, Naproxen, Ibuprofen, Motrin, Advil, Goody's, BC's, all herbal medications, fish oil, and all vitamins.  This includes your TURMERIC CURCUMIN          WHAT DO I DO ABOUT MY DIABETES MEDICATION?   Do not take oral diabetes medicines (pills) the morning of surgery.        metFORMIN (GLUCOPHAGE)   The day of surgery, do not take other diabetes injectables, including Byetta (exenatide), Bydureon (exenatide ER), Victoza (liraglutide), or Trulicity (dulaglutide).  If your CBG is greater than 220 mg/dL, you may take  of your sliding scale (correction) dose of insulin.   HOW TO MANAGE YOUR DIABETES BEFORE AND AFTER SURGERY  Why is it important to control my blood sugar before and after surgery? Improving blood sugar levels before and after surgery helps healing and can limit problems. A way of improving blood sugar control is  eating a healthy diet by:  Eating less sugar and carbohydrates  Increasing activity/exercise  Talking with your doctor about reaching your blood sugar goals High blood sugars (greater than 180 mg/dL) can raise your risk of infections and slow your recovery, so you will need to focus on controlling your diabetes during the weeks before surgery. Make sure that the doctor who takes care of your diabetes knows about your planned surgery including the date and location.  How do I manage my blood sugar before surgery? Check your blood sugar at least 4 times a day, starting 2 days before surgery, to make sure that the level is not too high or low.  Check your blood sugar the morning of your surgery when you wake up and every 2 hours until you get to the Short Stay unit.  If your blood sugar is less than 70 mg/dL, you will need to treat for low blood sugar: Do not take insulin. Treat a low blood sugar (less than 70 mg/dL) with  cup of clear juice (cranberry or apple), 4 glucose tablets, OR glucose gel. Recheck blood sugar in 15 minutes after treatment (to make sure it is greater than 70 mg/dL). If your blood sugar is not greater than 70 mg/dL on recheck, call [phone] for further instructions. Report your blood sugar to the short stay nurse when you get to Short Stay.  If you are admitted to the hospital after surgery: Your blood sugar will be checked by the staff and you will probably  be given insulin after surgery (instead of oral diabetes medicines) to make sure you have good blood sugar levels. The goal for blood sugar control after surgery is 80-180 mg/dL.             Do NOT Smoke (Tobacco/Vaping) for 24 hours prior to your procedure.  If you use a CPAP at night, you may bring your mask/headgear for your overnight stay.   Contacts, glasses, piercing's, hearing aid's, dentures or partials may not be worn into surgery, please bring cases for these belongings.    For patients admitted to  the hospital, discharge time will be determined by your treatment team.   Patients discharged the day of surgery will not be allowed to drive home, and someone needs to stay with them for 24 hours.  SURGICAL WAITING ROOM VISITATION Patients having surgery or a procedure may have no more than 2 support people in the waiting area - these visitors may rotate.   Children under the age of 10 must have an adult with them who is not the patient. If the patient needs to stay at the hospital during part of their recovery, the visitor guidelines for inpatient rooms apply. Pre-op nurse will coordinate an appropriate time for 1 support person to accompany patient in pre-op.  This support person may not rotate.   Please refer to the [facility] website for the visitor guidelines for Inpatients (after your surgery is over and you are in a regular room).    Special instructions:   [place]- Preparing For Surgery  Before surgery, you can play an important role. Because skin is not sterile, your skin needs to be as free of germs as possible. You can reduce the number of germs on your skin by washing with CHG (chlorahexidine gluconate) Soap before surgery.  CHG is an antiseptic cleaner which kills germs and bonds with the skin to continue killing germs even after washing.    Oral Hygiene is also important to reduce your risk of infection.  Remember - BRUSH YOUR TEETH THE MORNING OF SURGERY WITH YOUR REGULAR TOOTHPASTE  Please do not use if you have an allergy to CHG or antibacterial soaps. If your skin becomes reddened/irritated stop using the CHG.  Do not shave (including legs and underarms) for at least 48 hours prior to first CHG shower. It is OK to shave your face.  Please follow these instructions carefully.   Shower the NIGHT BEFORE SURGERY and the MORNING OF SURGERY  If you chose to wash your hair, wash your hair first as usual with your normal shampoo.  After you shampoo, rinse your hair and  body thoroughly to remove the shampoo.  Use CHG Soap as you would any other liquid soap. You can apply CHG directly to the skin and wash gently with a scrungie or a clean washcloth.   Apply the CHG Soap to your body ONLY FROM THE NECK DOWN.  Do not use on open wounds or open sores. Avoid contact with your eyes, ears, mouth and genitals (private parts). Wash Face and genitals (private parts)  with your normal soap.   Wash thoroughly, paying special attention to the area where your surgery will be performed.  Thoroughly rinse your body with warm water from the neck down.  DO NOT shower/wash with your normal soap after using and rinsing off the CHG Soap.  Pat yourself dry with a CLEAN TOWEL.  Wear CLEAN PAJAMAS to bed the night before surgery  Place CLEAN SHEETS on  your bed the night before your surgery  DO NOT SLEEP WITH PETS.   Day of Surgery: Take a shower with CHG soap. Do not wear jewelry or makeup Do not wear lotions, powders, perfumes/colognes, or deodorant. Do not shave 48 hours prior to surgery.  Men may shave face and neck. Do not bring valuables to the hospital.  [facility] is not responsible for any belongings or valuables. Do not wear nail polish, gel polish, artificial nails, or any other type of covering on natural nails (fingers and toes) If you have artificial nails or gel coating that need to be removed by a nail salon, please have this removed prior to surgery. Artificial nails or gel coating may interfere with anesthesia's ability to adequately monitor your vital signs. Wear Clean/Comfortable clothing the morning of surgery Remember to brush your teeth WITH YOUR REGULAR TOOTHPASTE.   Please read over the following fact sheets that you were given.    If you received a COVID test during your pre-op visit  it is requested that you wear a mask when out in public, stay away from anyone that may not be feeling well and notify your surgeon if you develop symptoms. If  you have been in contact with anyone that has tested positive in the last 10 days please notify you surgeon.

## 2023-09-16 ENCOUNTER — Encounter (HOSPITAL_COMMUNITY)
Admission: RE | Admit: 2023-09-16 | Discharge: 2023-09-16 | Disposition: A | Discharge status: Home / Self Care | Payer: Medicare Other | Source: Ambulatory Visit | Attending: General Surgery | Admitting: General Surgery

## 2023-09-16 ENCOUNTER — Other Ambulatory Visit: Payer: Self-pay

## 2023-09-16 ENCOUNTER — Encounter (HOSPITAL_COMMUNITY): Payer: Self-pay

## 2023-09-16 ENCOUNTER — Encounter: Payer: Self-pay | Admitting: Internal Medicine

## 2023-09-16 VITALS — BP 143/61 | HR 68 | Temp 97.9°F | Resp 18 | Ht 60.0 in | Wt 136.1 lb

## 2023-09-16 DIAGNOSIS — Z01818 Encounter for other preprocedural examination: Secondary | ICD-10-CM | POA: Insufficient documentation

## 2023-09-16 LAB — GLUCOSE, CAPILLARY: Glucose-Capillary: 197 mg/dL — ABNORMAL HIGH (ref 70–99)

## 2023-09-16 NOTE — Progress Notes (Addendum)
PCP - Willow Ora, MD Cardiologist - denies  PPM/ICD - denies Device Orders -  Rep Notified -   Chest x-ray - na EKG - 09/16/23 Stress Test - denies ECHO - 2003-per PCP note 10/05/2007 Cardiac Cath - denies  Sleep Study - denies CPAP - no  Fasting Blood Sugar - 105-109 Checks Blood Sugar every morning  Last dose of GLP1 agonist-  na GLP1 instructions:   Blood Thinner Instructions:na Aspirin Instructions:na  ERAS Protcol - clear liquids until 0900 PRE-SURGERY Ensure or G2- no  COVID TEST- na   Anesthesia review: yes- pt for seed placement  Patient denies shortness of breath, fever, cough and chest pain at PAT appointment   All instructions explained to the patient, with a verbal understanding of the material. Patient agrees to go over the instructions while at home for a better understanding. Patient also instructed to wear a mask when out in public prior to surgery. The opportunity to ask questions was provided.

## 2023-09-20 ENCOUNTER — Telehealth: Payer: Self-pay

## 2023-09-20 ENCOUNTER — Other Ambulatory Visit: Payer: Self-pay

## 2023-09-20 MED ORDER — ONETOUCH DELICA PLUS LANCET30G MISC: 12 refills | Status: DC

## 2023-09-20 NOTE — Telephone Encounter (Signed)
PT plan of care signed and faxed back to [facility] at [phone]. Form sent for scanning.

## 2023-09-22 ENCOUNTER — Ambulatory Visit (HOSPITAL_COMMUNITY): Payer: Medicare Other | Admitting: Physician Assistant

## 2023-09-22 ENCOUNTER — Encounter (HOSPITAL_COMMUNITY): Payer: Self-pay | Admitting: General Surgery

## 2023-09-22 ENCOUNTER — Ambulatory Visit (HOSPITAL_BASED_OUTPATIENT_CLINIC_OR_DEPARTMENT_OTHER): Payer: Medicare Other | Admitting: Anesthesiology

## 2023-09-22 ENCOUNTER — Other Ambulatory Visit: Payer: Self-pay

## 2023-09-22 ENCOUNTER — Ambulatory Visit (HOSPITAL_COMMUNITY)
Admission: RE | Admit: 2023-09-22 | Discharge: 2023-09-22 | Disposition: A | Discharge status: Home / Self Care | Payer: Medicare Other | Attending: General Surgery | Admitting: General Surgery

## 2023-09-22 ENCOUNTER — Encounter (HOSPITAL_COMMUNITY)
Admission: RE | Disposition: A | Discharge status: Home / Self Care | Payer: Self-pay | Source: Home / Self Care | Attending: General Surgery

## 2023-09-22 ENCOUNTER — Other Ambulatory Visit (HOSPITAL_BASED_OUTPATIENT_CLINIC_OR_DEPARTMENT_OTHER): Payer: Self-pay

## 2023-09-22 DIAGNOSIS — Z7984 Long term (current) use of oral hypoglycemic drugs: Secondary | ICD-10-CM | POA: Insufficient documentation

## 2023-09-22 DIAGNOSIS — K219 Gastro-esophageal reflux disease without esophagitis: Secondary | ICD-10-CM | POA: Insufficient documentation

## 2023-09-22 DIAGNOSIS — Z170 Estrogen receptor positive status [ER+]: Secondary | ICD-10-CM | POA: Insufficient documentation

## 2023-09-22 DIAGNOSIS — C50912 Malignant neoplasm of unspecified site of left female breast: Secondary | ICD-10-CM | POA: Diagnosis not present

## 2023-09-22 DIAGNOSIS — Z1721 Progesterone receptor positive status: Secondary | ICD-10-CM | POA: Insufficient documentation

## 2023-09-22 DIAGNOSIS — Z79899 Other long term (current) drug therapy: Secondary | ICD-10-CM | POA: Diagnosis not present

## 2023-09-22 DIAGNOSIS — E119 Type 2 diabetes mellitus without complications: Secondary | ICD-10-CM | POA: Diagnosis not present

## 2023-09-22 DIAGNOSIS — I10 Essential (primary) hypertension: Secondary | ICD-10-CM | POA: Diagnosis not present

## 2023-09-22 DIAGNOSIS — Z01818 Encounter for other preprocedural examination: Secondary | ICD-10-CM

## 2023-09-22 DIAGNOSIS — Z87891 Personal history of nicotine dependence: Secondary | ICD-10-CM | POA: Insufficient documentation

## 2023-09-22 DIAGNOSIS — C50212 Malignant neoplasm of upper-inner quadrant of left female breast: Secondary | ICD-10-CM | POA: Insufficient documentation

## 2023-09-22 DIAGNOSIS — K449 Diaphragmatic hernia without obstruction or gangrene: Secondary | ICD-10-CM | POA: Diagnosis not present

## 2023-09-22 HISTORY — PX: BREAST LUMPECTOMY WITH RADIOACTIVE SEED LOCALIZATION: SHX6424

## 2023-09-22 LAB — GLUCOSE, CAPILLARY
Glucose-Capillary: 121 mg/dL — ABNORMAL HIGH (ref 70–99)
Glucose-Capillary: 121 mg/dL — ABNORMAL HIGH (ref 70–99)

## 2023-09-22 SURGERY — BREAST LUMPECTOMY WITH RADIOACTIVE SEED LOCALIZATION
Anesthesia: General | Laterality: Left

## 2023-09-22 MED ORDER — KETAMINE HCL 50 MG/5ML IJ SOSY
PREFILLED_SYRINGE | INTRAMUSCULAR | Status: AC
  Filled 2023-09-22: qty 5

## 2023-09-22 MED ORDER — PROPOFOL 1000 MG/100ML IV EMUL
INTRAVENOUS | Status: AC
  Filled 2023-09-22: qty 100

## 2023-09-22 MED ORDER — HYDROMORPHONE HCL 1 MG/ML IJ SOLN: 0.2500 mg | INTRAMUSCULAR | Status: DC | PRN

## 2023-09-22 MED ORDER — PROPOFOL 10 MG/ML IV BOLUS
INTRAVENOUS | Status: DC | PRN
  Administered 2023-09-22: 150 mg via INTRAVENOUS
  Administered 2023-09-22: 50 mg via INTRAVENOUS

## 2023-09-22 MED ORDER — FENTANYL CITRATE (PF) 250 MCG/5ML IJ SOLN
INTRAMUSCULAR | Status: AC
  Filled 2023-09-22: qty 5

## 2023-09-22 MED ORDER — DEXAMETHASONE SODIUM PHOSPHATE 10 MG/ML IJ SOLN
INTRAMUSCULAR | Status: AC
  Filled 2023-09-22: qty 1

## 2023-09-22 MED ORDER — LIDOCAINE 2% (20 MG/ML) 5 ML SYRINGE
INTRAMUSCULAR | Status: AC
  Filled 2023-09-22: qty 5

## 2023-09-22 MED ORDER — GABAPENTIN 100 MG PO CAPS
100.0000 mg | ORAL_CAPSULE | ORAL | Status: AC
  Administered 2023-09-22: 100 mg via ORAL
  Filled 2023-09-22: qty 1

## 2023-09-22 MED ORDER — LACTATED RINGERS IV SOLN
INTRAVENOUS | Status: DC | PRN
Start: 2023-09-22 — End: 2023-09-22

## 2023-09-22 MED ORDER — ONDANSETRON HCL 4 MG/2ML IJ SOLN
INTRAMUSCULAR | Status: AC
  Filled 2023-09-22: qty 2

## 2023-09-22 MED ORDER — LIDOCAINE 2% (20 MG/ML) 5 ML SYRINGE
INTRAMUSCULAR | Status: DC | PRN
  Administered 2023-09-22: 80 mg via INTRAVENOUS

## 2023-09-22 MED ORDER — CEFAZOLIN SODIUM-DEXTROSE 2-4 GM/100ML-% IV SOLN
2.0000 g | INTRAVENOUS | Status: AC
  Administered 2023-09-22: 2 g via INTRAVENOUS
  Filled 2023-09-22: qty 100

## 2023-09-22 MED ORDER — CHLORHEXIDINE GLUCONATE CLOTH 2 % EX PADS: 6.0000 | MEDICATED_PAD | Freq: Once | CUTANEOUS | Status: DC

## 2023-09-22 MED ORDER — FENTANYL CITRATE (PF) 250 MCG/5ML IJ SOLN
INTRAMUSCULAR | Status: DC | PRN
  Administered 2023-09-22 (×2): 25 ug via INTRAVENOUS

## 2023-09-22 MED ORDER — ACETAMINOPHEN 500 MG PO TABS
1000.0000 mg | ORAL_TABLET | ORAL | Status: AC
  Administered 2023-09-22: 1000 mg via ORAL
  Filled 2023-09-22: qty 2

## 2023-09-22 MED ORDER — EPHEDRINE 5 MG/ML INJ
INTRAVENOUS | Status: AC
  Filled 2023-09-22: qty 5

## 2023-09-22 MED ORDER — ORAL CARE MOUTH RINSE: 15.0000 mL | Freq: Once | OROMUCOSAL | Status: AC

## 2023-09-22 MED ORDER — DEXAMETHASONE SODIUM PHOSPHATE 10 MG/ML IJ SOLN
INTRAMUSCULAR | Status: DC | PRN
  Administered 2023-09-22: 5 mg via INTRAVENOUS

## 2023-09-22 MED ORDER — TRAMADOL HCL 50 MG PO TABS
50.0000 mg | ORAL_TABLET | Freq: Four times a day (QID) | ORAL | 0 refills | Status: DC | PRN
  Filled 2023-09-22: qty 15, 4d supply, fill #0

## 2023-09-22 MED ORDER — SODIUM CHLORIDE 0.9% FLUSH: 3.0000 mL | Freq: Two times a day (BID) | INTRAVENOUS | Status: DC

## 2023-09-22 MED ORDER — CHLORHEXIDINE GLUCONATE 0.12 % MT SOLN
15.0000 mL | Freq: Once | OROMUCOSAL | Status: AC
  Administered 2023-09-22: 15 mL via OROMUCOSAL
  Filled 2023-09-22: qty 15

## 2023-09-22 MED ORDER — 0.9 % SODIUM CHLORIDE (POUR BTL) OPTIME
TOPICAL | Status: DC | PRN
  Administered 2023-09-22: 1000 mL

## 2023-09-22 MED ORDER — LACTATED RINGERS IV SOLN: INTRAVENOUS | Status: DC

## 2023-09-22 MED ORDER — EPHEDRINE SULFATE-NACL 50-0.9 MG/10ML-% IV SOSY
PREFILLED_SYRINGE | INTRAVENOUS | Status: DC | PRN
  Administered 2023-09-22: 10 mg via INTRAVENOUS

## 2023-09-22 MED ORDER — BUPIVACAINE-EPINEPHRINE 0.25% -1:200000 IJ SOLN
INTRAMUSCULAR | Status: DC | PRN
  Administered 2023-09-22: 15 mL

## 2023-09-22 MED ORDER — PROPOFOL 500 MG/50ML IV EMUL
INTRAVENOUS | Status: DC | PRN
  Administered 2023-09-22: 125 ug/kg/min via INTRAVENOUS

## 2023-09-22 MED ORDER — BUPIVACAINE-EPINEPHRINE (PF) 0.25% -1:200000 IJ SOLN
INTRAMUSCULAR | Status: AC
  Filled 2023-09-22: qty 30

## 2023-09-22 MED ORDER — ONDANSETRON HCL 4 MG/2ML IJ SOLN
INTRAMUSCULAR | Status: DC | PRN
  Administered 2023-09-22: 4 mg via INTRAVENOUS

## 2023-09-22 SURGICAL SUPPLY — 29 items
ADH SKN CLS APL DERMABOND .7 (GAUZE/BANDAGES/DRESSINGS) ×1
APL PRP STRL LF DISP 70% ISPRP (MISCELLANEOUS) ×1
APPLIER CLIP 9.375 MED OPEN (MISCELLANEOUS) ×1
APR CLP MED 9.3 20 MLT OPN (MISCELLANEOUS) ×1
BINDER BREAST LRG (GAUZE/BANDAGES/DRESSINGS) IMPLANT
CANISTER SUCT 3000ML PPV (MISCELLANEOUS) ×2 IMPLANT
CHLORAPREP W/TINT 26 (MISCELLANEOUS) ×2 IMPLANT
CLIP APPLIE 9.375 MED OPEN (MISCELLANEOUS) IMPLANT
COVER PROBE W GEL 5X96 (DRAPES) ×2 IMPLANT
COVER SURGICAL LIGHT HANDLE (MISCELLANEOUS) ×2 IMPLANT
DERMABOND ADVANCED .7 DNX12 (GAUZE/BANDAGES/DRESSINGS) ×2 IMPLANT
DEVICE DUBIN SPECIMEN MAMMOGRA (MISCELLANEOUS) ×2 IMPLANT
DRAPE CHEST BREAST 15X10 FENES (DRAPES) ×2 IMPLANT
ELECT COATED BLADE 2.86 ST (ELECTRODE) ×2 IMPLANT
ELECT REM PT RETURN 9FT ADLT (ELECTROSURGICAL) ×1
ELECTRODE REM PT RTRN 9FT ADLT (ELECTROSURGICAL) ×2 IMPLANT
GLOVE BIO SURGEON STRL SZ7.5 (GLOVE) ×4 IMPLANT
GOWN STRL REUS W/ TWL LRG LVL3 (GOWN DISPOSABLE) ×4 IMPLANT
GOWN STRL REUS W/TWL LRG LVL3 (GOWN DISPOSABLE) ×2
KIT BASIN OR (CUSTOM PROCEDURE TRAY) ×2 IMPLANT
KIT MARKER MARGIN INK (KITS) ×2 IMPLANT
NDL HYPO 25GX1X1/2 BEV (NEEDLE) ×2 IMPLANT
NEEDLE HYPO 25GX1X1/2 BEV (NEEDLE) ×1
NS IRRIG 1000ML POUR BTL (IV SOLUTION) ×2 IMPLANT
PACK GENERAL/GYN (CUSTOM PROCEDURE TRAY) ×2 IMPLANT
SUT MNCRL AB 4-0 PS2 18 (SUTURE) ×2 IMPLANT
SUT VIC AB 3-0 SH 18 (SUTURE) ×2 IMPLANT
SYR CONTROL 10ML LL (SYRINGE) ×2 IMPLANT
TOWEL GREEN STERILE (TOWEL DISPOSABLE) ×2 IMPLANT

## 2023-09-22 NOTE — H&P (Signed)
REFERRING PHYSICIAN: Sabas Sous, MD PROVIDER: Lindell Noe, MD MRN: [id number] DOB: [birth year] Subjective   Chief Complaint: Breast Cancer  History of Present Illness: Monique Bauer is a 87 y.o. female who is seen today as an office consultation for evaluation of Breast Cancer  We are asked to see the patient in consultation by Dr. Pamelia Hoit to evaluate her for a new left breast cancer. The patient is a 87 year old white female who recently went for a routine screening mammogram. At that time she was found to have a 6 mm mass in the upper inner central left breast. The axilla looked normal. The mass was biopsied and came back as a grade 1 invasive ductal cancer that was ER and PR positive and HER2 negative with a Ki-67 of 10%. She does have some hypertension and diabetes. She quit smoking over 30 years ago. She does have a daughter with breast cancer.  Review of Systems: A complete review of systems was obtained from the patient. I have reviewed this information and discussed as appropriate with the patient. See HPI as well for other ROS.  ROS   Medical History: History reviewed. No pertinent past medical history.  Patient Active Problem List  Diagnosis  Spiradenoma  PVD (posterior vitreous detachment), both eyes  Pseudophakia of both eyes  Primary open angle glaucoma of right eye, moderate stage  Osteoarthritis  Malignant neoplasm of upper-inner quadrant of left breast in female, estrogen receptor positive (CMS/HHS-HCC)  HTN (hypertension)  Esophageal reflux  Diabetes (CMS/HHS-HCC)   History reviewed. No pertinent surgical history.   Allergies  Allergen Reactions  Oxycodone Hcl Itching and Rash  Sulfa (Sulfonamide Antibiotics) Rash  Rash all over body   Rash all over body  Sulfamethoxazole-Trimethoprim Rash  rash all over   Current Outpatient Medications on File Prior to Visit  Medication Sig Dispense Refill  dorzolamide-timoloL (COSOPT) 22.3-6.8 mg/mL  ophthalmic solution Apply 1 drop to eye 2 (two) times daily  metFORMIN (GLUCOPHAGE) 1000 MG tablet Take 1,000 mg by mouth 2 (two) times daily  PROTONIX 40 mg DR tablet Take 40 mg by mouth as directed 1 tablet Orally Once a day for 30 day(s)   No current facility-administered medications on file prior to visit.   Family History  Family history unknown: Yes    Social History   Tobacco Use  Smoking Status Former  Types: Cigarettes  Smokeless Tobacco Never    Social History   Socioeconomic History  Marital status: Married  Tobacco Use  Smoking status: Former  Types: Cigarettes  Smokeless tobacco: Never  Substance and Sexual Activity  Alcohol use: Not Currently   Social Determinants of Health   Financial Resource Strain: Low Risk (05/25/2023)  Received from [facility]  Overall Financial Resource Strain (CARDIA)  Difficulty of Paying Living Expenses: Not hard at all  Food Insecurity: No Food Insecurity (05/25/2023)  Received from [facility]  Hunger Vital Sign  Worried About Running Out of Food in the Last Year: Never true  Ran Out of Food in the Last Year: Never true  Transportation Needs: No Transportation Needs (05/25/2023)  Received from [facility] - Transportation  Lack of Transportation (Medical): No  Lack of Transportation (Non-Medical): No  Physical Activity: Insufficiently Active (05/25/2023)  Received from [facility]  Exercise Vital Sign  Days of Exercise per Week: 3 days  Minutes of Exercise per Session: 30 min  Stress: No Stress Concern Present (05/25/2023)  Received from [facility] of Occupational  Health - Occupational Stress Questionnaire  Feeling of Stress : Not at all  Social Connections: Unknown (05/25/2023)  Received from [facility]  Social Connection and Isolation Panel [NHANES]  Frequency of Communication with Friends and Family: More than three times a week  Frequency of Social Gatherings with Friends and Family:  Twice a week  Active Member of [employer] or Organizations: Yes  Attends Engineer, structural: More than 4 times per year  Marital Status: Married   Objective:  There were no vitals filed for this visit.  There is no height or weight on file to calculate BMI.  Physical Exam Vitals reviewed.  Constitutional:  General: She is not in acute distress. Appearance: Normal appearance.  HENT:  Head: Normocephalic and atraumatic.  Right Ear: External ear normal.  Left Ear: External ear normal.  Nose: Nose normal.  Mouth/Throat:  Mouth: Mucous membranes are moist.  Pharynx: Oropharynx is clear.  Eyes:  General: No scleral icterus. Extraocular Movements: Extraocular movements intact.  Conjunctiva/sclera: Conjunctivae normal.  Pupils: Pupils are equal, round, and reactive to light.  Cardiovascular:  Rate and Rhythm: Normal rate and regular rhythm.  Pulses: Normal pulses.  Heart sounds: Normal heart sounds.  Pulmonary:  Effort: Pulmonary effort is normal. No respiratory distress.  Breath sounds: Normal breath sounds.  Abdominal:  General: Bowel sounds are normal.  Palpations: Abdomen is soft.  Tenderness: There is no abdominal tenderness.  Musculoskeletal:  General: No swelling, tenderness or deformity. Normal range of motion.  Cervical back: Normal range of motion and neck supple.  Skin: General: Skin is warm and dry.  Coloration: Skin is not jaundiced.  Neurological:  General: No focal deficit present.  Mental Status: She is alert and oriented to person, place, and time.  Psychiatric:  Mood and Affect: Mood normal.  Behavior: Behavior normal.     Breast: There is a small palpable bruise in the upper inner central left breast. Other than this there is no palpable mass in either breast. There is no palpable axillary, supraclavicular, or cervical lymphadenopathy.  Labs, Imaging and Diagnostic Testing:  Assessment and Plan:   Diagnoses and all orders for this  visit:  Malignant neoplasm of upper-inner quadrant of left breast in female, estrogen receptor positive (CMS/HHS-HCC) - [facility] Case Posting Request; Future   The patient appears to have a small 6 mm cancer in the upper inner central left breast with clinically negative nodes and all favorable markers. I have discussed with her in detail the different options for treatment and at this point she favors breast conservation which I feel is very reasonable. She will not need a node evaluation. I have discussed with her in detail the risks and benefits of the operation as well as some of the technical aspects including the use of a radioactive seed for localization and she understands and wishes to proceed. She will meet with medical and radiation oncology to discuss adjuvant therapy as well. We will begin surgical scheduling.

## 2023-09-22 NOTE — Anesthesia Postprocedure Evaluation (Signed)
Anesthesia Post Note  Patient: Monique Bauer  Procedure(s) Performed: LEFT BREAST RADIOACTIVE SEED LOCALIZED LUMPECTOMY (Left)     Patient location during evaluation: PACU Anesthesia Type: General Level of consciousness: awake and alert Pain management: pain level controlled Vital Signs Assessment: post-procedure vital signs reviewed and stable Respiratory status: spontaneous breathing, nonlabored ventilation and respiratory function stable Cardiovascular status: blood pressure returned to baseline and stable Postop Assessment: no apparent nausea or vomiting Anesthetic complications: no  No notable events documented.  Last Vitals:  Vitals:   09/22/23 1330 09/22/23 1345  BP: 135/62 133/65  Pulse: 65 61  Resp: 15 16  Temp:  36.4 C  SpO2: 96% 96%    Last Pain:  Vitals:   09/22/23 1345  TempSrc:   PainSc: 0-No pain                 Mackinley Cassaday,W. EDMOND

## 2023-09-22 NOTE — Anesthesia Preprocedure Evaluation (Addendum)
Anesthesia Evaluation  Patient identified by MRN, date of birth, ID band Patient awake    Reviewed: Allergy & Precautions, H&P , NPO status , Patient's Chart, lab work & pertinent test results  Airway Mallampati: II  TM Distance: >3 FB Neck ROM: Full    Dental no notable dental hx. (+) Teeth Intact, Dental Advisory Given   Pulmonary former smoker   Pulmonary exam normal breath sounds clear to auscultation       Cardiovascular hypertension, Pt. on medications  Rhythm:Regular Rate:Normal     Neuro/Psych   Anxiety     negative neurological ROS     GI/Hepatic Neg liver ROS, hiatal hernia,GERD  Medicated,,  Endo/Other  diabetes, Type 2, Oral Hypoglycemic Agents    Renal/GU negative Renal ROS  negative genitourinary   Musculoskeletal  (+) Arthritis , Osteoarthritis,    Abdominal   Peds  Hematology negative hematology ROS (+)   Anesthesia Other Findings   Reproductive/Obstetrics negative OB ROS                             Anesthesia Physical Anesthesia Plan  ASA: 2  Anesthesia Plan: General   Post-op Pain Management: Tylenol PO (pre-op)* and Gabapentin PO (pre-op)*   Induction: Intravenous  PONV Risk Score and Plan: 4 or greater and Ondansetron, Dexamethasone, Treatment may vary due to age or medical condition, Propofol infusion and TIVA  Airway Management Planned: LMA  Additional Equipment:   Intra-op Plan:   Post-operative Plan: Extubation in OR  Informed Consent: I have reviewed the patients History and Physical, chart, labs and discussed the procedure including the risks, benefits and alternatives for the proposed anesthesia with the patient or authorized representative who has indicated his/her understanding and acceptance.     Dental advisory given  Plan Discussed with: CRNA  Anesthesia Plan Comments:        Anesthesia Quick Evaluation

## 2023-09-22 NOTE — Op Note (Signed)
09/22/2023  1:04 PM  PATIENT:  Monique Bauer  87 y.o. female  PRE-OPERATIVE DIAGNOSIS:  LEFT BREAST CANCER  POST-OPERATIVE DIAGNOSIS:  LEFT BREAST CANCER  PROCEDURE:  Procedure(s): LEFT BREAST RADIOACTIVE SEED LOCALIZED LUMPECTOMY (Left)  SURGEON:  Surgeons and Role:    * Griselda Miner, MD - Primary  PHYSICIAN ASSISTANT:   ASSISTANTS: none   ANESTHESIA:   local and general  EBL:  minimal   BLOOD ADMINISTERED:none  DRAINS: none   LOCAL MEDICATIONS USED:  MARCAINE     SPECIMEN:  Source of Specimen:  left breast tissue  DISPOSITION OF SPECIMEN:  PATHOLOGY  COUNTS:  YES  TOURNIQUET:  * No tourniquets in log *  DICTATION: .Dragon Dictation  After informed consent was obtained the patient was brought to the operating room and placed in the supine position on the operating table.  After adequate induction of general anesthesia the patient's left breast was prepped with ChloraPrep, allowed to dry, and draped in usual sterile manner.  An appropriate timeout was performed.  Previously an I-125 seed was placed in the inner central left breast to mark an area of invasive breast cancer.  The neoprobe was set to I-125 in the area of radioactivity was readily identified just under the areola medially.  The area around this was infiltrated with quarter percent Marcaine.  A curvilinear incision was then made with a 15 blade knife along the inner edge of the areola of the left breast.  The incision was carried through the skin and subcutaneous tissue sharply with the electrocautery.  Dissection was then carried around the radioactive seed while checking the area of radioactivity frequently.  This dissection was very superficial behind the nipple.  Once the tissue was removed it was oriented with the appropriate paint colors.  A specimen radiograph was obtained that showed the clip and seed to be near the center of the specimen.  The specimen was then sent to pathology for further evaluation.   Hemostasis was achieved using the Bovie electrocautery.  The wound was irrigated with saline and infiltrated with more quarter percent Marcaine.  The cavity was marked with clips.  The deep layer of the incision was closed with layers of interrupted 3-0 Vicryl stitches.  The skin was then closed with interrupted 4-0 Monocryl subcuticular stitches.  Dermabond dressings were applied.  The patient tolerated the procedure well.  At the end of the case all needle sponge and instrument counts were correct.  The patient was then awakened and taken recovery in stable condition.  PLAN OF CARE: Discharge to home after PACU  PATIENT DISPOSITION:  PACU - hemodynamically stable.   Delay start of Pharmacological VTE agent (>24hrs) due to surgical blood loss or risk of bleeding: not applicable

## 2023-09-22 NOTE — Interval H&P Note (Signed)
History and Physical Interval Note:  09/22/2023 11:54 AM  Monique Bauer  has presented today for surgery, with the diagnosis of LEFT BREAST CANCER.  The various methods of treatment have been discussed with the patient and family. After consideration of risks, benefits and other options for treatment, the patient has consented to  Procedure(s): LEFT BREAST RADIOACTIVE SEED LOCALIZED LUMPECTOMY (Left) as a surgical intervention.  The patient's history has been reviewed, patient examined, no change in status, stable for surgery.  I have reviewed the patient's chart and labs.  Questions were answered to the patient's satisfaction.     Chevis Pretty III

## 2023-09-22 NOTE — Transfer of Care (Signed)
Immediate Anesthesia Transfer of Care Note  Patient: Monique Bauer Date  Procedure(s) Performed: LEFT BREAST RADIOACTIVE SEED LOCALIZED LUMPECTOMY (Left)  Patient Location: PACU  Anesthesia Type:General  Level of Consciousness: awake, alert , and oriented  Airway & Oxygen Therapy: Patient Spontanous Breathing and Patient connected to face mask oxygen  Post-op Assessment: Report given to RN and Post -op Vital signs reviewed and stable  Post vital signs: Reviewed and stable  Last Vitals:  Vitals Value Taken Time  BP 127/64 09/22/23 1315  Temp    Pulse 71 09/22/23 1321  Resp 15 09/22/23 1321  SpO2 95 % 09/22/23 1321  Vitals shown include unfiled device data.  Last Pain:  Vitals:   09/22/23 1029  TempSrc:   PainSc: 0-No pain      Patients Stated Pain Goal: 0 (09/22/23 1029)  Complications: No notable events documented.

## 2023-09-22 NOTE — Anesthesia Procedure Notes (Signed)
Procedure Name: LMA Insertion Date/Time: 09/22/2023 12:52 PM  Performed by: Heron Sabins, CRNAPre-anesthesia Checklist: Patient identified, Emergency Drugs available, Suction available and Patient being monitored Patient Re-evaluated:Patient Re-evaluated prior to induction Oxygen Delivery Method: Circle System Utilized Preoxygenation: Pre-oxygenation with 100% oxygen Induction Type: IV induction Ventilation: Mask ventilation without difficulty LMA: LMA inserted LMA Size: 4.0 Number of attempts: 1 Airway Equipment and Method: Bite block Placement Confirmation: positive ETCO2 Tube secured with: Tape Dental Injury: Teeth and Oropharynx as per pre-operative assessment

## 2023-09-23 ENCOUNTER — Encounter (HOSPITAL_COMMUNITY): Payer: Self-pay | Admitting: General Surgery

## 2023-09-24 LAB — SURGICAL PATHOLOGY

## 2023-09-27 ENCOUNTER — Encounter: Payer: Self-pay | Admitting: General Practice

## 2023-09-27 NOTE — Progress Notes (Signed)
[facility] Spiritual Care Note  Attempted post-op follow-up call for emotional support and pastoral check-in. Left voicemail encouraging return call as desired.   [address] [place], [place], [facility] Pager [phone] Voicemail [phone]

## 2023-09-28 ENCOUNTER — Encounter: Payer: Self-pay | Admitting: *Deleted

## 2023-10-04 ENCOUNTER — Other Ambulatory Visit (HOSPITAL_BASED_OUTPATIENT_CLINIC_OR_DEPARTMENT_OTHER): Payer: Self-pay

## 2023-10-04 MED ORDER — INFLUENZA VAC A&B SURF ANT ADJ 0.5 ML IM SUSY
0.5000 mL | PREFILLED_SYRINGE | Freq: Once | INTRAMUSCULAR | 0 refills | Status: AC
  Filled 2023-10-04: qty 0.5, 1d supply, fill #0

## 2023-10-05 ENCOUNTER — Encounter: Payer: Self-pay | Admitting: Genetic Counselor

## 2023-10-05 ENCOUNTER — Ambulatory Visit: Payer: Self-pay | Admitting: Genetic Counselor

## 2023-10-05 ENCOUNTER — Encounter: Payer: Self-pay | Admitting: *Deleted

## 2023-10-05 ENCOUNTER — Telehealth: Payer: Self-pay | Admitting: Genetic Counselor

## 2023-10-05 DIAGNOSIS — Z8051 Family history of malignant neoplasm of kidney: Secondary | ICD-10-CM

## 2023-10-05 DIAGNOSIS — Z1379 Encounter for other screening for genetic and chromosomal anomalies: Secondary | ICD-10-CM

## 2023-10-05 DIAGNOSIS — Z803 Family history of malignant neoplasm of breast: Secondary | ICD-10-CM

## 2023-10-05 DIAGNOSIS — Z170 Estrogen receptor positive status [ER+]: Secondary | ICD-10-CM

## 2023-10-05 NOTE — Progress Notes (Signed)
HPI:   Monique Bauer was previously seen in the [facility] due to a personal and family history of breast cancer and concerns regarding a hereditary predisposition to cancer.    Monique Bauer recent genetic test results were disclosed to her by telephone. These results and recommendations are discussed in more detail below.  CANCER HISTORY:  Oncology History  Malignant neoplasm of upper-inner quadrant of left breast in female, estrogen receptor positive (HCC)  08/31/2023 Initial Diagnosis   Screening mammogram detected left breast mass 0.6 cm at 10 o'clock position, axilla negative, biopsy revealed grade 1 IDC with intermediate grade DCIS ER 100%, PR 95%, Ki67 10%, HER2 1+ negative   09/08/2023 Cancer Staging   Staging form: Breast, AJCC 8th Edition - Clinical stage from 09/08/2023: Stage IA (cT1b, cN0, cM0, G1, ER+, PR+, HER2-) - Signed by Serena Croissant, MD on 09/08/2023 Stage prefix: Initial diagnosis Histologic grading system: 3 grade system Laterality: Left Staged by: Pathologist and managing physician National guidelines used in treatment planning: Yes Type of national guideline used in treatment planning: NCCN   09/23/2023 Genetic Testing   Negative [facility] CancerNext-Expanded +RNAinsight Panel.  Report date is 09/23/2023.   The CancerNext-Expanded gene panel offered by [facility] and includes sequencing, rearrangement, and RNA analysis for the following 71 genes:  AIP, ALK, APC, ATM, BAP1, BARD1, BMPR1A, BRCA1, BRCA2, BRIP1, CDC73, CDH1, CDK4, CDKN1B, CDKN2A, CHEK2, DICER1, FH, FLCN, KIF1B, LZTR1,MAX, MEN1, MET, MLH1, MSH2, MSH6, MUTYH, NF1, NF2, NTHL1, PALB2, PHOX2B, PMS2, POT1, PRKAR1A, PTCH1, PTEN, RAD51C,RAD51D, RB1, RET, SDHA, SDHAF2, SDHB, SDHC, SDHD, SMAD4, SMARCA4, SMARCB1, SMARCE1, STK11, SUFU, TMEM127, TP53,TSC1, TSC2 and VHL (sequencing and deletion/duplication); AXIN2, CTNNA1, EGFR, EGLN1, HOXB13, KIT, MITF, MSH3, PDGFRA, POLD1 and POLE (sequencing only);  EPCAM and GREM1 (deletion/duplication only).      FAMILY HISTORY:  We obtained a detailed, 4-generation family history.  Significant diagnoses are listed below: FAMILY HISTORY:  We obtained a detailed, 4-generation family history.  Significant diagnoses are listed below:      Family History  Problem Relation Age of Onset   Breast cancer Paternal Aunt          dx 17s   Kidney cancer Daughter 60   Breast cancer Daughter 40     Monique Bauer is unaware of previous family history of genetic testing for hereditary cancer risks.  There is no reported Ashkenazi Jewish ancestry. There is no known consanguinity.  GENETIC TEST RESULTS:  The [facility] CancerNext-Expanded +RNAinsight Panel found no pathogenic mutations.   The CancerNext-Expanded gene panel offered by [facility] and includes sequencing, rearrangement, and RNA analysis for the following 71 genes:  AIP, ALK, APC, ATM, BAP1, BARD1, BMPR1A, BRCA1, BRCA2, BRIP1, CDC73, CDH1, CDK4, CDKN1B, CDKN2A, CHEK2, DICER1, FH, FLCN, KIF1B, LZTR1,MAX, MEN1, MET, MLH1, MSH2, MSH6, MUTYH, NF1, NF2, NTHL1, PALB2, PHOX2B, PMS2, POT1, PRKAR1A, PTCH1, PTEN, RAD51C,RAD51D, RB1, RET, SDHA, SDHAF2, SDHB, SDHC, SDHD, SMAD4, SMARCA4, SMARCB1, SMARCE1, STK11, SUFU, TMEM127, TP53,TSC1, TSC2 and VHL (sequencing and deletion/duplication); AXIN2, CTNNA1, EGFR, EGLN1, HOXB13, KIT, MITF, MSH3, PDGFRA, POLD1 and POLE (sequencing only); EPCAM and GREM1 (deletion/duplication only).    The test report has been scanned into EPIC and is located under the Molecular Pathology section of the Results Review tab.  A portion of the result report is included below for reference. Genetic testing reported out on September 23, 2023.       Even though a pathogenic variant was not identified, possible explanations for the cancer in the family may include: There  may be no hereditary risk for cancer in the family. The cancers in Monique Bauer and/or her family may be sporadic/familial or due to  other genetic and environmental factors. There may be a gene mutation in one of these genes that current testing methods cannot detect but that chance is small. There could be another gene that has not yet been discovered, or that we have not yet tested, that is responsible for the cancer diagnoses in the family.  It is also possible there is a hereditary cause for the cancer in the family that Monique Bauer did not inherit.   Therefore, it is important to remain in touch with cancer genetics in the future so that we can continue to offer Monique Bauer the most up to date genetic testing.     ADDITIONAL GENETIC TESTING:   Monique Bauer genetic testing was fairly extensive.  If there are additional relevant genes identified to increase cancer risk that can be analyzed in the future, we would be happy to discuss and coordinate this testing at that time.    CANCER SCREENING RECOMMENDATIONS:  Monique Bauer test result is considered negative (normal).  This means that we have not identified a hereditary cause for her personal history of breast cancer at this time.   An individual's cancer risk and medical management are not determined by genetic test results alone. Overall cancer risk assessment incorporates additional factors, including personal medical history, family history, and any available genetic information that may result in a personalized plan for cancer prevention and surveillance. Therefore, it is recommended she continue to follow the cancer management and screening guidelines provided by her oncology and primary healthcare provider.   RECOMMENDATIONS FOR FAMILY MEMBERS:   Since she did not inherit a identifiable mutation in a cancer predisposition gene included on this panel, her children could not have inherited a known mutation from her in one of these genes. Individuals in this family might be at some increased risk of developing cancer, over the general population risk, due to the family  history of cancer.  Individuals in the family should notify their providers of the family history of cancer. We recommend women in this family have a yearly mammogram beginning at age 79, or 36 years younger than the earliest onset of cancer, an annual clinical breast exam, and perform monthly breast self-exams.  Risk models that take into account family history and hormonal history may be helpful in determining appropriate breast cancer screening options for family members.    FOLLOW-UP:  Cancer genetics is a rapidly advancing field and it is possible that new genetic tests will be appropriate for her and/or her family members in the future. We encourage Monique Bauer to remain in contact with cancer genetics, so we can update her personal and family histories and let her know of advances in cancer genetics that may benefit this family.   Our contact number was provided.  They are welcome to call us at anytime with additional questions or concerns.   Krissia Schreier M. Rennie Plowman, MS, [facility] Genetic Counselor [email or web address] (P) [phone]

## 2023-10-05 NOTE — Telephone Encounter (Signed)
Disclosed negative genetics.    

## 2023-10-18 ENCOUNTER — Encounter: Payer: Self-pay | Admitting: *Deleted

## 2023-10-18 ENCOUNTER — Other Ambulatory Visit (HOSPITAL_BASED_OUTPATIENT_CLINIC_OR_DEPARTMENT_OTHER): Payer: Self-pay

## 2023-10-18 ENCOUNTER — Inpatient Hospital Stay: Payer: Medicare Other | Attending: Hematology and Oncology | Admitting: Hematology and Oncology

## 2023-10-18 VITALS — BP 151/64 | HR 81 | Temp 97.8°F | Resp 18 | Ht 60.0 in | Wt 135.9 lb

## 2023-10-18 DIAGNOSIS — Z170 Estrogen receptor positive status [ER+]: Secondary | ICD-10-CM | POA: Insufficient documentation

## 2023-10-18 DIAGNOSIS — Z79899 Other long term (current) drug therapy: Secondary | ICD-10-CM | POA: Insufficient documentation

## 2023-10-18 DIAGNOSIS — C50212 Malignant neoplasm of upper-inner quadrant of left female breast: Secondary | ICD-10-CM | POA: Diagnosis not present

## 2023-10-18 DIAGNOSIS — Z79811 Long term (current) use of aromatase inhibitors: Secondary | ICD-10-CM | POA: Insufficient documentation

## 2023-10-18 MED ORDER — ANASTROZOLE 1 MG PO TABS
1.0000 mg | ORAL_TABLET | Freq: Every day | ORAL | 3 refills | Status: DC
  Filled 2023-10-18: qty 90, 90d supply, fill #0
  Filled 2023-12-31: qty 90, 90d supply, fill #1
  Filled 2024-04-11: qty 90, 90d supply, fill #2
  Filled 2024-06-29: qty 90, 90d supply, fill #3

## 2023-10-18 NOTE — Progress Notes (Signed)
Patient Care Team: Wanda Plump, MD as PCP - General Tyrone Schimke, MD as Consulting Physician (Ophthalmology) Donalee Citrin, MD as Consulting Physician (Neurosurgery) Radionchenko, Lucious Groves, MD as Referring Physician (Ophthalmology) Pershing Proud, RN as Oncology Nurse Navigator Donnelly Angelica, RN as Oncology Nurse Navigator Griselda Miner, MD as Consulting Physician (General Surgery) Antony Blackbird, MD as Consulting Physician (Radiation Oncology) Serena Croissant, MD as Consulting Physician (Hematology and Oncology)  DIAGNOSIS:  Encounter Diagnosis  Name Primary?   Malignant neoplasm of upper-inner quadrant of left breast in female, estrogen receptor positive (HCC) Yes    SUMMARY OF ONCOLOGIC HISTORY: Oncology History  Malignant neoplasm of upper-inner quadrant of left breast in female, estrogen receptor positive (HCC)  08/31/2023 Initial Diagnosis   Screening mammogram detected left breast mass 0.6 cm at 10 o'clock position, axilla negative, biopsy revealed grade 1 IDC with intermediate grade DCIS ER 100%, PR 95%, Ki67 10%, HER2 1+ negative   09/08/2023 Cancer Staging   Staging form: Breast, AJCC 8th Edition - Clinical stage from 09/08/2023: Stage IA (cT1b, cN0, cM0, G1, ER+, PR+, HER2-) - Signed by Serena Croissant, MD on 09/08/2023 Stage prefix: Initial diagnosis Histologic grading system: 3 grade system Laterality: Left Staged by: Pathologist and managing physician National guidelines used in treatment planning: Yes Type of national guideline used in treatment planning: NCCN   09/22/2023 Surgery   Left lumpectomy: Grade 1 IDC 0.9 cm with intermediate grade DCIS, margins negative, negative for lymphovascular perineural invasion, ER 100%, PR 95%, HER2 1+ negative, Ki-67 10%    09/23/2023 Genetic Testing   Negative [facility] CancerNext-Expanded +RNAinsight Panel.  Report date is 09/23/2023.   The CancerNext-Expanded gene panel offered by [facility] and includes sequencing,  rearrangement, and RNA analysis for the following 71 genes:  AIP, ALK, APC, ATM, BAP1, BARD1, BMPR1A, BRCA1, BRCA2, BRIP1, CDC73, CDH1, CDK4, CDKN1B, CDKN2A, CHEK2, DICER1, FH, FLCN, KIF1B, LZTR1,MAX, MEN1, MET, MLH1, MSH2, MSH6, MUTYH, NF1, NF2, NTHL1, PALB2, PHOX2B, PMS2, POT1, PRKAR1A, PTCH1, PTEN, RAD51C,RAD51D, RB1, RET, SDHA, SDHAF2, SDHB, SDHC, SDHD, SMAD4, SMARCA4, SMARCB1, SMARCE1, STK11, SUFU, TMEM127, TP53,TSC1, TSC2 and VHL (sequencing and deletion/duplication); AXIN2, CTNNA1, EGFR, EGLN1, HOXB13, KIT, MITF, MSH3, PDGFRA, POLD1 and POLE (sequencing only); EPCAM and GREM1 (deletion/duplication only).      CHIEF COMPLIANT: Follow-up after surgery for breast cancer  HISTORY OF PRESENT ILLNESS: Discussed the use of AI scribe software for clinical note transcription with the patient, who gave verbal consent to proceed.  History of Present Illness   The patient, with a history of breast cancer, presents for a post-operative follow-up after successful surgery. She reports that the surgery went well and she is pleased with the results. The pathology report revealed a tumor of less than a centimeter, early grade, with some precancerous tissue that was also removed. All margins were negative, indicating that all the cancerous tissue was successfully removed. There was no invasion into the lymph or blood vessel spaces. The cancer was found to be estrogen and progesterone positive, and HER2 negative, with a proliferation rate of only ten percent. The final stage was confirmed as stage 1A.  The patient also has a history of osteoporosis and is currently taking vitamin D. She reports engaging in regular walking for exercise, approximately twenty to thirty minutes five days a week. She is due for a bone density test, which is ordered by her primary care doctor every two years.         ALLERGIES:  is allergic to bactrim [sulfamethoxazole-trimethoprim],  oxycodone hcl, and sulfa  antibiotics.  MEDICATIONS:  Current Outpatient Medications  Medication Sig Dispense Refill   anastrozole (ARIMIDEX) 1 MG tablet Take 1 tablet (1 mg total) by mouth daily. 90 tablet 3   atorvastatin (LIPITOR) 40 MG tablet TAKE 1 TABLET BY MOUTH AT  BEDTIME 90 tablet 3   Calcium Carb-Cholecalciferol (CALCIUM 500+D PO) Take 1 tablet by mouth 2 (two) times daily.     Cyanocobalamin (B-12) 5000 MCG CAPS Take 1 tablet by mouth daily.     diphenhydramine-acetaminophen (TYLENOL PM) 25-500 MG TABS tablet Take 1 tablet by mouth at bedtime.     dorzolamide-timolol (COSOPT) 2-0.5 % ophthalmic solution Place 1 drop into both eyes 2 (two) times daily. 10 mL 11   GLUCOSAMINE-CHONDROITIN PO Take 1 tablet by mouth daily.     glucose blood (ONETOUCH VERIO) test strip CHECK BLOOD SUGAR DAILY 100 strip 3   Lancets (ONETOUCH DELICA PLUS LANCET30G) MISC CHECK BLOOD SUGAR ONCE  DAILY 100 each 12   lisinopril-hydrochlorothiazide (ZESTORETIC) 10-12.5 MG tablet TAKE 2 TABLETS BY MOUTH DAILY 180 tablet 3   metFORMIN (GLUCOPHAGE) 1000 MG tablet Take 1.5 tablets (1,500 mg total) by mouth with breakfast, with lunch, and with evening meal. 45 tablet 0   Multiple Vitamin (MULTIVITAMIN WITH MINERALS) TABS tablet Take 1 tablet by mouth daily.     pantoprazole (PROTONIX) 40 MG tablet TAKE 1 TABLET BY MOUTH DAILY 90 tablet 3   traMADol (ULTRAM) 50 MG tablet Take 1 tablet (50 mg total) by mouth every 6 (six) hours as needed. 15 tablet 0   TURMERIC CURCUMIN PO Take 2 each by mouth daily.     No current facility-administered medications for this visit.    PHYSICAL EXAMINATION: ECOG PERFORMANCE STATUS: 1 - Symptomatic but completely ambulatory  Vitals:   10/18/23 1426  BP: (!) 151/64  Pulse: 81  Resp: 18  Temp: 97.8 F (36.6 C)  SpO2: 99%   Filed Weights   10/18/23 1426  Weight: 135 lb 14.4 oz (61.6 kg)      LABORATORY DATA:  I have reviewed the data as listed    Latest Ref Rng & Units 09/08/2023   12:11 PM  08/04/2023    9:52 AM 10/28/2022    8:18 AM  CMP  Glucose 70 - 99 mg/dL 638  756  433   BUN 8 - 23 mg/dL 23  23  30    Creatinine 0.44 - 1.00 mg/dL 2.95  1.88  4.16   Sodium 135 - 145 mmol/L 142  141  140   Potassium 3.5 - 5.1 mmol/L 3.8  4.1  4.2   Chloride 98 - 111 mmol/L 106  103  102   CO2 22 - 32 mmol/L 26  28  31    Calcium 8.9 - 10.3 mg/dL 9.9  9.9  9.5   Total Protein 6.5 - 8.1 g/dL 7.9  7.4    Total Bilirubin 0.3 - 1.2 mg/dL 0.4  0.5    Alkaline Phos 38 - 126 U/L 107  92    AST 15 - 41 U/L 23  23    ALT 0 - 44 U/L 23  24      Lab Results  Component Value Date   WBC 8.9 09/08/2023   HGB 12.8 09/08/2023   HCT 39.1 09/08/2023   MCV 86.5 09/08/2023   PLT 299 09/08/2023   NEUTROABS 4.9 09/08/2023    ASSESSMENT & PLAN:  Malignant neoplasm of upper-inner quadrant of left breast in female, estrogen  receptor positive (HCC) 08/31/2023:Screening mammogram detected left breast mass 0.6 cm at 10 o'clock position, axilla negative, biopsy revealed grade 1 IDC with intermediate grade DCIS ER 100%, PR 95%, Ki67 10%, HER2 1+ negative   09/22/2023: Left lumpectomy: Grade 1 IDC 0.9 cm with intermediate grade DCIS, margins negative, negative for lymphovascular perineural invasion, ER 100%, PR 95%, HER2 1+ negative, Ki-67 10%  Pathology counseling: I discussed the final pathology report of the patient provided  a copy of this report. I discussed the margins as well as lymph node surgeries. We also discussed the final staging along with previously performed ER/PR and HER-2/neu testing.  Treatment plan: Adjuvant antiestrogen therapy with anastrozole 1 mg daily x 5 years Anastrozole counseling: We discussed the risks and benefits of anti-estrogen therapy with aromatase inhibitors. These include but not limited to insomnia, hot flashes, mood changes, vaginal dryness, bone density loss, and weight gain. We strongly believe that the benefits far outweigh the risks. Patient understands these risks and  consented to starting treatment. Planned treatment duration is 5 years.  Patient's daughter is also my patient Return to clinic in 3 months for survivorship care plan visit   No orders of the defined types were placed in this encounter.  The patient has a good understanding of the overall plan. she agrees with it. she will call with any problems that may develop before the next visit here. Total time spent: 30 mins including face to face time and time spent for planning, charting and co-ordination of care   Tamsen Meek, MD 10/18/23

## 2023-10-18 NOTE — Assessment & Plan Note (Signed)
08/31/2023:Screening mammogram detected left breast mass 0.6 cm at 10 o'clock position, axilla negative, biopsy revealed grade 1 IDC with intermediate grade DCIS ER 100%, PR 95%, Ki67 10%, HER2 1+ negative   09/22/2023: Left lumpectomy: Grade 1 IDC 0.9 cm with intermediate grade DCIS, margins negative, negative for lymphovascular perineural invasion, ER 100%, PR 95%, HER2 1+ negative, Ki-67 10%  Pathology counseling: I discussed the final pathology report of the patient provided  a copy of this report. I discussed the margins as well as lymph node surgeries. We also discussed the final staging along with previously performed ER/PR and HER-2/neu testing.  Treatment plan: Adjuvant antiestrogen therapy with anastrozole 1 mg daily x 5 years Anastrozole counseling: We discussed the risks and benefits of anti-estrogen therapy with aromatase inhibitors. These include but not limited to insomnia, hot flashes, mood changes, vaginal dryness, bone density loss, and weight gain. We strongly believe that the benefits far outweigh the risks. Patient understands these risks and consented to starting treatment. Planned treatment duration is 5 years.  Patient's daughter is also my patient Return to clinic in 3 months for survivorship care plan visit

## 2023-10-19 ENCOUNTER — Other Ambulatory Visit (HOSPITAL_BASED_OUTPATIENT_CLINIC_OR_DEPARTMENT_OTHER): Payer: Self-pay

## 2023-10-19 MED ORDER — COVID-19 MRNA VAC-TRIS(PFIZER) 30 MCG/0.3ML IM SUSY
0.3000 mL | PREFILLED_SYRINGE | Freq: Once | INTRAMUSCULAR | 0 refills | Status: AC
  Filled 2023-10-19: qty 0.3, 1d supply, fill #0

## 2023-11-03 ENCOUNTER — Ambulatory Visit (INDEPENDENT_AMBULATORY_CARE_PROVIDER_SITE_OTHER): Payer: Medicare Other | Admitting: Internal Medicine

## 2023-11-03 ENCOUNTER — Encounter: Payer: Self-pay | Admitting: Internal Medicine

## 2023-11-03 VITALS — BP 132/68 | HR 67 | Temp 98.2°F | Resp 16 | Ht 60.0 in | Wt 133.5 lb

## 2023-11-03 DIAGNOSIS — E119 Type 2 diabetes mellitus without complications: Secondary | ICD-10-CM

## 2023-11-03 DIAGNOSIS — Z780 Asymptomatic menopausal state: Secondary | ICD-10-CM | POA: Diagnosis not present

## 2023-11-03 DIAGNOSIS — E785 Hyperlipidemia, unspecified: Secondary | ICD-10-CM | POA: Diagnosis not present

## 2023-11-03 DIAGNOSIS — M150 Primary generalized (osteo)arthritis: Secondary | ICD-10-CM

## 2023-11-03 DIAGNOSIS — Z170 Estrogen receptor positive status [ER+]: Secondary | ICD-10-CM

## 2023-11-03 DIAGNOSIS — I10 Essential (primary) hypertension: Secondary | ICD-10-CM

## 2023-11-03 DIAGNOSIS — Z7984 Long term (current) use of oral hypoglycemic drugs: Secondary | ICD-10-CM

## 2023-11-03 LAB — MICROALBUMIN / CREATININE URINE RATIO
Creatinine,U: 128.3 mg/dL
Microalb Creat Ratio: 1 mg/g (ref 0.0–30.0)
Microalb, Ur: 1.2 mg/dL (ref 0.0–1.9)

## 2023-11-03 LAB — BASIC METABOLIC PANEL
BUN: 26 mg/dL — ABNORMAL HIGH (ref 6–23)
CO2: 27 meq/L (ref 19–32)
Calcium: 9.6 mg/dL (ref 8.4–10.5)
Chloride: 104 meq/L (ref 96–112)
Creatinine, Ser: 1.16 mg/dL (ref 0.40–1.20)
GFR: 43.13 mL/min — ABNORMAL LOW (ref >60.00)
Glucose, Bld: 109 mg/dL — ABNORMAL HIGH (ref 70–99)
Potassium: 3.8 meq/L (ref 3.5–5.1)
Sodium: 139 meq/L (ref 135–145)

## 2023-11-03 LAB — HEMOGLOBIN A1C: Hgb A1c MFr Bld: 7 % — ABNORMAL HIGH (ref 4.6–6.5)

## 2023-11-03 NOTE — Assessment & Plan Note (Signed)
Breast cancer: Dx 2 months ago, had a lumpectomy, now on anastrozole.  Feeling well physically and emotionally. Diabetes: On metformin, ambulatory CBGs between 85 and 95.  Check A1c and micro. HTN: Currently on lisinopril HCT, ambulatory BPs when checked WNL.  Creatinine has increased a little per chart review, recheck a BMP.  Further advised for results. Osteopenia: Per DEXA 2017, DEXA 2021 was WNL.  Now on anastrozole.  Recommend calcium, vitamin D, check DEXA. DJD, arthralgias: See LOV, workup negative, symptoms are actually improved Preventive care: Up-to-date on vaccines, RSV is a consideration RTC 4 months

## 2023-11-03 NOTE — Patient Instructions (Signed)
Drink plenty of water. Avoid over-the-counter medications such as ibuprofen or naproxen.   Continue checking your blood pressure regularly Blood pressure goal:  between 110/65 and  135/85. If it is consistently higher or lower, let me know     GO TO THE LAB : Get the blood work     Next visit with me in 4 months Please schedule it at the front desk

## 2023-11-03 NOTE — Progress Notes (Signed)
Subjective:    Patient ID: Monique Bauer, female    DOB: Jun 20, 1936, 87 y.o.   MRN: [id number]  DOS:  11/03/2023 Type of visit - description: rov  Routine checkup, since LOV she was diagnosed with breast cancer. Fortunately is doing well physically and emotionally. Ambulatory BP are WNL, blood sugars range from 80-95.  Review of Systems See above   Past Medical History:  Diagnosis Date   Allergy 1960   Anxiety    Cervical myelopathy (HCC)    Diabetes (HCC) 12/2012   Family history of adverse reaction to anesthesia    had problems waking up pt father    GERD (gastroesophageal reflux disease)    Glaucoma    History of hiatal hernia    Hyperlipidemia    Hypertension    Osteoarthritis    Osteopenia    Spiradenoma     Past Surgical History:  Procedure Laterality Date   ANTERIOR CERVICAL DECOMP/DISCECTOMY FUSION N/A 05/17/2017   Procedure: C4-5/5-6 ACDF;  Surgeon: Donalee Citrin, MD;  Location: [facility] OR;  Service: Neurosurgery;  Laterality: N/A;   BREAST LUMPECTOMY WITH RADIOACTIVE SEED LOCALIZATION Left 09/22/2023   Procedure: LEFT BREAST RADIOACTIVE SEED LOCALIZED LUMPECTOMY;  Surgeon: Griselda Miner, MD;  Location: [facility] OR;  Service: General;  Laterality: Left;   CARPAL TUNNEL RELEASE Right 07/2017   CATARACT EXTRACTION Bilateral 2021   SPINE SURGERY  05/17/2017    Current Outpatient Medications  Medication Instructions   anastrozole (ARIMIDEX) 1 mg, Oral, Daily   atorvastatin (LIPITOR) 40 mg, Oral, Daily at bedtime   Calcium Carb-Cholecalciferol (CALCIUM 500+D PO) 1 tablet, Oral, 2 times daily   Cyanocobalamin (B-12) 5000 MCG CAPS 1 tablet, Oral, Daily   diphenhydramine-acetaminophen (TYLENOL PM) 25-500 MG TABS tablet 1 tablet, Oral, Daily at bedtime   dorzolamide-timolol (COSOPT) 2-0.5 % ophthalmic solution 1 drop, Both Eyes, 2 times daily   GLUCOSAMINE-CHONDROITIN PO 1 tablet, Oral, Daily   glucose blood (ONETOUCH VERIO) test strip CHECK BLOOD SUGAR DAILY   Lancets  (ONETOUCH DELICA PLUS LANCET30G) MISC CHECK BLOOD SUGAR ONCE  DAILY   lisinopril-hydrochlorothiazide (ZESTORETIC) 10-12.5 MG tablet 2 tablets, Oral, Daily   metFORMIN (GLUCOPHAGE) 1,500 mg, Oral, 3 times daily with meals   Multiple Vitamin (MULTIVITAMIN WITH MINERALS) TABS tablet 1 tablet, Oral, Daily   pantoprazole (PROTONIX) 40 MG tablet TAKE 1 TABLET BY MOUTH DAILY   traMADol (ULTRAM) 50 mg, Oral, Every 6 hours PRN   TURMERIC CURCUMIN PO 2 each, Oral, Daily       Objective:   Physical Exam BP 132/68   Pulse 67   Temp 98.2 F (36.8 C) (Oral)   Resp 16   Ht 5' (1.524 m)   Wt 133 lb 8 oz (60.6 kg)   SpO2 97%   BMI 26.07 kg/m  General:   Well developed, NAD, BMI noted. HEENT:  Normocephalic . Face symmetric, atraumatic Lungs:  CTA B Normal respiratory effort, no intercostal retractions, no accessory muscle use. Heart: RRR,  no murmur.  Lower extremities: no pretibial edema bilaterally  Skin: Not pale. Not jaundice Neurologic:  alert & oriented X3.  Speech normal, gait appropriate for age and unassisted Psych--  Cognition and judgment appear intact.  Cooperative with normal attention span and concentration.  Behavior appropriate. No anxious or depressed appearing.      Assessment     Assessment Diabetes 2014 HTN Hyperlipidemia Anxiety DJD GERD Osteopenia; dexa 07-2016, 2021 wnl.   Ao [place] (-)  AAA 2010 Cervical myelopathy, s/p surgery  05-2017 CTS s/p R surgery 2018 Glaucoma, early macular degeneration Breast cancer: Dx 08-2023, L lumpectomy, anastrozole  PLAN: Breast cancer: Dx 2 months ago, had a lumpectomy, now on anastrozole.  Feeling well physically and emotionally. Diabetes: On metformin, ambulatory CBGs between 85 and 95.  Check A1c and micro. HTN: Currently on lisinopril HCT, ambulatory BPs when checked WNL.  Creatinine has increased a little per chart review, recheck a BMP.  Further advised for results. Osteopenia: Per DEXA 2017, DEXA 2021 was WNL.  Now  on anastrozole.  Recommend calcium, vitamin D, check DEXA. DJD, arthralgias: See LOV, workup negative, symptoms are actually improved Preventive care: Up-to-date on vaccines, RSV is a consideration RTC 4 months

## 2023-11-14 ENCOUNTER — Other Ambulatory Visit: Payer: Self-pay | Admitting: Internal Medicine

## 2023-11-25 ENCOUNTER — Other Ambulatory Visit: Payer: Self-pay | Admitting: Internal Medicine

## 2023-12-01 LAB — HM DIABETES EYE EXAM

## 2023-12-03 ENCOUNTER — Other Ambulatory Visit (HOSPITAL_BASED_OUTPATIENT_CLINIC_OR_DEPARTMENT_OTHER): Payer: Self-pay

## 2023-12-06 ENCOUNTER — Other Ambulatory Visit (HOSPITAL_BASED_OUTPATIENT_CLINIC_OR_DEPARTMENT_OTHER): Payer: Self-pay

## 2023-12-14 LAB — HM DEXA SCAN: HM Dexa Scan: NORMAL

## 2023-12-16 ENCOUNTER — Encounter: Payer: Self-pay | Admitting: Internal Medicine

## 2024-01-18 ENCOUNTER — Other Ambulatory Visit: Payer: Self-pay | Admitting: Internal Medicine

## 2024-01-24 ENCOUNTER — Encounter: Payer: Self-pay | Admitting: *Deleted

## 2024-01-25 ENCOUNTER — Encounter: Payer: Self-pay | Admitting: Adult Health

## 2024-01-25 ENCOUNTER — Inpatient Hospital Stay: Payer: Medicare Other | Attending: Hematology and Oncology | Admitting: Adult Health

## 2024-01-25 VITALS — BP 161/71 | HR 66 | Temp 97.0°F | Resp 18 | Wt 135.6 lb

## 2024-01-25 DIAGNOSIS — Z1721 Progesterone receptor positive status: Secondary | ICD-10-CM | POA: Insufficient documentation

## 2024-01-25 DIAGNOSIS — Z79899 Other long term (current) drug therapy: Secondary | ICD-10-CM | POA: Diagnosis not present

## 2024-01-25 DIAGNOSIS — Z87891 Personal history of nicotine dependence: Secondary | ICD-10-CM | POA: Diagnosis not present

## 2024-01-25 DIAGNOSIS — Z79811 Long term (current) use of aromatase inhibitors: Secondary | ICD-10-CM | POA: Diagnosis not present

## 2024-01-25 DIAGNOSIS — Z1731 Human epidermal growth factor receptor 2 positive status: Secondary | ICD-10-CM | POA: Diagnosis not present

## 2024-01-25 DIAGNOSIS — Z170 Estrogen receptor positive status [ER+]: Secondary | ICD-10-CM | POA: Diagnosis not present

## 2024-01-25 DIAGNOSIS — C50212 Malignant neoplasm of upper-inner quadrant of left female breast: Secondary | ICD-10-CM | POA: Insufficient documentation

## 2024-01-25 NOTE — Progress Notes (Unsigned)
SURVIVORSHIP VISIT:  BRIEF ONCOLOGIC HISTORY:  Oncology History  Malignant neoplasm of upper-inner quadrant of left breast in female, estrogen receptor positive (HCC)  08/31/2023 Initial Diagnosis   Screening mammogram detected left breast mass 0.6 cm at 10 o'clock position, axilla negative, biopsy revealed grade 1 IDC with intermediate grade DCIS ER 100%, PR 95%, Ki67 10%, HER2 1+ negative   09/08/2023 Cancer Staging   Staging form: Breast, AJCC 8th Edition - Clinical stage from 09/08/2023: Stage IA (cT1b, cN0, cM0, G1, ER+, PR+, HER2-) - Signed by Serena Croissant, MD on 09/08/2023 Stage prefix: Initial diagnosis Histologic grading system: 3 grade system Laterality: Left Staged by: Pathologist and managing physician National guidelines used in treatment planning: Yes Type of national guideline used in treatment planning: NCCN   09/22/2023 Surgery   Left lumpectomy: Grade 1 IDC 0.9 cm with intermediate grade DCIS, margins negative, negative for lymphovascular perineural invasion, ER 100%, PR 95%, HER2 1+ negative, Ki-67 10%    09/23/2023 Genetic Testing   Negative [facility] CancerNext-Expanded +RNAinsight Panel.  Report date is 09/23/2023.   The CancerNext-Expanded gene panel offered by [facility] and includes sequencing, rearrangement, and RNA analysis for the following 71 genes:  AIP, ALK, APC, ATM, BAP1, BARD1, BMPR1A, BRCA1, BRCA2, BRIP1, CDC73, CDH1, CDK4, CDKN1B, CDKN2A, CHEK2, DICER1, FH, FLCN, KIF1B, LZTR1,MAX, MEN1, MET, MLH1, MSH2, MSH6, MUTYH, NF1, NF2, NTHL1, PALB2, PHOX2B, PMS2, POT1, PRKAR1A, PTCH1, PTEN, RAD51C,RAD51D, RB1, RET, SDHA, SDHAF2, SDHB, SDHC, SDHD, SMAD4, SMARCA4, SMARCB1, SMARCE1, STK11, SUFU, TMEM127, TP53,TSC1, TSC2 and VHL (sequencing and deletion/duplication); AXIN2, CTNNA1, EGFR, EGLN1, HOXB13, KIT, MITF, MSH3, PDGFRA, POLD1 and POLE (sequencing only); EPCAM and GREM1 (deletion/duplication only).    10/2023 -  Anti-estrogen oral therapy   1 mg Anastrozole x 5  years     INTERVAL HISTORY:  Ms. Schlicker to review her survivorship care plan detailing her treatment course for breast cancer, as well as monitoring long-term side effects of that treatment, education regarding health maintenance, screening, and overall wellness and health promotion.  She is taking Anastrozole daily with good tolerance.   REVIEW OF SYSTEMS:  Review of Systems  Constitutional:  Negative for appetite change, chills, fatigue, fever and unexpected weight change.  HENT:   Negative for hearing loss, lump/mass and trouble swallowing.   Eyes:  Negative for eye problems and icterus.  Respiratory:  Negative for chest tightness, cough and shortness of breath.   Cardiovascular:  Negative for chest pain, leg swelling and palpitations.  Gastrointestinal:  Negative for abdominal distention, abdominal pain, constipation, diarrhea, nausea and vomiting.  Endocrine: Negative for hot flashes.  Genitourinary:  Negative for difficulty urinating.   Musculoskeletal:  Negative for arthralgias.  Skin:  Negative for itching and rash.  Neurological:  Negative for dizziness, extremity weakness, headaches and numbness.  Hematological:  Negative for adenopathy. Does not bruise/bleed easily.  Psychiatric/Behavioral:  Negative for depression. The patient is not nervous/anxious.    Breast: Denies any new nodularity, masses, tenderness, nipple changes, or nipple discharge.       PAST MEDICAL/SURGICAL HISTORY:  Past Medical History:  Diagnosis Date   Allergy 1960   Anxiety    Cervical myelopathy (HCC)    Diabetes (HCC) 12/2012   Family history of adverse reaction to anesthesia    had problems waking up pt father    GERD (gastroesophageal reflux disease)    Glaucoma    History of hiatal hernia    Hyperlipidemia    Hypertension    Osteoarthritis    Osteopenia  Spiradenoma    Past Surgical History:  Procedure Laterality Date   ANTERIOR CERVICAL DECOMP/DISCECTOMY FUSION N/A 05/17/2017    Procedure: C4-5/5-6 ACDF;  Surgeon: Donalee Citrin, MD;  Location: [facility] OR;  Service: Neurosurgery;  Laterality: N/A;   BREAST LUMPECTOMY WITH RADIOACTIVE SEED LOCALIZATION Left 09/22/2023   Procedure: LEFT BREAST RADIOACTIVE SEED LOCALIZED LUMPECTOMY;  Surgeon: Chevis Pretty III, MD;  Location: [facility] OR;  Service: General;  Laterality: Left;   CARPAL TUNNEL RELEASE Right 07/2017   CATARACT EXTRACTION Bilateral 2021   SPINE SURGERY  05/17/2017     ALLERGIES:  Allergies  Allergen Reactions   Bactrim [Sulfamethoxazole-Trimethoprim] Rash    rash all over   Oxycodone Hcl Itching and Rash   Sulfa Antibiotics Rash    Rash all over body      CURRENT MEDICATIONS:  Outpatient Encounter Medications as of 01/25/2024  Medication Sig   anastrozole (ARIMIDEX) 1 MG tablet Take 1 tablet (1 mg total) by mouth daily.   atorvastatin (LIPITOR) 40 MG tablet Take 1 tablet (40 mg total) by mouth at bedtime.   Calcium Carb-Cholecalciferol (CALCIUM 500+D PO) Take 1 tablet by mouth 2 (two) times daily.   Cyanocobalamin (B-12) 5000 MCG CAPS Take 1 tablet by mouth daily.   dorzolamide-timolol (COSOPT) 2-0.5 % ophthalmic solution Place 1 drop into both eyes 2 (two) times daily.   GLUCOSAMINE-CHONDROITIN PO Take 1 tablet by mouth daily.   glucose blood (ONETOUCH VERIO) test strip CHECK BLOOD SUGAR DAILY   Lancets (ONETOUCH DELICA PLUS LANCET30G) MISC CHECK BLOOD SUGAR ONCE  DAILY   lisinopril-hydrochlorothiazide (ZESTORETIC) 10-12.5 MG tablet Take 2 tablets by mouth daily.   metFORMIN (GLUCOPHAGE) 1000 MG tablet Take 0.5 tablets (500 mg total) by mouth 3 (three) times daily with meals.   Multiple Vitamin (MULTIVITAMIN WITH MINERALS) TABS tablet Take 1 tablet by mouth daily.   pantoprazole (PROTONIX) 40 MG tablet Take 1 tablet (40 mg total) by mouth daily.   TURMERIC CURCUMIN PO Take 2 each by mouth daily.   Calcium-Magnesium-Vitamin D 500-250-125 MG-MG-UNIT TABS 1 tablet Orally Twice a day for 30 day(s)    diphenhydramine-acetaminophen (TYLENOL PM) 25-500 MG TABS tablet Take 1 tablet by mouth at bedtime. (Patient not taking: Reported on 01/25/2024)   Influenza vac split quadrivalent PF (FLUZONE HIGH-DOSE) 0.5 ML injection Inject 0.5 mLs into the muscle.   No facility-administered encounter medications on file as of 01/25/2024.     ONCOLOGIC FAMILY HISTORY:  Family History  Problem Relation Age of Onset   Coronary artery disease Mother        M at age 79 and F   Diabetes Mother        M, F, daughter, sister   Hypertension Mother    Arthritis Mother    Early death Mother    Varicose Veins Mother    Stroke Father    Diabetes Father    Asthma Sister    Diabetes Sister    Breast cancer Paternal Aunt        dx 35s   Kidney cancer Daughter        67   Diabetes Daughter    Hypertension Daughter    Breast cancer Daughter 77   Hypertension Daughter    Colon cancer Neg Hx      SOCIAL HISTORY:  Social History   Socioeconomic History   Marital status: Married    Spouse name: Richarda Overlie   Number of children: 3   Years of education: Not on file   Highest  education level: Some college, no degree  Occupational History   Occupation: retired     Associate Professor: RETIRED  Tobacco Use   Smoking status: Former    Current packs/day: 0.00    Average packs/day: 0.3 packs/day for 30.0 years (7.5 ttl pk-yrs)    Types: Cigarettes    Start date: 10/16/1951    Quit date: 10/15/1981    Years since quitting: 42.3   Smokeless tobacco: Never   Tobacco comments:    was a social smoker  Vaping Use   Vaping status: Never Used  Substance and Sexual Activity   Alcohol use: Yes    Alcohol/week: 1.0 standard drink of alcohol    Types: 1 Glasses of wine per week    Comment: Socially and only occasionally   Drug use: No   Sexual activity: Not Currently  Other Topics Concern   Not on file  Social History Narrative   Lives w/ husband (married x 3);  3 daughters live in [place], 5 G-children, 2 GG son    Social  Drivers of Corporate investment banker Strain: Low Risk  (10/28/2023)   Overall Financial Resource Strain (CARDIA)    Difficulty of Paying Living Expenses: Not hard at all  Food Insecurity: No Food Insecurity (10/28/2023)   Hunger Vital Sign    Worried About Running Out of Food in the Last Year: Never true    Ran Out of Food in the Last Year: Never true  Transportation Needs: No Transportation Needs (10/28/2023)   PRAPARE - Administrator, Civil Service (Medical): No    Lack of Transportation (Non-Medical): No  Physical Activity: Insufficiently Active (10/28/2023)   Exercise Vital Sign    Days of Exercise per Week: 3 days    Minutes of Exercise per Session: 30 min  Stress: No Stress Concern Present (10/28/2023)   [employer] of Occupational Health - Occupational Stress Questionnaire    Feeling of Stress : Only a little  Social Connections: Socially Integrated (10/28/2023)   Social Connection and Isolation Panel [NHANES]    Frequency of Communication with Friends and Family: More than three times a week    Frequency of Social Gatherings with Friends and Family: Twice a week    Attends Religious Services: More than 4 times per year    Active Member of [employer] or Organizations: Yes    Attends Engineer, structural: More than 4 times per year    Marital Status: Married  Catering manager Violence: Not At Risk (09/08/2023)   Humiliation, Afraid, Rape, and Kick questionnaire    Fear of Current or Ex-Partner: No    Emotionally Abused: No    Physically Abused: No    Sexually Abused: No     OBSERVATIONS/OBJECTIVE:  BP (!) 161/71 (BP Location: Left Arm, Patient Position: Sitting) Comment: patient stated she was in a hurry to get here.. 1st BP 161/71 L side; 2nd Bp 181/65 r side  Pulse 66   Temp (!) 97 F (36.1 C) (Temporal)   Resp 18   Wt 135 lb 9.6 oz (61.5 kg)   SpO2 100%   BMI 26.48 kg/m  GENERAL: Patient is a well appearing female in no acute  distress HEENT:  Sclerae anicteric.  Oropharynx clear and moist. No ulcerations or evidence of oropharyngeal candidiasis. Neck is supple.  NODES:  No cervical, supraclavicular, or axillary lymphadenopathy palpated.  BREAST EXAM:  Deferred. LUNGS:  Clear to auscultation bilaterally.  No wheezes or rhonchi. HEART:  Regular rate  and rhythm. No murmur appreciated. ABDOMEN:  Soft, nontender.  Positive, normoactive bowel sounds. No organomegaly palpated. MSK:  No focal spinal tenderness to palpation. Full range of motion bilaterally in the upper extremities. EXTREMITIES:  No peripheral edema.   SKIN:  Clear with no obvious rashes or skin changes. No nail dyscrasia. NEURO:  Nonfocal. Well oriented.  Appropriate affect.   LABORATORY DATA:  None for this visit.  DIAGNOSTIC IMAGING:  None for this visit.      ASSESSMENT AND PLAN:  Ms.. Bauer is a pleasant 88 y.o. female with Stage IA left breast invasive ductal carcinoma, ER+/PR+/HER2-, diagnosed in 08/2023, treated with lumpectomy and anti-estrogen therapy with Anastrozole beginning in 10/2023.  She presents to the Survivorship Clinic for our initial meeting and routine follow-up post-completion of treatment for breast cancer.    1. Stage IA eft breast cancer:  Ms. Broman is continuing to recover from definitive treatment for breast cancer. She will follow-up with her medical oncologist, Dr.  Magnus Ivan in 6 months with history and physical exam per surveillance protocol.  She will continue her anti-estrogen therapy with Anastrozole. Thus far, she is tolerating the Anastrozole  well, with minimal side effects. Her mammogram is due 08/2024; orders placed today.   Today, a comprehensive survivorship care plan and treatment summary was reviewed with the patient today detailing her breast cancer diagnosis, treatment course, potential late/long-term effects of treatment, appropriate follow-up care with recommendations for the future, and patient education  resources.  A copy of this summary, along with a letter will be sent to the patient's primary care provider via mail/fax/In Basket message after today's visit.    2. Bone health:  Given Ms. Cloward's age/history of breast cancer and her current treatment regimen including anti-estrogen therapy with Anastrozole, she is at risk for bone demineralization.  Her last DEXA scan was 11/2023 and was normal.  Repeat is recommended in 11/2025. She was given education on specific activities to promote bone health.  3. Cancer screening:  Due to Ms. Hissong's history and her age, she should receive screening for skin cancers, colon cancer, and gynecologic cancers.  The information and recommendations are listed on the patient's comprehensive care plan/treatment summary and were reviewed in detail with the patient.    4. Health maintenance and wellness promotion: Ms. Stankiewicz was encouraged to consume 5-7 servings of fruits and vegetables per day. We reviewed the "Nutrition Rainbow" handout.  She was also encouraged to engage in moderate to vigorous exercise for 30 minutes per day most days of the week.  She was instructed to limit her alcohol consumption and continue to abstain from tobacco use.     5. Support services/counseling: It is not uncommon for this period of the patient's cancer care trajectory to be one of many emotions and stressors.   She was given information regarding our available services and encouraged to contact me with any questions or for help enrolling in any of our support group/programs.    Follow up instructions:    -Return to cancer center in 6 months for f/u with Dr. Pamelia Hoit  -Mammogram due in 08/2024 -She is welcome to return back to the Survivorship Clinic at any time; no additional follow-up needed at this time.  -Consider referral back to survivorship as a long-term survivor for continued surveillance  The patient was provided an opportunity to ask questions and all were answered. The  patient agreed with the plan and demonstrated an understanding of the instructions.   Total encounter time:30  minutes*in face-to-face visit time, chart review, lab review, care coordination, order entry, and documentation of the encounter time.    Lillard Anes, NP 01/25/24 11:47 AM Medical Oncology and Hematology [facility] [address] [place], [place] [address] Tel. [phone]    Fax. [phone]  *Total Encounter Time as defined by the Centers for Medicare and Medicaid Services includes, in addition to the face-to-face time of a patient visit (documented in the note above) non-face-to-face time: obtaining and reviewing outside history, ordering and reviewing medications, tests or procedures, care coordination (communications with other health care professionals or caregivers) and documentation in the medical record.

## 2024-01-26 ENCOUNTER — Telehealth: Payer: Self-pay | Admitting: Hematology and Oncology

## 2024-01-26 NOTE — Telephone Encounter (Signed)
Scheduled appointment per 2/11 los. Patient is aware of the made appointment.

## 2024-02-29 ENCOUNTER — Encounter: Payer: Self-pay | Admitting: Internal Medicine

## 2024-03-01 ENCOUNTER — Ambulatory Visit (INDEPENDENT_AMBULATORY_CARE_PROVIDER_SITE_OTHER): Payer: Medicare Other | Admitting: Internal Medicine

## 2024-03-01 ENCOUNTER — Encounter: Payer: Self-pay | Admitting: Internal Medicine

## 2024-03-01 VITALS — BP 130/90 | HR 68 | Temp 97.8°F | Resp 16 | Ht 60.0 in | Wt 132.0 lb

## 2024-03-01 DIAGNOSIS — E119 Type 2 diabetes mellitus without complications: Secondary | ICD-10-CM

## 2024-03-01 DIAGNOSIS — Z7984 Long term (current) use of oral hypoglycemic drugs: Secondary | ICD-10-CM

## 2024-03-01 DIAGNOSIS — I10 Essential (primary) hypertension: Secondary | ICD-10-CM

## 2024-03-01 LAB — BASIC METABOLIC PANEL
BUN: 27 mg/dL — ABNORMAL HIGH (ref 6–23)
CO2: 29 meq/L (ref 19–32)
Calcium: 10.4 mg/dL (ref 8.4–10.5)
Chloride: 103 meq/L (ref 96–112)
Creatinine, Ser: 1.05 mg/dL (ref 0.40–1.20)
GFR: 48.49 mL/min — ABNORMAL LOW (ref >60.00)
Glucose, Bld: 110 mg/dL — ABNORMAL HIGH (ref 70–99)
Potassium: 3.7 meq/L (ref 3.5–5.1)
Sodium: 140 meq/L (ref 135–145)

## 2024-03-01 LAB — HEMOGLOBIN A1C: Hgb A1c MFr Bld: 7.1 % — ABNORMAL HIGH (ref 4.6–6.5)

## 2024-03-01 NOTE — Patient Instructions (Signed)
  Check the  blood pressure regularly Blood pressure goal:  between 110/65 and  135/85. If it is consistently higher or lower, let me know     GO TO THE LAB : Get the blood work     Please go to the front desk: Arrange for a physical exam by August 2025

## 2024-03-01 NOTE — Progress Notes (Signed)
 Subjective:    Patient ID: Monique Bauer, female    DOB: 1936-01-14, 88 y.o.   MRN: [id number]  DOS:  03/01/2024 Type of visit - description: Follow-up  Chronic medical problems addressed. Reports stiffness and pain at the shoulders, hips.  Symptoms started few weeks after the initiation of anastrozole. Also c/o nausea in the afternoon.  Denies fever or chills.  No headaches.  No vomiting, no change in the color of the stools.  Ambulatory BPs are normal  Review of Systems See above   Past Medical History:  Diagnosis Date   Allergy 1960   Anxiety    Cervical myelopathy (HCC)    Diabetes (HCC) 12/2012   Family history of adverse reaction to anesthesia    had problems waking up pt father    GERD (gastroesophageal reflux disease)    Glaucoma    History of hiatal hernia    Hyperlipidemia    Hypertension    Osteoarthritis    Osteopenia    Spiradenoma     Past Surgical History:  Procedure Laterality Date   ANTERIOR CERVICAL DECOMP/DISCECTOMY FUSION N/A 05/17/2017   Procedure: C4-5/5-6 ACDF;  Surgeon: Donalee Citrin, MD;  Location: [facility] OR;  Service: Neurosurgery;  Laterality: N/A;   BREAST LUMPECTOMY WITH RADIOACTIVE SEED LOCALIZATION Left 09/22/2023   Procedure: LEFT BREAST RADIOACTIVE SEED LOCALIZED LUMPECTOMY;  Surgeon: Griselda Miner, MD;  Location: [facility] OR;  Service: General;  Laterality: Left;   CARPAL TUNNEL RELEASE Right 07/2017   CATARACT EXTRACTION Bilateral 2021   SPINE SURGERY  05/17/2017    Current Outpatient Medications  Medication Instructions   anastrozole (ARIMIDEX) 1 mg, Oral, Daily   atorvastatin (LIPITOR) 40 mg, Oral, Daily at bedtime   Calcium Carb-Cholecalciferol (CALCIUM 500+D PO) 1 tablet, 2 times daily   Calcium-Magnesium-Vitamin D 500-250-125 MG-MG-UNIT TABS 1 tablet Orally Twice a day for 30 day(s)   Cyanocobalamin (B-12) 5000 MCG CAPS 1 tablet, Daily   diphenhydramine-acetaminophen (TYLENOL PM) 25-500 MG TABS tablet 1 tablet, Daily at bedtime    dorzolamide-timolol (COSOPT) 2-0.5 % ophthalmic solution 1 drop, Both Eyes, 2 times daily   GLUCOSAMINE-CHONDROITIN PO 1 tablet, Daily   glucose blood (ONETOUCH VERIO) test strip CHECK BLOOD SUGAR DAILY   Lancets (ONETOUCH DELICA PLUS LANCET30G) MISC CHECK BLOOD SUGAR ONCE  DAILY   lisinopril-hydrochlorothiazide (ZESTORETIC) 10-12.5 MG tablet 2 tablets, Oral, Daily   metFORMIN (GLUCOPHAGE) 500 mg, Oral, 3 times daily with meals   Multiple Vitamin (MULTIVITAMIN WITH MINERALS) TABS tablet 1 tablet, Daily   pantoprazole (PROTONIX) 40 mg, Oral, Daily       Objective:   Physical Exam BP (!) 130/90 (BP Location: Left Arm, Patient Position: Sitting, Cuff Size: Normal)   Pulse 68   Temp 97.8 F (36.6 C) (Oral)   Resp 16   Ht 5' (1.524 m)   Wt 132 lb (59.9 kg)   SpO2 99%   BMI 25.78 kg/m  General:   Well developed, NAD, BMI noted. HEENT:  Normocephalic . Face symmetric, atraumatic Lungs:  CTA B Normal respiratory effort, no intercostal retractions, no accessory muscle use. Heart: RRR,  no murmur.  DM foot exam: Normal pedal pulses, pinprick examination normal Skin: Not pale. Not jaundice Neurologic:  alert & oriented X3.  Speech normal, gait appropriate for age and unassisted Psych--  Cognition and judgment appear intact.  Cooperative with normal attention span and concentration.  Behavior appropriate. No anxious or depressed appearing.      Assessment     Assessment Diabetes  2014 HTN Hyperlipidemia Anxiety DJD GERD Osteopenia; dexa 07-2016, 2021 wnl.   Ao [place] (-)  AAA 2010 Cervical myelopathy, s/p surgery 05-2017 CTS s/p R surgery 2018 Glaucoma, early macular degeneration Breast cancer: Dx 08-2023, L lumpectomy, anastrozole  PLAN: DM: A1c November 2024 was 7, stable, no changes made.  Continue metformin, ambulatory CBGs around 99, 100. Feet exam negative. Plan: Recheck A1c, watch diet. HTN: Was elevated last morning when she saw oncology, 161/71, at home when  checked is WNL.  Plan: Continue Zestoretic, check BMP.  Continue monitoring BPs at home. Breast cancer: On anastrozole, having side effects but has decided to continue taking the medication.  DEXA December 2024 WNL.  Next 2 years. RTC 07-2024 CPX

## 2024-03-01 NOTE — Assessment & Plan Note (Signed)
 DM: A1c November 2024 was 7, stable, no changes made.  Continue metformin, ambulatory CBGs around 99, 100. Feet exam negative. Plan: Recheck A1c, watch diet. HTN: Was elevated last morning when she saw oncology, 161/71, at home when checked is WNL.  Plan: Continue Zestoretic, check BMP.  Continue monitoring BPs at home. Breast cancer: On anastrozole, having side effects but has decided to continue taking the medication.  DEXA December 2024 WNL.  Next 2 years. RTC 07-2024 CPX

## 2024-03-04 ENCOUNTER — Encounter: Payer: Self-pay | Admitting: Internal Medicine

## 2024-05-31 ENCOUNTER — Ambulatory Visit (INDEPENDENT_AMBULATORY_CARE_PROVIDER_SITE_OTHER): Payer: Medicare Other | Admitting: *Deleted

## 2024-05-31 ENCOUNTER — Other Ambulatory Visit (HOSPITAL_BASED_OUTPATIENT_CLINIC_OR_DEPARTMENT_OTHER): Payer: Self-pay

## 2024-05-31 VITALS — Ht 60.0 in | Wt 129.0 lb

## 2024-05-31 DIAGNOSIS — Z0000 Encounter for general adult medical examination without abnormal findings: Secondary | ICD-10-CM

## 2024-05-31 MED ORDER — LATANOPROST 0.005 % OP SOLN
1.0000 [drp] | Freq: Every morning | OPHTHALMIC | 11 refills | Status: AC
  Filled 2024-05-31: qty 2.5, 25d supply, fill #0
  Filled 2024-06-29: qty 2.5, 25d supply, fill #1
  Filled 2024-08-02: qty 2.5, 25d supply, fill #2
  Filled 2024-09-01: qty 2.5, 25d supply, fill #3
  Filled 2024-09-25: qty 2.5, 25d supply, fill #4
  Filled 2024-11-04: qty 2.5, 25d supply, fill #5
  Filled 2024-11-30: qty 2.5, 25d supply, fill #6
  Filled 2024-12-21: qty 2.5, 25d supply, fill #7

## 2024-05-31 NOTE — Progress Notes (Signed)
 Subjective:   Monique Bauer is a 88 y.o. who presents for a Medicare Wellness preventive visit.  As a reminder, Annual Wellness Visits don't include a physical exam, and some assessments may be limited, especially if this visit is performed virtually. We may recommend an in-person follow-up visit with your provider if needed.  Visit Complete: Virtual I connected with  Monique Bauer on 05/31/24 by a audio enabled telemedicine application and verified that I am speaking with the correct person using two identifiers.  Patient Location: Home  Provider Location: Office/Clinic  I discussed the limitations of evaluation and management by telemedicine. The patient expressed understanding and agreed to proceed.  Vital Signs: Because this visit was a virtual/telehealth visit, some criteria may be missing or patient reported. Any vitals not documented were not able to be obtained and vitals that have been documented are patient reported.  VideoDeclined- This patient declined Librarian, academic. Therefore the visit was completed with audio only.  Persons Participating in Visit: Patient.  AWV Questionnaire: Yes: Patient Medicare AWV questionnaire was completed by the patient on 05/24/24; I have confirmed that all information answered by patient is correct and no changes since this date.  Cardiac Risk Factors include: advanced age (>66men, >53 women);diabetes mellitus;hypertension;dyslipidemia     Objective:    Today's Vitals   05/31/24 0902  Weight: 129 lb (58.5 kg)  Height: 5' (1.524 m)   Body mass index is 25.19 kg/m.     05/31/2024    9:11 AM 09/22/2023   10:30 AM 09/16/2023    9:25 AM 05/26/2023    8:21 AM 04/22/2022    9:42 AM 03/14/2018   10:15 AM 05/17/2017   12:31 PM  Advanced Directives  Does Patient Have a Medical Advance Directive? Yes Yes Yes Yes Yes Yes  Yes   Type of Estate agent of [employer] Power of  [place];Living will Healthcare Power of [place];Living will Healthcare Power of [place];Living will Healthcare Power of [place];Out of facility DNR (pink MOST or yellow form);Living will Healthcare Power of [place];Living will Healthcare Power of [place];Living will  Does patient want to make changes to medical advance directive? No - Patient declined No - Patient declined No - Patient declined No - Patient declined No - Patient declined  No - Patient declined   Copy of Healthcare Power of Attorney in Chart? Yes - validated most recent copy scanned in chart (See row information)  Yes - validated most recent copy scanned in chart (See row information) Yes - validated most recent copy scanned in chart (See row information) Yes - validated most recent copy scanned in chart (See row information) Yes  Yes      Data saved with a previous flowsheet row definition    Current Medications (verified) Outpatient Encounter Medications as of 05/31/2024  Medication Sig   anastrozole  (ARIMIDEX ) 1 MG tablet Take 1 tablet (1 mg total) by mouth daily.   atorvastatin  (LIPITOR) 40 MG tablet Take 1 tablet (40 mg total) by mouth at bedtime.   Calcium  Carb-Cholecalciferol (CALCIUM  500+D PO) Take 1 tablet by mouth 2 (two) times daily.   Calcium -Magnesium-Vitamin D  500-250-125 MG-MG-UNIT TABS 1 tablet Orally Twice a day for 30 day(s)   Cyanocobalamin (B-12) 5000 MCG CAPS Take 1 tablet by mouth daily.   diphenhydramine -acetaminophen  (TYLENOL  PM) 25-500 MG TABS tablet Take 1 tablet by mouth at bedtime.   dorzolamide -timolol  (COSOPT ) 2-0.5 % ophthalmic solution Place 1 drop into both eyes 2 (two) times  daily.   GLUCOSAMINE-CHONDROITIN PO Take 1 tablet by mouth daily.   glucose blood (ONETOUCH VERIO) test strip CHECK BLOOD SUGAR DAILY   Lancets (ONETOUCH DELICA PLUS LANCET30G) MISC CHECK BLOOD SUGAR ONCE  DAILY   lisinopril -hydrochlorothiazide  (ZESTORETIC ) 10-12.5 MG tablet Take 2 tablets by mouth daily.   metFORMIN   (GLUCOPHAGE ) 1000 MG tablet Take 0.5 tablets (500 mg total) by mouth 3 (three) times daily with meals.   Multiple Vitamin (MULTIVITAMIN WITH MINERALS) TABS tablet Take 1 tablet by mouth daily.   pantoprazole  (PROTONIX ) 40 MG tablet Take 1 tablet (40 mg total) by mouth daily.   No facility-administered encounter medications on file as of 05/31/2024.    Allergies (verified) Bactrim [sulfamethoxazole-trimethoprim], Oxycodone  hcl, and Sulfa antibiotics   History: Past Medical History:  Diagnosis Date   Allergy 1960   Anxiety    Cervical myelopathy (HCC)    Diabetes (HCC) 12/2012   Family history of adverse reaction to anesthesia    had problems waking up pt father    GERD (gastroesophageal reflux disease)    Glaucoma    History of hiatal hernia    Hyperlipidemia    Hypertension    Osteoarthritis    Osteopenia    Spiradenoma    Past Surgical History:  Procedure Laterality Date   ANTERIOR CERVICAL DECOMP/DISCECTOMY FUSION N/A 05/17/2017   Procedure: C4-5/5-6 ACDF;  Surgeon: Gearl Keens, MD;  Location: [facility] OR;  Service: Neurosurgery;  Laterality: N/A;   BREAST LUMPECTOMY WITH RADIOACTIVE SEED LOCALIZATION Left 09/22/2023   Procedure: LEFT BREAST RADIOACTIVE SEED LOCALIZED LUMPECTOMY;  Surgeon: Caralyn Chandler, MD;  Location: [facility] OR;  Service: General;  Laterality: Left;   CARPAL TUNNEL RELEASE Right 07/2017   CATARACT EXTRACTION Bilateral 2021   SPINE SURGERY  05/17/2017   Family History  Problem Relation Age of Onset   Coronary artery disease Mother        M at age 42 and F   Diabetes Mother        M, F, daughter, sister   Hypertension Mother    Arthritis Mother    Early death Mother    Varicose Veins Mother    Stroke Father    Diabetes Father    Asthma Sister    Diabetes Sister    Kidney cancer Daughter        32   Diabetes Daughter    Hypertension Daughter    Diabetes Daughter    Breast cancer Daughter 28   Hypertension Daughter    Breast cancer Daughter         caught early, taking oral chemo   Breast cancer Paternal Aunt        dx 93s   Colon cancer Neg Hx    Social History   Socioeconomic History   Marital status: Married    Spouse name: Ronny   Number of children: 3   Years of education: Not on file   Highest education level: Some college, no degree  Occupational History   Occupation: retired     Associate Professor: RETIRED  Tobacco Use   Smoking status: Former    Current packs/day: 0.00    Average packs/day: 0.3 packs/day for 30.0 years (7.5 ttl pk-yrs)    Types: Cigarettes    Start date: 10/16/1951    Quit date: 10/15/1981    Years since quitting: 42.6   Smokeless tobacco: Never   Tobacco comments:    was a social smoker  Vaping Use   Vaping status: Never Used  Substance  and Sexual Activity   Alcohol use: Yes    Alcohol/week: 1.0 standard drink of alcohol    Types: 1 Glasses of wine per week    Comment: Socially and only occasionally   Drug use: No   Sexual activity: Not Currently  Other Topics Concern   Not on file  Social History Narrative   Lives w/ husband (married x 3);  3 daughters live in [place], 5 G-children, 2 GG son    Social Drivers of Corporate investment banker Strain: Low Risk  (05/31/2024)   Overall Financial Resource Strain (CARDIA)    Difficulty of Paying Living Expenses: Not hard at all  Food Insecurity: No Food Insecurity (05/31/2024)   Hunger Vital Sign    Worried About Running Out of Food in the Last Year: Never true    Ran Out of Food in the Last Year: Never true  Transportation Needs: No Transportation Needs (05/31/2024)   PRAPARE - Administrator, Civil Service (Medical): No    Lack of Transportation (Non-Medical): No  Physical Activity: Insufficiently Active (05/31/2024)   Exercise Vital Sign    Days of Exercise per Week: 2 days    Minutes of Exercise per Session: 20 min  Stress: No Stress Concern Present (05/31/2024)   [employer] of Occupational Health - Occupational Stress Questionnaire     Feeling of Stress: Only a little  Social Connections: Socially Integrated (05/31/2024)   Social Connection and Isolation Panel    Frequency of Communication with Friends and Family: More than three times a week    Frequency of Social Gatherings with Friends and Family: More than three times a week    Attends Religious Services: More than 4 times per year    Active Member of [employer] or Organizations: Yes    Attends Engineer, structural: More than 4 times per year    Marital Status: Married    Tobacco Counseling Counseling given: Not Answered Tobacco comments: was a social smoker    Clinical Intake:  Pre-visit preparation completed: Yes  Pain : No/denies pain     BMI - recorded: 25.19 Nutritional Status: BMI 25 -29 Overweight Nutritional Risks: Other (Comment) Diabetes: Yes CBG done?: No Did pt. bring in CBG monitor from home?: No  Lab Results  Component Value Date   HGBA1C 7.1 (H) 03/01/2024   HGBA1C 7.0 (H) 11/03/2023   HGBA1C 6.9 (H) 08/04/2023     How often do you need to have someone help you when you read instructions, pamphlets, or other written materials from your doctor or pharmacy?: 1 - Never  Interpreter Needed?: No  Information entered by :: Emily Harding   Activities of Daily Living     05/24/2024    8:39 AM 09/16/2023    9:35 AM  In your present state of health, do you have any difficulty performing the following activities:  Hearing? 0   Vision? 0   Difficulty concentrating or making decisions? 0   Walking or climbing stairs? 1   Comment joint pain /stiffness   Dressing or bathing? 0   Doing errands, shopping? 0 0  Preparing Food and eating ? N   Using the Toilet? N   In the past six months, have you accidently leaked urine? N   Do you have problems with loss of bowel control? N   Managing your Medications? N   Managing your Finances? N   Housekeeping or managing your Housekeeping? N     Patient Care  Team: Ezell Hollow,  MD as PCP - General Artemisa Lars, MD as Consulting Physician (Ophthalmology) Gearl Keens, MD as Consulting Physician (Neurosurgery) Lyndol Santee Sherida Dimmer, MD as Referring Physician (Ophthalmology) Caralyn Chandler, MD as Consulting Physician (General Surgery) Retta Caster, MD as Consulting Physician (Radiation Oncology) Cameron Cea, MD as Consulting Physician (Hematology and Oncology)  I have updated your Care Teams any recent Medical Services you may have received from other providers in the past year.     Assessment:   This is a routine wellness examination for [facility].  Hearing/Vision screen Hearing Screening - Comments:: Denies hearing difficulties.  Vision Screening - Comments:: Wears RX glasses -- up to date with routine eye exams on 12/01/23.    Goals Addressed               This Visit's Progress     Patient Stated (pt-stated)        She would like to be able to move around better.       Depression Screen     05/31/2024    9:13 AM 11/03/2023    9:03 AM 09/08/2023    4:06 PM 08/04/2023    9:00 AM 05/26/2023    8:26 AM 10/28/2022    8:01 AM 04/22/2022    9:43 AM  PHQ 2/9 Scores  PHQ - 2 Score 0 0 0 2 0 0 0  PHQ- 9 Score 1   2       Fall Risk     05/24/2024    8:39 AM 11/03/2023    9:03 AM 08/04/2023    9:00 AM 05/25/2023   11:01 AM 04/22/2023    6:21 PM  Fall Risk   Falls in the past year? 0 0 1 1 1   Comment    misstepped on the escalator   Number falls in past yr: 0 0 0 0 0  Injury with Fall? 0 0 1 1 1   Risk for fall due to : History of fall(s)   History of fall(s)   Follow up Falls evaluation completed Falls evaluation completed Falls evaluation completed;Education provided Falls evaluation completed     MEDICARE RISK AT HOME:  Medicare Risk at Home Any stairs in or around the home?: (Patient-Rptd) No If so, are there any without handrails?: (Patient-Rptd) No Home free of loose throw rugs in walkways, pet beds, electrical cords, etc?:  (Patient-Rptd) Yes Adequate lighting in your home to reduce risk of falls?: (Patient-Rptd) Yes Life alert?: (Patient-Rptd) No Use of a cane, walker or w/c?: (Patient-Rptd) No Grab bars in the bathroom?: (Patient-Rptd) Yes Shower chair or bench in shower?: (Patient-Rptd) No Elevated toilet seat or a handicapped toilet?: (Patient-Rptd) No  TIMED UP AND GO:  Was the test performed?  No, audio  Cognitive Function: 6CIT completed    03/14/2018   10:22 AM 01/07/2017    9:25 AM  MMSE - Mini Mental State Exam  Orientation to time 5 5   Orientation to Place 5 5   Registration 3 3   Attention/ Calculation 5 5   Recall 3 3   Language- name 2 objects 2 2   Language- repeat 1 1  Language- follow 3 step command 3 3   Language- read & follow direction 1 1   Write a sentence 1 1   Copy design 1 1   Total score 30 30      Data saved with a previous flowsheet row definition        05/31/2024  9:16 AM 05/26/2023    8:32 AM 04/22/2022    9:45 AM  6CIT Screen  What Year? 0 points 0 points 0 points  What month? 0 points 0 points 0 points  What time? 0 points 0 points 0 points  Count back from 20 0 points 0 points 0 points  Months in reverse 0 points 0 points 0 points  Repeat phrase 0 points 0 points 0 points  Total Score 0 points 0 points 0 points    Immunizations Immunization History  Administered Date(s) Administered   Fluad  Quad(high Dose 65+) 09/07/2022   Fluad  Trivalent(High Dose 65+) 10/04/2023   Influenza Split 09/18/2011, 09/14/2012, 08/21/2021   Influenza Whole 09/28/2007, 10/03/2008, 09/11/2009, 09/16/2010   Influenza, High Dose Seasonal PF 09/11/2015, 09/03/2016, 09/13/2018   Influenza,inj,Quad PF,6+ Mos 09/06/2013, 09/05/2014, 07/26/2019   Influenza-Unspecified 08/04/2017, 09/13/2018, 09/13/2020   PFIZER Comirnaty (Gray Top)Covid-19 Tri-Sucrose Vaccine 03/20/2021   PFIZER(Purple Top)SARS-COV-2 Vaccination 12/27/2019, 01/16/2020, 09/09/2020   PNEUMOCOCCAL CONJUGATE-20  04/27/2022   Pfizer Covid-19 Vaccine Bivalent Booster 54yrs & up 09/04/2021   Pfizer(Comirnaty )Fall Seasonal Vaccine 12 years and older 09/28/2022, 10/19/2023   Pneumococcal Conjugate-13 04/19/2015   Pneumococcal Polysaccharide-23 12/14/2005, 01/11/2014   Respiratory Syncytial Virus Vaccine ,Recomb Aduvanted(Arexvy ) 11/18/2022   Td 12/14/1998, 11/11/2009   Tdap 01/04/2019   Zoster Recombinant(Shingrix) 10/01/2017, 12/16/2017   Zoster, Live 01/25/2008    Screening Tests Health Maintenance  Topic Date Due   FOOT EXAM  04/28/2023   Medicare Annual Wellness (AWV)  05/25/2024   INFLUENZA VACCINE  07/14/2024   HEMOGLOBIN A1C  09/01/2024   Diabetic kidney evaluation - Urine ACR  11/02/2024   OPHTHALMOLOGY EXAM  11/30/2024   Diabetic kidney evaluation - eGFR measurement  03/01/2025   DTaP/Tdap/Td (4 - Td or Tdap) 01/04/2029   Pneumococcal Vaccine: 50+ Years  Completed   DEXA SCAN  Completed   Zoster Vaccines- Shingrix  Completed   HPV VACCINES  Aged Out   Meningococcal B Vaccine  Aged Out   COVID-19 Vaccine  Discontinued    Health Maintenance  Health Maintenance Due  Topic Date Due   FOOT EXAM  04/28/2023   Medicare Annual Wellness (AWV)  05/25/2024   Health Maintenance Items Addressed: Will get foot exam at upcoming visit. All other HM up to date.  Additional Screening:  Vision Screening: Recommended annual ophthalmology exams for early detection of glaucoma and other disorders of the eye. Would you like a referral to an eye doctor? No    Dental Screening: Recommended annual dental exams for proper oral hygiene  Community Resource Referral / Chronic Care Management: CRR required this visit?  No   CCM required this visit?  No   Plan:    I have personally reviewed and noted the following in the patient's chart:   Medical and social history Use of alcohol, tobacco or illicit drugs  Current medications and supplements including opioid prescriptions. Patient is not  currently taking opioid prescriptions. Functional ability and status Nutritional status Physical activity Advanced directives List of other physicians Hospitalizations, surgeries, and ER visits in previous 12 months Vitals Screenings to include cognitive, depression, and falls Referrals and appointments  In addition, I have reviewed and discussed with patient certain preventive protocols, quality metrics, and best practice recommendations. A written personalized care plan for preventive services as well as general preventive health recommendations were provided to patient.   Susa Engman, CMA   05/31/2024   After Visit Summary: (MyChart) Due to this being a telephonic visit, the after visit summary with  patients personalized plan was offered to patient via MyChart   Notes: Nothing significant to report at this time.

## 2024-05-31 NOTE — Patient Instructions (Signed)
 Ms. Neitzke , Thank you for taking time out of your busy schedule to complete your Annual Wellness Visit with me. I enjoyed our conversation and look forward to speaking with you again next year. I, as well as your care team,  appreciate your ongoing commitment to your health goals. Please review the following plan we discussed and let me know if I can assist you in the future. Your Game plan/ To Do List     Follow up Visits: Next Medicare AWV with our clinical staff:  06/01/24 9am Next Office Visit with your provider: 08/07/24 8:20. Remember to have   your foot exam at your upcoming visit with Dr Neomi Banks.  (Also, tell him   about your bilateral foot hypersensitivity.)  Clinician Recommendations:  Aim for 30 minutes of exercise or brisk walking, 6-8 glasses of water, and 5 servings of fruits and vegetables each day.       This is a list of the screening recommended for you and due dates:  Health Maintenance  Topic Date Due   Complete foot exam   04/28/2023   Medicare Annual Wellness Visit  05/25/2024   Flu Shot  07/14/2024   Hemoglobin A1C  09/01/2024   Yearly kidney health urinalysis for diabetes  11/02/2024   Eye exam for diabetics  11/30/2024   Yearly kidney function blood test for diabetes  03/01/2025   DTaP/Tdap/Td vaccine (4 - Td or Tdap) 01/04/2029   Pneumococcal Vaccine for age over 43  Completed   DEXA scan (bone density measurement)  Completed   Zoster (Shingles) Vaccine  Completed   HPV Vaccine  Aged Out   Meningitis B Vaccine  Aged Out   COVID-19 Vaccine  Discontinued    See attachments for Exercise.

## 2024-06-27 ENCOUNTER — Encounter: Payer: Self-pay | Admitting: Internal Medicine

## 2024-06-27 MED ORDER — ACCU-CHEK SOFTCLIX LANCETS MISC: 12 refills | Status: AC

## 2024-06-27 MED ORDER — ACCU-CHEK GUIDE ME W/DEVICE KIT: PACK | 0 refills | Status: AC

## 2024-06-27 MED ORDER — ACCU-CHEK GUIDE TEST VI STRP: ORAL_STRIP | 12 refills | Status: DC

## 2024-06-29 ENCOUNTER — Telehealth: Payer: Self-pay | Admitting: Hematology and Oncology

## 2024-06-29 NOTE — Telephone Encounter (Signed)
 Spoke with patient confirming upcoming appointment

## 2024-07-03 MED ORDER — ACCU-CHEK GUIDE TEST VI STRP: ORAL_STRIP | 12 refills | Status: AC

## 2024-07-03 NOTE — Addendum Note (Signed)
 Addended by: Irina Okelly D on: 07/03/2024 01:29 PM   Modules accepted: Orders

## 2024-07-20 ENCOUNTER — Other Ambulatory Visit: Payer: Self-pay | Admitting: Internal Medicine

## 2024-07-26 ENCOUNTER — Ambulatory Visit: Payer: Medicare Other | Admitting: Hematology and Oncology

## 2024-08-01 ENCOUNTER — Inpatient Hospital Stay: Attending: Hematology and Oncology | Admitting: Hematology and Oncology

## 2024-08-01 VITALS — BP 110/80 | HR 82 | Temp 97.6°F | Resp 18 | Ht 60.0 in | Wt 127.3 lb

## 2024-08-01 DIAGNOSIS — Z170 Estrogen receptor positive status [ER+]: Secondary | ICD-10-CM | POA: Insufficient documentation

## 2024-08-01 DIAGNOSIS — Z79811 Long term (current) use of aromatase inhibitors: Secondary | ICD-10-CM | POA: Diagnosis not present

## 2024-08-01 DIAGNOSIS — Z1721 Progesterone receptor positive status: Secondary | ICD-10-CM | POA: Insufficient documentation

## 2024-08-01 DIAGNOSIS — Z1732 Human epidermal growth factor receptor 2 negative status: Secondary | ICD-10-CM | POA: Insufficient documentation

## 2024-08-01 DIAGNOSIS — Z79899 Other long term (current) drug therapy: Secondary | ICD-10-CM | POA: Insufficient documentation

## 2024-08-01 DIAGNOSIS — C50212 Malignant neoplasm of upper-inner quadrant of left female breast: Secondary | ICD-10-CM | POA: Diagnosis present

## 2024-08-01 NOTE — Progress Notes (Signed)
 Patient Care Team: Amon Aloysius BRAVO, MD as PCP - General Meridee Seltzer, MD as Consulting Physician (Ophthalmology) Onetha Kuba, MD as Consulting Physician (Neurosurgery) Beverlee Modesto GAILS, MD as Referring Physician (Ophthalmology) Curvin Deward MOULD, MD as Consulting Physician (General Surgery) Shannon Agent, MD as Consulting Physician (Radiation Oncology) Odean Potts, MD as Consulting Physician (Hematology and Oncology)  DIAGNOSIS:  Encounter Diagnosis  Name Primary?   Malignant neoplasm of upper-inner quadrant of left breast in female, estrogen receptor positive (HCC) Yes    SUMMARY OF ONCOLOGIC HISTORY: Oncology History  Malignant neoplasm of upper-inner quadrant of left breast in female, estrogen receptor positive (HCC)  08/31/2023 Initial Diagnosis   Screening mammogram detected left breast mass 0.6 cm at 10 o'clock position, axilla negative, biopsy revealed grade 1 IDC with intermediate grade DCIS ER 100%, PR 95%, Ki67 10%, HER2 1+ negative   09/08/2023 Cancer Staging   Staging form: Breast, AJCC 8th Edition - Clinical stage from 09/08/2023: Stage IA (cT1b, cN0, cM0, G1, ER+, PR+, HER2-) - Signed by Odean Potts, MD on 09/08/2023 Stage prefix: Initial diagnosis Histologic grading system: 3 grade system Laterality: Left Staged by: Pathologist and managing physician National guidelines used in treatment planning: Yes Type of national guideline used in treatment planning: NCCN   09/22/2023 Surgery   Left lumpectomy: Grade 1 IDC 0.9 cm with intermediate grade DCIS, margins negative, negative for lymphovascular perineural invasion, ER 100%, PR 95%, HER2 1+ negative, Ki-67 10%    09/23/2023 Genetic Testing   Negative [facility] CancerNext-Expanded +RNAinsight Panel.  Report date is 09/23/2023.   The CancerNext-Expanded gene panel offered by [facility] and includes sequencing, rearrangement, and RNA analysis for the following 71 genes:  AIP, ALK, APC, ATM, BAP1, BARD1, BMPR1A,  BRCA1, BRCA2, BRIP1, CDC73, CDH1, CDK4, CDKN1B, CDKN2A, CHEK2, DICER1, FH, FLCN, KIF1B, LZTR1,MAX, MEN1, MET, MLH1, MSH2, MSH6, MUTYH, NF1, NF2, NTHL1, PALB2, PHOX2B, PMS2, POT1, PRKAR1A, PTCH1, PTEN, RAD51C,RAD51D, RB1, RET, SDHA, SDHAF2, SDHB, SDHC, SDHD, SMAD4, SMARCA4, SMARCB1, SMARCE1, STK11, SUFU, TMEM127, TP53,TSC1, TSC2 and VHL (sequencing and deletion/duplication); AXIN2, CTNNA1, EGFR, EGLN1, HOXB13, KIT, MITF, MSH3, PDGFRA, POLD1 and POLE (sequencing only); EPCAM and GREM1 (deletion/duplication only).    10/2023 -  Anti-estrogen oral therapy   1 mg Anastrozole  x 5 years     CHIEF COMPLIANT:   HISTORY OF PRESENT ILLNESS:   History of Present Illness Monique Bauer is an 88 year old female who presents for follow-up regarding anastrozole  treatment.  She experiences musculoskeletal aches and pains associated with anastrozole , particularly in her shoulders, thumbs, and knees. These symptoms have improved over time and are less severe than initially. She has no hot flashes. She is scheduled for a mammogram on September 12th at [facility]. She has been on anastrozole  for almost a year and is managing the side effects well enough to continue the medication. No chest pain or discomfort.     ALLERGIES:  is allergic to bactrim [sulfamethoxazole-trimethoprim], oxycodone  hcl, and sulfa antibiotics.  MEDICATIONS:  Current Outpatient Medications  Medication Sig Dispense Refill   Accu-Chek Softclix Lancets lancets Check blood sugars once daily 100 each 12   anastrozole  (ARIMIDEX ) 1 MG tablet Take 1 tablet (1 mg total) by mouth daily. 90 tablet 3   atorvastatin  (LIPITOR) 40 MG tablet Take 1 tablet (40 mg total) by mouth at bedtime. 90 tablet 1   Blood Glucose Monitoring Suppl (ACCU-CHEK GUIDE ME) w/Device KIT Check blood sugars once daily 1 kit 0   Calcium  Carb-Cholecalciferol (CALCIUM  500+D PO) Take 1 tablet  by mouth 2 (two) times daily.     Calcium -Magnesium-Vitamin D  500-250-125 MG-MG-UNIT TABS  1 tablet Orally Twice a day for 30 day(s)     Cyanocobalamin (B-12) 5000 MCG CAPS Take 1 tablet by mouth daily.     diphenhydramine -acetaminophen  (TYLENOL  PM) 25-500 MG TABS tablet Take 1 tablet by mouth at bedtime.     dorzolamide -timolol  (COSOPT ) 2-0.5 % ophthalmic solution Place 1 drop into both eyes 2 (two) times daily. 10 mL 11   GLUCOSAMINE-CHONDROITIN PO Take 1 tablet by mouth daily.     glucose blood (ACCU-CHEK GUIDE TEST) test strip Check blood sugars once daily 100 each 12   latanoprost  (XALATAN ) 0.005 % ophthalmic solution Place 1 drop into both eyes every morning. 2.5 mL 11   lisinopril -hydrochlorothiazide  (ZESTORETIC ) 10-12.5 MG tablet Take 2 tablets by mouth daily. 180 tablet 1   metFORMIN  (GLUCOPHAGE ) 1000 MG tablet Take 0.5 tablets (500 mg total) by mouth with breakfast, with lunch, and with evening meal. 135 tablet 1   Multiple Vitamin (MULTIVITAMIN WITH MINERALS) TABS tablet Take 1 tablet by mouth daily.     pantoprazole  (PROTONIX ) 40 MG tablet Take 1 tablet (40 mg total) by mouth daily. 90 tablet 1   No current facility-administered medications for this visit.    PHYSICAL EXAMINATION: ECOG PERFORMANCE STATUS: 1 - Symptomatic but completely ambulatory  Vitals:   08/01/24 1014  BP: 110/80  Pulse: 82  Resp: 18  Temp: 97.6 F (36.4 C)  SpO2: 98%   Filed Weights   08/01/24 1014  Weight: 127 lb 4.8 oz (57.7 kg)    Physical Exam No palpable lumps or nodules in bilateral breasts or axilla  (exam performed in the presence of a chaperone)  LABORATORY DATA:  I have reviewed the data as listed    Latest Ref Rng & Units 03/01/2024    9:58 AM 11/03/2023    9:46 AM 09/08/2023   12:11 PM  CMP  Glucose 70 - 99 mg/dL 889  890  873   BUN 6 - 23 mg/dL 27  26  23    Creatinine 0.40 - 1.20 mg/dL 8.94  8.83  8.64   Sodium 135 - 145 mEq/L 140  139  142   Potassium 3.5 - 5.1 mEq/L 3.7  3.8  3.8   Chloride 96 - 112 mEq/L 103  104  106   CO2 19 - 32 mEq/L 29  27  26    Calcium   8.4 - 10.5 mg/dL 89.5  9.6  9.9   Total Protein 6.5 - 8.1 g/dL   7.9   Total Bilirubin 0.3 - 1.2 mg/dL   0.4   Alkaline Phos 38 - 126 U/L   107   AST 15 - 41 U/L   23   ALT 0 - 44 U/L   23     Lab Results  Component Value Date   WBC 8.9 09/08/2023   HGB 12.8 09/08/2023   HCT 39.1 09/08/2023   MCV 86.5 09/08/2023   PLT 299 09/08/2023   NEUTROABS 4.9 09/08/2023    ASSESSMENT & PLAN:  Malignant neoplasm of upper-inner quadrant of left breast in female, estrogen receptor positive (HCC) 08/31/2023:Screening mammogram detected left breast mass 0.6 cm at 10 o'clock position, axilla negative, biopsy revealed grade 1 IDC with intermediate grade DCIS ER 100%, PR 95%, Ki67 10%, HER2 1+ negative    09/22/2023: Left lumpectomy: Grade 1 IDC 0.9 cm with intermediate grade DCIS, margins negative, negative for lymphovascular perineural invasion, ER 100%, PR 95%,  HER2 1+ negative, Ki-67 10%  Treatment plan: Adjuvant antiestrogen therapy with anastrozole  1 mg daily x 5 years started 10/18/2023 Anastrozole  toxicities: Mild body aches and pains: They appear to have improved significantly.  Breast cancer surveillance: Mammograms scheduled for September. Breast exam 08/01/2024: Benign  Return to clinic in 1 year for follow-up    No orders of the defined types were placed in this encounter.  The patient has a good understanding of the overall plan. she agrees with it. she will call with any problems that may develop before the next visit here. Total time spent: 30 mins including face to face time and time spent for planning, charting and co-ordination of care   Naomi MARLA Chad, MD 08/01/24

## 2024-08-01 NOTE — Assessment & Plan Note (Signed)
 08/31/2023:Screening mammogram detected left breast mass 0.6 cm at 10 o'clock position, axilla negative, biopsy revealed grade 1 IDC with intermediate grade DCIS ER 100%, PR 95%, Ki67 10%, HER2 1+ negative    09/22/2023: Left lumpectomy: Grade 1 IDC 0.9 cm with intermediate grade DCIS, margins negative, negative for lymphovascular perineural invasion, ER 100%, PR 95%, HER2 1+ negative, Ki-67 10%  Treatment plan: Adjuvant antiestrogen therapy with anastrozole  1 mg daily x 5 years started 10/18/2023 Anastrozole  toxicities:  Breast cancer surveillance: Mammograms and breast exams annually  Return to clinic in 1 year for follow-up

## 2024-08-02 ENCOUNTER — Other Ambulatory Visit (HOSPITAL_BASED_OUTPATIENT_CLINIC_OR_DEPARTMENT_OTHER): Payer: Self-pay

## 2024-08-02 MED ORDER — DORZOLAMIDE HCL-TIMOLOL MAL 2-0.5 % OP SOLN
1.0000 [drp] | Freq: Two times a day (BID) | OPHTHALMIC | 11 refills | Status: AC
  Filled 2024-08-02: qty 10, 90d supply, fill #0
  Filled 2024-10-09: qty 10, 50d supply, fill #1
  Filled 2024-12-21: qty 10, 50d supply, fill #2

## 2024-08-07 ENCOUNTER — Ambulatory Visit (INDEPENDENT_AMBULATORY_CARE_PROVIDER_SITE_OTHER): Admitting: Internal Medicine

## 2024-08-07 ENCOUNTER — Encounter: Payer: Self-pay | Admitting: Internal Medicine

## 2024-08-07 VITALS — BP 122/80 | HR 70 | Temp 97.8°F | Resp 16 | Ht 60.0 in | Wt 127.4 lb

## 2024-08-07 DIAGNOSIS — E785 Hyperlipidemia, unspecified: Secondary | ICD-10-CM | POA: Diagnosis not present

## 2024-08-07 DIAGNOSIS — I10 Essential (primary) hypertension: Secondary | ICD-10-CM

## 2024-08-07 DIAGNOSIS — E1141 Type 2 diabetes mellitus with diabetic mononeuropathy: Secondary | ICD-10-CM

## 2024-08-07 DIAGNOSIS — Z0000 Encounter for general adult medical examination without abnormal findings: Secondary | ICD-10-CM | POA: Diagnosis not present

## 2024-08-07 DIAGNOSIS — Z0001 Encounter for general adult medical examination with abnormal findings: Secondary | ICD-10-CM

## 2024-08-07 DIAGNOSIS — F419 Anxiety disorder, unspecified: Secondary | ICD-10-CM

## 2024-08-07 DIAGNOSIS — Z7984 Long term (current) use of oral hypoglycemic drugs: Secondary | ICD-10-CM | POA: Diagnosis not present

## 2024-08-07 LAB — BASIC METABOLIC PANEL WITH GFR
BUN: 23 mg/dL (ref 6–23)
CO2: 28 meq/L (ref 19–32)
Calcium: 9.4 mg/dL (ref 8.4–10.5)
Chloride: 105 meq/L (ref 96–112)
Creatinine, Ser: 1.1 mg/dL (ref 0.40–1.20)
GFR: 45.72 mL/min — ABNORMAL LOW (ref >60.00)
Glucose, Bld: 102 mg/dL — ABNORMAL HIGH (ref 70–99)
Potassium: 3.9 meq/L (ref 3.5–5.1)
Sodium: 143 meq/L (ref 135–145)

## 2024-08-07 LAB — AST: AST: 16 U/L (ref 0–37)

## 2024-08-07 LAB — CBC WITH DIFFERENTIAL/PLATELET
Basophils Absolute: 0 K/uL (ref 0.0–0.1)
Basophils Relative: 0.7 % (ref 0.0–3.0)
Eosinophils Absolute: 0.2 K/uL (ref 0.0–0.7)
Eosinophils Relative: 3.3 % (ref 0.0–5.0)
HCT: 36.9 % (ref 36.0–46.0)
Hemoglobin: 12.2 g/dL (ref 12.0–15.0)
Lymphocytes Relative: 29.9 % (ref 12.0–46.0)
Lymphs Abs: 2 K/uL (ref 0.7–4.0)
MCHC: 33 g/dL (ref 30.0–36.0)
MCV: 87.9 fl (ref 78.0–100.0)
Monocytes Absolute: 0.6 K/uL (ref 0.1–1.0)
Monocytes Relative: 8.3 % (ref 3.0–12.0)
Neutro Abs: 3.9 K/uL (ref 1.4–7.7)
Neutrophils Relative %: 57.8 % (ref 43.0–77.0)
Platelets: 255 K/uL (ref 150.0–400.0)
RBC: 4.2 Mil/uL (ref 3.87–5.11)
RDW: 14.6 % (ref 11.5–15.5)
WBC: 6.8 K/uL (ref 4.0–10.5)

## 2024-08-07 LAB — HEMOGLOBIN A1C: Hgb A1c MFr Bld: 7 % — ABNORMAL HIGH (ref 4.6–6.5)

## 2024-08-07 LAB — MICROALBUMIN / CREATININE URINE RATIO
Creatinine,U: 82.8 mg/dL
Microalb Creat Ratio: 12.2 mg/g (ref 0.0–30.0)
Microalb, Ur: 1 mg/dL (ref 0.0–1.9)

## 2024-08-07 LAB — LIPID PANEL
Cholesterol: 131 mg/dL (ref 0–200)
HDL: 39 mg/dL — ABNORMAL LOW (ref >39.00)
LDL Cholesterol: 59 mg/dL (ref 0–99)
NonHDL: 91.57
Total CHOL/HDL Ratio: 3
Triglycerides: 163 mg/dL — ABNORMAL HIGH (ref 0.0–149.0)
VLDL: 32.6 mg/dL (ref 0.0–40.0)

## 2024-08-07 LAB — B12 AND FOLATE PANEL
Folate: >23.4 ng/mL (ref >5.9)
Vitamin B-12: >1500 pg/mL — ABNORMAL HIGH (ref 211–911)

## 2024-08-07 LAB — ALT: ALT: 17 U/L (ref 0–35)

## 2024-08-07 NOTE — Assessment & Plan Note (Signed)
 Here   for CPX   Other issues addressed DM: On metformin , ambulatory CBGs 88-90.  Checking A1c. Neuropathy?  Have some sensitivity on her feet, exam is negative.  Unclear if this is neuropathy, rec trial w/ capsaicin,  check a B12.   HTN: Zestoretic , ambulatory BPs in the 120s.  Checking labs. High cholesterol: Atorvastatin .  Check FLP AST ALT Anxiety: Admits to some stress, mostly related to one of her daughters.  Listening therapy provided. History of osteopenia: DEXA 11/2023, WNL.  Next in 2 years Breast cancer: LOV oncology 08/01/2024.  Current treatment: Anastrozole .  Next visit 1 year RTC 6 months.  Sooner if needed.

## 2024-08-07 NOTE — Patient Instructions (Addendum)
 Vaccines are recommended Flu shot this fall A COVID booster RSV at the pharmacy  For food sensitivity: You can use a cream called capsaicin daily.  Continue checking your blood pressure regularly Blood pressure goal:  between 110/65 and  135/85. If it is consistently higher or lower, let me know     GO TO THE LAB :  Get the blood work   Your results will be posted on MyChart with my comments  Go to the front desk for the checkout Please make an appointment for a checkup in 6 months. Come back sooner if needed

## 2024-08-07 NOTE — Progress Notes (Signed)
 Subjective:    Patient ID: Monique Bauer, female    DOB: [birth year], 88 y.o.   MRN: [id number]  DOS:  08/07/2024 Type of visit - description: Here for CPX  Here for CPX. Chronic medical problems addressed.  Denies chest pain or difficulty breathing No nausea vomiting.  No blood in the stools. No LUTS.  Admits to some stress but she is managing well. Has noted her feet to be very sensitive.  Denies numbness or actual pain.  Symptoms are not worse at night.  Most noticeable when she gets a pedicure.  Review of Systems  Other than above, a 14 point review of systems is negative    Past Medical History:  Diagnosis Date   Allergy 1960   Anxiety    Cervical myelopathy (HCC)    Diabetes (HCC) 12/2012   Family history of adverse reaction to anesthesia    had problems waking up pt father    GERD (gastroesophageal reflux disease)    Glaucoma    History of hiatal hernia    Hyperlipidemia    Hypertension    Osteoarthritis    Osteopenia    Spiradenoma     Past Surgical History:  Procedure Laterality Date   ANTERIOR CERVICAL DECOMP/DISCECTOMY FUSION N/A 05/17/2017   Procedure: C4-5/5-6 ACDF;  Surgeon: Onetha Kuba, MD;  Location: [facility] OR;  Service: Neurosurgery;  Laterality: N/A;   BREAST LUMPECTOMY WITH RADIOACTIVE SEED LOCALIZATION Left 09/22/2023   Procedure: LEFT BREAST RADIOACTIVE SEED LOCALIZED LUMPECTOMY;  Surgeon: Curvin Deward MOULD, MD;  Location: [facility] OR;  Service: General;  Laterality: Left;   CARPAL TUNNEL RELEASE Right 07/2017   CATARACT EXTRACTION Bilateral 2021   SPINE SURGERY  05/17/2017    Current Outpatient Medications  Medication Instructions   Accu-Chek Softclix Lancets lancets Check blood sugars once daily   anastrozole  (ARIMIDEX ) 1 mg, Oral, Daily   atorvastatin  (LIPITOR) 40 mg, Oral, Daily at bedtime   Blood Glucose Monitoring Suppl (ACCU-CHEK GUIDE ME) w/Device KIT Check blood sugars once daily   Calcium  Carb-Cholecalciferol (CALCIUM  500+D PO) 1 tablet, 2  times daily   Calcium -Magnesium-Vitamin D  500-250-125 MG-MG-UNIT TABS 1 tablet Orally Twice a day for 30 day(s)   Cyanocobalamin (B-12) 5000 MCG CAPS 1 tablet, Daily   diphenhydramine -acetaminophen  (TYLENOL  PM) 25-500 MG TABS tablet 1 tablet, Daily at bedtime   dorzolamide -timolol  (COSOPT ) 2-0.5 % ophthalmic solution 1 drop, Both Eyes, 2 times daily   GLUCOSAMINE-CHONDROITIN PO 1 tablet, Daily   glucose blood (ACCU-CHEK GUIDE TEST) test strip Check blood sugars once daily   latanoprost  (XALATAN ) 0.005 % ophthalmic solution Place 1 drop into both eyes every morning.   lisinopril -hydrochlorothiazide  (ZESTORETIC ) 10-12.5 MG tablet 2 tablets, Oral, Daily   metFORMIN  (GLUCOPHAGE ) 500 mg, Oral, 3 times daily with meals   Multiple Vitamin (MULTIVITAMIN WITH MINERALS) TABS tablet 1 tablet, Daily   pantoprazole  (PROTONIX ) 40 mg, Oral, Daily       Objective:   Physical Exam BP 122/80   Pulse 70   Temp 97.8 F (36.6 C) (Oral)   Resp 16   Ht 5' (1.524 m)   Wt 127 lb 6 oz (57.8 kg)   SpO2 97%   BMI 24.88 kg/m  General: Well developed, NAD, BMI noted Neck: No  thyromegaly  HEENT:  Normocephalic . Face symmetric, atraumatic Lungs:  CTA B Normal respiratory effort, no intercostal retractions, no accessory muscle use. Heart: RRR,  no murmur.  Abdomen:  Not distended, soft, non-tender. No rebound or rigidity.   DM foot  exam: No edema, good pedal pulses, pinprick examination normal Skin: Exposed areas without rash. Not pale. Not jaundice Neurologic:  alert & oriented X3.  Speech normal, gait appropriate for age and unassisted Strength symmetric and appropriate for age.  Psych: Cognition and judgment appear intact.  Cooperative with normal attention span and concentration.  Behavior appropriate. No anxious or depressed appearing.     Assessment      Assessment Diabetes 2014 HTN Hyperlipidemia Anxiety DJD GERD Osteopenia; dexa 07-2016, 2021 wnl.   Ao US  (-)  AAA 2010 Cervical  myelopathy, s/p surgery 05-2017 CTS s/p R surgery 2018 Glaucoma, early macular degeneration Breast cancer: Dx 08-2023, L lumpectomy, anastrozole   PLAN Here   for CPX - Tdap 2020 - Pneumonia 2015; prevnar 2016; PNM 20: 2023 - s/p  Zostavax ; s/p shingrix x 2  -Vaccines I recommend:   Flu shot this fall, COVID booster , RSV. Female care  Cervical cancer screening no longer indicated. History of breast cancer Dx 08/2023, on anastrozole .  Last mammogram 08/25/2023 [facility]  pt elected no further screening --Labs:   BMP AST ALT FLP CBC A1c - ACP documents on file  Other issues addressed DM: On metformin , ambulatory CBGs 88-90.  Checking A1c. Neuropathy?  Have some sensitivity on her feet, exam is negative.  Unclear if this is neuropathy, rec trial w/ capsaicin,  check a B12.   HTN: Zestoretic , ambulatory BPs in the 120s.  Checking labs. High cholesterol: Atorvastatin .  Check FLP AST ALT Anxiety: Admits to some stress, mostly related to one of her daughters.  Listening therapy provided. History of osteopenia: DEXA 11/2023, WNL.  Next in 2 years Breast cancer: LOV oncology 08/01/2024.  Current treatment: Anastrozole .  Next visit 1 year RTC 6 months.  Sooner if needed.

## 2024-08-07 NOTE — Assessment & Plan Note (Signed)
 Here   for CPX - Tdap 2020 - Pneumonia 2015; prevnar 2016; PNM 20: 2023 - s/p  Zostavax ; s/p shingrix x 2  -Vaccines I recommend:   Flu shot this fall, COVID booster , RSV. Female care  Cervical cancer screening no longer indicated. History of breast cancer Dx 08/2023, on anastrozole .  Last mammogram 08/25/2023 [facility]  pt elected no further screening --Labs:   BMP AST ALT FLP CBC A1c - [facility] documents on file

## 2024-08-09 ENCOUNTER — Ambulatory Visit: Payer: Self-pay | Admitting: Internal Medicine

## 2024-08-25 LAB — HM MAMMOGRAPHY

## 2024-09-03 ENCOUNTER — Other Ambulatory Visit: Payer: Self-pay | Admitting: Internal Medicine

## 2024-09-11 ENCOUNTER — Other Ambulatory Visit (HOSPITAL_BASED_OUTPATIENT_CLINIC_OR_DEPARTMENT_OTHER): Payer: Self-pay

## 2024-09-11 MED ORDER — FLUZONE HIGH-DOSE 0.5 ML IM SUSY
0.5000 mL | PREFILLED_SYRINGE | Freq: Once | INTRAMUSCULAR | 0 refills | Status: AC
  Filled 2024-09-11: qty 0.5, 1d supply, fill #0

## 2024-09-25 ENCOUNTER — Other Ambulatory Visit (HOSPITAL_BASED_OUTPATIENT_CLINIC_OR_DEPARTMENT_OTHER): Payer: Self-pay

## 2024-09-25 MED ORDER — COMIRNATY 30 MCG/0.3ML IM SUSY
0.3000 mL | PREFILLED_SYRINGE | Freq: Once | INTRAMUSCULAR | 0 refills | Status: AC
  Filled 2024-09-25: qty 0.3, 1d supply, fill #0

## 2024-10-09 ENCOUNTER — Other Ambulatory Visit (HOSPITAL_BASED_OUTPATIENT_CLINIC_OR_DEPARTMENT_OTHER): Payer: Self-pay

## 2024-10-10 ENCOUNTER — Other Ambulatory Visit (HOSPITAL_BASED_OUTPATIENT_CLINIC_OR_DEPARTMENT_OTHER): Payer: Self-pay

## 2024-10-10 ENCOUNTER — Other Ambulatory Visit: Payer: Self-pay | Admitting: Hematology and Oncology

## 2024-10-10 MED ORDER — ANASTROZOLE 1 MG PO TABS
1.0000 mg | ORAL_TABLET | Freq: Every day | ORAL | 3 refills | Status: AC
  Filled 2024-10-10: qty 90, 90d supply, fill #0
  Filled 2025-01-03: qty 90, 90d supply, fill #1

## 2024-11-06 ENCOUNTER — Other Ambulatory Visit (HOSPITAL_BASED_OUTPATIENT_CLINIC_OR_DEPARTMENT_OTHER): Payer: Self-pay

## 2024-11-16 ENCOUNTER — Other Ambulatory Visit: Payer: Self-pay | Admitting: Internal Medicine

## 2024-12-14 ENCOUNTER — Other Ambulatory Visit: Payer: Self-pay | Admitting: Internal Medicine

## 2025-02-07 ENCOUNTER — Ambulatory Visit: Admitting: Internal Medicine

## 2025-06-01 ENCOUNTER — Ambulatory Visit

## 2025-08-01 ENCOUNTER — Ambulatory Visit: Admitting: Hematology and Oncology
